# Patient Record
Sex: Male | Born: 1950 | Race: White | Hispanic: Refuse to answer | Marital: Single | Smoking: Current every day smoker
Health system: Southern US, Community
[De-identification: ages and names within clinical notes are randomized; demographics above are authoritative.]

## PROBLEM LIST (undated history)

## (undated) DIAGNOSIS — I4891 Unspecified atrial fibrillation: Secondary | ICD-10-CM

## (undated) DIAGNOSIS — K649 Unspecified hemorrhoids: Secondary | ICD-10-CM

## (undated) DIAGNOSIS — T783XXA Angioneurotic edema, initial encounter: Secondary | ICD-10-CM

## (undated) DIAGNOSIS — H8110 Benign paroxysmal vertigo, unspecified ear: Secondary | ICD-10-CM

## (undated) DIAGNOSIS — E785 Hyperlipidemia, unspecified: Secondary | ICD-10-CM

## (undated) DIAGNOSIS — M5430 Sciatica, unspecified side: Secondary | ICD-10-CM

## (undated) DIAGNOSIS — I2510 Atherosclerotic heart disease of native coronary artery without angina pectoris: Secondary | ICD-10-CM

## (undated) DIAGNOSIS — I10 Essential (primary) hypertension: Secondary | ICD-10-CM

## (undated) DIAGNOSIS — I219 Acute myocardial infarction, unspecified: Secondary | ICD-10-CM

## (undated) DIAGNOSIS — R0600 Dyspnea, unspecified: Secondary | ICD-10-CM

## (undated) HISTORY — DX: Unspecified hemorrhoids: K64.9

## (undated) HISTORY — PX: BACK SURGERY: SHX140

## (undated) HISTORY — DX: Sciatica, unspecified side: M54.30

## (undated) HISTORY — DX: Hyperlipidemia, unspecified: E78.5

## (undated) HISTORY — PX: SHOULDER SURGERY: SHX246

## (undated) HISTORY — DX: Benign paroxysmal vertigo, unspecified ear: H81.10

---

## 2003-06-08 ENCOUNTER — Emergency Department (HOSPITAL_COMMUNITY): Admission: EM | Admit: 2003-06-08 | Discharge: 2003-06-08 | Payer: Self-pay | Admitting: Emergency Medicine

## 2003-06-29 ENCOUNTER — Observation Stay (HOSPITAL_COMMUNITY): Admission: RE | Admit: 2003-06-29 | Discharge: 2003-06-30 | Payer: Self-pay | Admitting: Orthopedic Surgery

## 2004-11-10 ENCOUNTER — Encounter: Admission: RE | Admit: 2004-11-10 | Discharge: 2004-11-10 | Payer: Self-pay | Admitting: Orthopaedic Surgery

## 2004-12-09 ENCOUNTER — Encounter: Admission: RE | Admit: 2004-12-09 | Discharge: 2004-12-09 | Payer: Self-pay | Admitting: Family Medicine

## 2004-12-25 ENCOUNTER — Ambulatory Visit: Payer: Self-pay

## 2005-01-02 ENCOUNTER — Ambulatory Visit: Payer: Self-pay | Admitting: Internal Medicine

## 2005-01-14 ENCOUNTER — Ambulatory Visit (HOSPITAL_COMMUNITY): Admission: RE | Admit: 2005-01-14 | Discharge: 2005-01-14 | Payer: Self-pay | Admitting: Neurology

## 2005-02-27 ENCOUNTER — Encounter: Admission: RE | Admit: 2005-02-27 | Discharge: 2005-02-27 | Payer: Self-pay | Admitting: Internal Medicine

## 2005-04-02 ENCOUNTER — Inpatient Hospital Stay (HOSPITAL_COMMUNITY): Admission: RE | Admit: 2005-04-02 | Discharge: 2005-04-04 | Payer: Self-pay | Admitting: Orthopaedic Surgery

## 2009-06-03 ENCOUNTER — Ambulatory Visit: Payer: Self-pay | Admitting: Cardiology

## 2009-06-03 ENCOUNTER — Inpatient Hospital Stay (HOSPITAL_COMMUNITY): Admission: EM | Admit: 2009-06-03 | Discharge: 2009-06-08 | Payer: Self-pay | Admitting: Emergency Medicine

## 2009-06-06 ENCOUNTER — Encounter (INDEPENDENT_AMBULATORY_CARE_PROVIDER_SITE_OTHER): Payer: Self-pay | Admitting: Internal Medicine

## 2010-09-23 LAB — POCT I-STAT, CHEM 8
BUN: 11 mg/dL (ref 6–23)
Calcium, Ion: 1.14 mmol/L (ref 1.12–1.32)
Chloride: 103 mEq/L (ref 96–112)
Creatinine, Ser: 0.8 mg/dL (ref 0.4–1.5)
Glucose, Bld: 122 mg/dL — ABNORMAL HIGH (ref 70–99)
HCT: 47 % (ref 39.0–52.0)
Hemoglobin: 16 g/dL (ref 13.0–17.0)
Potassium: 3.1 mEq/L — ABNORMAL LOW (ref 3.5–5.1)
Sodium: 142 mEq/L (ref 135–145)
TCO2: 28 mmol/L (ref 0–100)

## 2010-09-23 LAB — BASIC METABOLIC PANEL
BUN: 9 mg/dL (ref 6–23)
CO2: 27 mEq/L (ref 19–32)
CO2: 28 mEq/L (ref 19–32)
Calcium: 8.5 mg/dL (ref 8.4–10.5)
Chloride: 107 mEq/L (ref 96–112)
Chloride: 109 mEq/L (ref 96–112)
Creatinine, Ser: 0.6 mg/dL (ref 0.4–1.5)
GFR calc Af Amer: >60 mL/min (ref >60)
GFR calc non Af Amer: >60 mL/min (ref >60)
Glucose, Bld: 102 mg/dL — ABNORMAL HIGH (ref 70–99)
Glucose, Bld: 104 mg/dL — ABNORMAL HIGH (ref 70–99)
Potassium: 3.3 mEq/L — ABNORMAL LOW (ref 3.5–5.1)
Potassium: 3.8 mEq/L (ref 3.5–5.1)
Sodium: 140 mEq/L (ref 135–145)
Sodium: 142 mEq/L (ref 135–145)

## 2010-09-23 LAB — CBC
HCT: 40.7 % (ref 39.0–52.0)
HCT: 42.6 % (ref 39.0–52.0)
HCT: 43.8 % (ref 39.0–52.0)
Hemoglobin: 13.8 g/dL (ref 13.0–17.0)
Hemoglobin: 14.5 g/dL (ref 13.0–17.0)
Hemoglobin: 14.8 g/dL (ref 13.0–17.0)
MCHC: 33.8 g/dL (ref 30.0–36.0)
MCHC: 33.8 g/dL (ref 30.0–36.0)
MCHC: 34 g/dL (ref 30.0–36.0)
MCV: 88.4 fL (ref 78.0–100.0)
MCV: 88.7 fL (ref 78.0–100.0)
MCV: 88.8 fL (ref 78.0–100.0)
Platelets: 218 10*3/uL (ref 150–400)
Platelets: 245 10*3/uL (ref 150–400)
RBC: 4.8 MIL/uL (ref 4.22–5.81)
RBC: 4.93 MIL/uL (ref 4.22–5.81)
RDW: 12.9 % (ref 11.5–15.5)
RDW: 13.1 % (ref 11.5–15.5)
RDW: 13.2 % (ref 11.5–15.5)
WBC: 7.4 10*3/uL (ref 4.0–10.5)
WBC: 8.4 10*3/uL (ref 4.0–10.5)

## 2010-09-23 LAB — CARDIAC PANEL(CRET KIN+CKTOT+MB+TROPI)
CK, MB: 1.3 ng/mL (ref 0.3–4.0)
CK, MB: 1.6 ng/mL (ref 0.3–4.0)
CK, MB: 1.7 ng/mL (ref 0.3–4.0)
Relative Index: INVALID (ref 0.0–2.5)
Relative Index: INVALID (ref 0.0–2.5)
Relative Index: INVALID (ref 0.0–2.5)
Total CK: 66 U/L (ref 7–232)
Total CK: 70 U/L (ref 7–232)
Total CK: 78 U/L (ref 7–232)
Troponin I: 0.01 ng/mL (ref 0.00–0.06)
Troponin I: 0.02 ng/mL (ref 0.00–0.06)
Troponin I: 0.02 ng/mL (ref 0.00–0.06)

## 2010-09-23 LAB — POCT CARDIAC MARKERS
CKMB, poc: 1.5 ng/mL (ref 1.0–8.0)
CKMB, poc: <1 ng/mL — ABNORMAL LOW (ref 1.0–8.0)
Myoglobin, poc: 63.6 ng/mL (ref 12–200)
Myoglobin, poc: 79 ng/mL (ref 12–200)
Troponin i, poc: <0.05 ng/mL (ref 0.00–0.09)
Troponin i, poc: <0.05 ng/mL (ref 0.00–0.09)

## 2010-09-23 LAB — LIPID PANEL
Cholesterol: 161 mg/dL (ref 0–200)
HDL: 44 mg/dL (ref >39)
LDL Cholesterol: 102 mg/dL — ABNORMAL HIGH (ref 0–99)
Total CHOL/HDL Ratio: 3.7 RATIO
Triglycerides: 74 mg/dL (ref <150)
VLDL: 15 mg/dL (ref 0–40)

## 2010-09-23 LAB — DIFFERENTIAL
Basophils Absolute: 0 10*3/uL (ref 0.0–0.1)
Basophils Relative: 0 % (ref 0–1)
Eosinophils Absolute: 0.2 10*3/uL (ref 0.0–0.7)
Eosinophils Relative: 2 % (ref 0–5)
Lymphocytes Relative: 7 % — ABNORMAL LOW (ref 12–46)
Lymphs Abs: 0.6 10*3/uL — ABNORMAL LOW (ref 0.7–4.0)
Monocytes Absolute: 0.4 10*3/uL (ref 0.1–1.0)
Monocytes Relative: 5 % (ref 3–12)
Neutro Abs: 7.2 10*3/uL (ref 1.7–7.7)
Neutrophils Relative %: 86 % — ABNORMAL HIGH (ref 43–77)

## 2010-09-23 LAB — ETHANOL: Alcohol, Ethyl (B): <5 mg/dL (ref 0–10)

## 2010-11-07 NOTE — H&P (Signed)
NAME:  Connor Martinez, Connor Martinez NO.:  [id number]   MEDICAL RECORD NO.:  [id number]          PATIENT TYPE:  AMB   LOCATION:  SDS                          FACILITY:  [facility]   PHYSICIAN:  Sharolyn Douglas, M.D.        DATE OF BIRTH:  1951-01-31   DATE OF ADMISSION:  04/02/2005  DATE OF DISCHARGE:                                HISTORY & PHYSICAL   This history and physical was performed in our office on April 01, 2005,  at which time the patient was fitted with a soft cervical collar, which will  be used postoperatively.   CHIEF COMPLAINT:  Pain and weakness in my left arm.Marland Kitchen   PRESENT ILLNESS:  This 60 year old white male was seen by [place] for continued  progressive problems concerning pain into his cervical spine, but most  significant to the patient weakness in his left upper extremity.  He has a  drop in the ring finger on the left hand, and interosseous muscles are  atrophied, as well as his triceps on the left.  He cannot extend the ring  finger of his left hand.  With high suspicious of a pancoast tumor, which  might be impinging upon the brachial plexus, he was sent to see Dr. Melbourne Abts  for full evaluation.  The EMGs Dr. Sandria Manly performed show cervical  radiculopathy without evidence of peripheral neuropathy.  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  After being cleared by Dr.  Sandria Manly, and all the risks and benefits of surgery described to the patient, it  was felt he would benefit from surgical intervention and being admitted for  anterior cervical diskectomy and fusion from C3 to C7.   PAST MEDICAL HISTORY:  The patient has been in relatively good health under  the care of Dr. Duaine Dredge.  He had a rotator cuff repair to the right  shoulder in January of 2005 by Dr. Simonne Come with excellent results.   FAMILY HISTORY:  Positive for heart disease and a stroke in his mother.   SOCIAL HISTORY:  The patient is single.  He is an Lexicographer.  He  smokes about a pack of cigarettes per day.  He has 1 daughter, and brothers  and daughter will be caregivers after surgery.   REVIEW OF SYSTEMS:  CNS:  No seizure, shoulder paralysis, double vision, but  the patient does have numbness, tingling, and weakness as mentioned above,  in his left upper extremity.  CARDIOVASCULAR:  No chest pain, no angina or  orthopnea.  RESPIRATORY:  No productive cough.  No hemoptysis.  No shortness  of breath.  GASTROINTESTINAL:  No nausea, vomiting, melena, or bloody  stools.  GENITOURINARY:  No discharge or hematuria.  MUSCULOSKELETAL:  Primarily in the present illness.   PHYSICAL EXAMINATION:  GENERAL:  Alert, cooperative, friendly 60 year old  white male.  VITAL SIGNS:  Blood pressure 120/80, pulse 74, respirations 20.  HEENT:  Normocephalic.  Pupils equal, round and reactive to light  and  accommodation.  Extraocular movements intact.  Oropharynx is clear.  CHEST:  Clear to auscultation.  No rhonchi, no rales, no wheezes.  HEART:  Regular rate and rhythm.  There was a grade 3/6 holosystolic murmur  heard across the chest.  ABDOMEN:  Soft, nontender.  Liver and spleen not felt.  GENITALIA/RECTAL:  Not done.  Not pertinent to present illness.  EXTREMITIES:  The patient does, indeed, have weakness in the left upper  extremity, as well as the ring finger.  He has hypoactive reflexes, as well.   ADMITTING DIAGNOSIS:  Cervical spondylotic radiculopathy.   PLAN:  The patient will undergo anterior cervical diskectomy and fusion at 4  levels, C3 through C7, with allograft and plate.  The patient has been  cleared preoperative by Dr. Duaine Dredge for this surgical procedure.  I  discussed with him at length about the benefits of cessation of smoking  during the healing phase, as there is a possibility of non-union of his  grafts should he continue the pack a day that he does at this  present time.      Dooley L. Cherlynn June.      Sharolyn Douglas, M.D.  Electronically Signed    DLU/MEDQ  D:  04/01/2005  T:  04/01/2005  Job:  [id number]   cc:   Mosetta Putt, M.D.  Fax: [phone]

## 2010-11-07 NOTE — Op Note (Signed)
NAME:  Connor Martinez, Connor Martinez NO.:  [id number]   MEDICAL RECORD NO.:  [id number]          PATIENT TYPE:  OIB   LOCATION:  5029                         FACILITY:  [facility]   PHYSICIAN:  Sharolyn Douglas, M.D.        DATE OF BIRTH:  1951/02/19   DATE OF PROCEDURE:  04/02/2005  DATE OF DISCHARGE:                                 OPERATIVE REPORT   DIAGNOSIS:  Cervical spondylotic radiculopathy.   PROCEDURE:  1.  Anterior cervical diskectomy, C3-4, C4-5, C5-6 and C6-7, with      decompression of the spinal cord and nerve roots bilaterally.  2.  Anterior cervical arthrodesis, 4 levels, C3 through C7, with placement      of 4 allograft prosthesis spacers packed with local autogenous bone      graft.  3.  Anterior cervical plating, C3 through C7, using the Abbott Spine System.   SURGEON:  Sharolyn Douglas, MD   ASSISTANT:  Verlin Fester, PA   ANESTHESIA:  General endotracheal.   ESTIMATED BLOOD LOSS:  Minimal.   COMPLICATIONS:  None.   INDICATIONS:  The patient is a pleasant 60 year old male with severe left  upper extremity radiculopathy and weakness.  His imaging studies show  multilevel disk disease with spinal stenosis and foraminal narrowing most  advanced, C3 through C7.  He has had an extensive workup, ruling out  extraspinal causes of his profound left upper extremity weakness and this  has been negative.  At this time, he presents for decompression and fusion  of his cervical spine in hopes of preventing further neurologic deficit and  improving his symptoms.  Risk and benefits were reviewed; he elected to  proceed.   PROCEDURE:  The patient was taken to the operating room, underwent general  endotracheal anesthesia without difficulty, given prophylactic IV  antibiotics.  Carefully positioned on the operating room table with his neck  in slight extension.  Five pounds of halter traction applied.  The neck was  prepped and draped in the usual sterile fashion.  An extensile  longitudinal  oblique incision was made along the medial border of the sternocleidomastoid  muscle.  Dissection was carried sharply through the platysma.  The interval  between the SCM and strap muscles was developed down to the prevertebral  space.  The anterior cervical spine was exposed.  A spinal needle was placed  to confirm location with intraoperative x-ray.  The longus coli muscle was  elevated out over the disk spaces of C3 through C7.  There were large  osteophytes deforming the anterior surface of the cervical spine.  The  esophagus, trachea and carotid sheath were identified and protected at all  times.  Deep Shadowline retractor placed.  The microscope was draped and  brought into the field.  Caspar distraction pins were placed into the C3, 4,  5, 6 and 7 vertebral bodies.  Starting at C6-7, gentle distraction was  applied on the Caspar distraction pins and radical diskectomy carried back  to the posterior longitudinal ligament.  The disk space was very  degenerative and the uncovertebral  joints were hypertrophied.  A high-speed  bur was used to take down the posterior vertebral margins and uncovertebral  joints.  The posterior longitudinal ligament was removed using the micro  Kerrisons.  A 6-mm allograft prosthesis spacer was then packed with local  bone graft obtained from the drill shavings and carefully countersunk into  the interspace 1 mm.  We then performed a similar procedure at C5-6, C4-5  and C3-4.  At the C3-4 level, there was a spondylolisthesis and at this  level, a 7-mm allograft prosthesis spacer was packed with bone graft and  inserted.  Hemostasis was achieved.  We then placed a 4-level anterior  cervical plate from C3 to C7 using 10 locking screws.  The 12-mm screws were  used in the 4 proximal holes. The 13-mm screws were used distally.  We had  excellent screw purchase.  We ensured that the locking mechanism engaged.  The wound was irrigated; the esophagus,  trachea and carotid sheath were  evaluated; there were no apparent injuries.  Deep Penrose drain was left in  place.  The platysma was closed with a running #2-0 Vicryl suture, the  subcutaneous layer closed with 3-0 Vicryl suture followed by a running 4-0  subcuticular Vicryl suture on the skin edges, benzoin and Steri-Strips  applied.  Sterile dressing placed.  The patient was placed into a cervical  collar, extubated without difficulty and transferred to Recovery in stable  condition, able to move his upper and lower extremities.      Sharolyn Douglas, M.D.  Electronically Signed     MC/MEDQ  D:  04/02/2005  T:  04/03/2005  Job:  [id number]

## 2010-11-07 NOTE — Op Note (Signed)
NAME:  Connor Martinez, Connor Martinez                           ACCOUNT NO.:  [id number]   MEDICAL RECORD NO.:  [id number]                   PATIENT TYPE:  AMB   LOCATION:  DAY                                  FACILITY:  [facility]   PHYSICIAN:  Marlowe Kays, M.D.               DATE OF BIRTH:  Feb 03, 1951   DATE OF PROCEDURE:  06/29/2003  DATE OF DISCHARGE:                                 OPERATIVE REPORT   PREOPERATIVE DIAGNOSIS:  Torn rotator cuff, right shoulder.   POSTOPERATIVE DIAGNOSIS:  Torn rotator cuff, right shoulder.   OPERATION:  Anterior acromionectomy, repair of torn rotator cuff, right  shoulder.   SURGEON:  Illene Labrador. [place], M.D.   ASSISTANT:  Mr. Idolina Primer, P.A.-C.   ANESTHESIA:  General.   PATHOLOGY AND JUSTIFICATION FOR PROCEDURE:  He had an injury to his right  arm on June 06, 2003, because of severe pain, and he was sent for an MRI  which demonstrated a 2.5 cm retracted full-thickness rotator cuff tear and  accordingly, he is here today for the above-mentioned surgery.  Operative  findings confirmed the MRI.   DESCRIPTION OF PROCEDURE:  Prophylactic antibiotics, satisfactory general  anesthesia, beach chair position on the Schlein frame.  The right shoulder  girdle was prepped with DuraPrep, draped in a sterile field, Ioban employed.  Vertical incision from roughly the [employer] joint down the greater tuberosity.  Fascia was opened in line with the skin incision.  Cut and cautery off the  anterior acromion to expose the [facility] joint which was identified with a Mellody Dance  needle.  With some difficulty, I was able to place a small Cobb elevator  beneath the anterior acromion.  He had a very severe impingement problem.  Followed this with a larger Cobb elevator and then protecting the underlying  structures, made my initial anterior acromionectomy with a micro saw.  I  then performed several other passes with the saw and small rongeur to  perform a wide decompression.  He also had a  very thick fascial layer  beneath this which was creating impingement problem as well.  This was  opened and partially excised.  The triangular-shaped tear was then  identified.  I roughened up the lateral humeral head area near the greater  tuberosity and placed two double-threaded Mitek anchors so that I was able  to get four good sutures through the full-thickness rotator cuff and with  the arm abducted, reattached it laterally.  I then supplemented this with  multiple interrupted #1 Ethibond sutures on the ends of the rotator cuff  laterally, so there was a nice smooth repair, doubly reinforced.  He had had  a partial result of an interscalene block preoperatively, and I supplemented  this with 0.5% Marcaine with adrenalin.  The deltoid muscle was  reapproximated with interrupted #1 Vicryl in two layers.  The fascia over  the anterior acromion with a good,  stable repair in one layer.  The  subcutaneous tissue was closed with 2-0 Vicryl, Steri-Strips on the skin,  dry sterile dressings applied followed by shoulder immobilizer.  He  tolerated the procedure well and was taken to the recovery room in  satisfactory condition with no known complications.                                              Marlowe Kays, M.D.   JA/MEDQ  D:  06/29/2003  T:  06/29/2003  Job:  [id number]

## 2011-04-13 ENCOUNTER — Other Ambulatory Visit: Payer: Self-pay | Admitting: Family Medicine

## 2011-04-13 DIAGNOSIS — R200 Anesthesia of skin: Secondary | ICD-10-CM

## 2011-04-13 DIAGNOSIS — M545 Low back pain: Secondary | ICD-10-CM

## 2011-04-17 ENCOUNTER — Ambulatory Visit
Admission: RE | Admit: 2011-04-17 | Discharge: 2011-04-17 | Disposition: A | Discharge status: Home / Self Care | Payer: 59 | Source: Ambulatory Visit | Attending: Family Medicine | Admitting: Family Medicine

## 2011-04-17 DIAGNOSIS — M545 Low back pain, unspecified: Secondary | ICD-10-CM

## 2011-04-17 DIAGNOSIS — R200 Anesthesia of skin: Secondary | ICD-10-CM

## 2011-04-17 MED ORDER — GADOBENATE DIMEGLUMINE 529 MG/ML IV SOLN
17.0000 mL | Freq: Once | INTRAVENOUS | Status: AC | PRN
  Administered 2011-04-17: 17 mL via INTRAVENOUS

## 2011-05-26 LAB — HM COLONOSCOPY

## 2015-11-26 ENCOUNTER — Encounter: Payer: Self-pay | Admitting: Internal Medicine

## 2015-11-26 ENCOUNTER — Ambulatory Visit (INDEPENDENT_AMBULATORY_CARE_PROVIDER_SITE_OTHER): Payer: 59 | Admitting: Internal Medicine

## 2015-11-26 VITALS — BP 220/124 | HR 95 | Temp 99.3°F | Resp 16 | Ht 68.0 in | Wt 222.0 lb

## 2015-11-26 DIAGNOSIS — Z0000 Encounter for general adult medical examination without abnormal findings: Secondary | ICD-10-CM

## 2015-11-26 DIAGNOSIS — Z1159 Encounter for screening for other viral diseases: Secondary | ICD-10-CM

## 2015-11-26 DIAGNOSIS — Z114 Encounter for screening for human immunodeficiency virus [HIV]: Secondary | ICD-10-CM

## 2015-11-26 DIAGNOSIS — F17200 Nicotine dependence, unspecified, uncomplicated: Secondary | ICD-10-CM | POA: Insufficient documentation

## 2015-11-26 DIAGNOSIS — I10 Essential (primary) hypertension: Secondary | ICD-10-CM | POA: Diagnosis not present

## 2015-11-26 DIAGNOSIS — E669 Obesity, unspecified: Secondary | ICD-10-CM

## 2015-11-26 MED ORDER — ASPIRIN EC 325 MG PO TBEC: 325.0000 mg | DELAYED_RELEASE_TABLET | Freq: Two times a day (BID) | ORAL | Status: DC

## 2015-11-26 MED ORDER — LISINOPRIL 10 MG PO TABS: 10.0000 mg | ORAL_TABLET | Freq: Every day | ORAL | Status: DC

## 2015-11-26 NOTE — Assessment & Plan Note (Signed)
Increase exercise, decrease portion Stressed importance of weight loss

## 2015-11-26 NOTE — Patient Instructions (Signed)
  Test(s) ordered today. Your results will be released to [place] (or called to you) after review, usually within 72hours after test completion. If any changes need to be made, you will be notified at that same time.   Medications reviewed and updated.  Changes include starting a blood pressure medication called lisinopril.     Your prescription(s) have been submitted to your pharmacy. Please take as directed and contact our office if you believe you are having problem(s) with the medication(s).   Please followup in 1-2 months

## 2015-11-26 NOTE — Assessment & Plan Note (Signed)
Not controlled Discussed that he needs medication - he is unsure what he has taken in the past Start lisinopril 10 mg daily, discussed possible side effect of cough Stressed regular exercise and weight loss Start monitoring his BP at home Check all basic labs Follow up in 1-2 months, sooner if needed

## 2015-11-26 NOTE — Assessment & Plan Note (Signed)
Stressed smoking cessation - he is working on it and knows how important it is to stop

## 2015-11-26 NOTE — Progress Notes (Signed)
Pre visit review using our clinic review tool, if applicable. No additional management support is needed unless otherwise documented below in the visit note. 

## 2015-11-26 NOTE — Progress Notes (Signed)
Subjective:    Patient ID: Connor Martinez, male    DOB: July 30, 1950, 65 y.o.   MRN: [id number]  HPI He is here to establish with a new pcp.     He takes two,sometimes more, aspirin a day for pain or "just to relax".  He is smoking and wants to quit.    Hypertension: He is not taking his medication daily. He does not think he needed it, which is why he stopped it.  He feels his BP is ok outside of a doctors office, but admits it has been this high when he has checked it on his own.  He is fairly compliant with a low sodium diet.  He denies chest pain, palpitations, shortness of breath and regular headaches. He is not exercising regularly.  He does not monitor his blood pressure at home.    Lump in right upper arm:  He denies pain.  He noticed it about 8 months.  He is unsure if it has gotten bigger.   Skin abnormality on left lower leg: he has had a skin abnormality for years.  It has not changed.  He does pick at it at times.    Medications and allergies reviewed with patient and updated if appropriate.  Patient Active Problem List   Diagnosis Date Noted  . Tobacco use disorder 11/26/2015  . Essential hypertension, benign 11/26/2015    No current outpatient prescriptions on file prior to visit.   No current facility-administered medications on file prior to visit.    Past Medical History  Diagnosis Date  . Hyperlipidemia     Past Surgical History  Procedure Laterality Date  . Back surgery    . Shoulder surgery      Right    Social History   Social History  . Marital Status: Divorced    Spouse Name: N/A  . Number of Children: N/A  . Years of Education: N/A   Social History Main Topics  . Smoking status: Current Every Day Smoker  . Smokeless tobacco: Never Used  . Alcohol Use: Yes  . Drug Use: No  . Sexual Activity: Not Asked   Other Topics Concern  . None   Social History Narrative  . None    Family History  Problem Relation Age of Onset  . Stroke  Mother   . Hypertension Mother   . Heart disease Mother   . Hepatitis C Brother     Review of Systems  Constitutional: Negative for fever and chills.  Eyes: Negative for visual disturbance.  Respiratory: Negative for cough, shortness of breath and wheezing.   Cardiovascular: Positive for leg swelling. Negative for chest pain and palpitations.  Gastrointestinal: Negative for nausea, abdominal pain, diarrhea, constipation and blood in stool.       No gerd, occasional hemorrhoidal bleeding  Genitourinary: Positive for frequency. Negative for dysuria, hematuria and difficulty urinating.  Skin: Positive for color change.  Neurological: Negative for dizziness, light-headedness and headaches.  Psychiatric/Behavioral: Negative for dysphoric mood. The patient is not nervous/anxious.        Objective:   Filed Vitals:   11/26/15 1424  BP: 220/124  Pulse: 95  Temp: 99.3 F (37.4 C)  Resp: 16   Filed Weights   11/26/15 1424  Weight: 222 lb (100.699 kg)   Body mass index is 33.76 kg/(m^2).   Physical Exam Constitutional: He appears well-developed and well-nourished. No distress.  HENT:  Head: Normocephalic and atraumatic.  Right Ear: External ear normal.  Left Ear: External ear normal.  Mouth/Throat: Oropharynx is clear and moist.  Normal ear canals and TM b/l  Eyes: Conjunctivae normal.  Neck: Neck supple. No tracheal deviation present. No thyromegaly present.  No carotid bruit  Cardiovascular: Normal rate, regular rhythm, normal heart sounds and intact distal pulses.   No murmur heard. Pulmonary/Chest: Effort normal and breath sounds normal. No respiratory distress. He has no wheezes. He has no rales.  Abdominal: Soft. Ventral hernia. obese. He exhibits no distension. There is no tenderness.  Musculoskeletal: He exhibits trace edema.  Lymphadenopathy:    He has no cervical adenopathy.  Skin: Skin is warm and dry. He is not diaphoretic. mobile, nontender lump in right upper  arm consistent with a lipoma, red, scarred area on left lower anterior leg Psychiatric: He has a normal mood and affect. His behavior is normal.        Assessment & Plan:   See Problem List for Assessment and Plan of chronic medical problems.   F/u in 1-2 months

## 2016-01-17 ENCOUNTER — Ambulatory Visit: Payer: 59 | Admitting: Internal Medicine

## 2016-02-07 ENCOUNTER — Ambulatory Visit: Payer: 59 | Admitting: Internal Medicine

## 2016-02-10 ENCOUNTER — Encounter: Payer: Self-pay | Admitting: Internal Medicine

## 2016-02-10 DIAGNOSIS — L409 Psoriasis, unspecified: Secondary | ICD-10-CM | POA: Insufficient documentation

## 2016-03-18 ENCOUNTER — Other Ambulatory Visit (INDEPENDENT_AMBULATORY_CARE_PROVIDER_SITE_OTHER): Payer: 59

## 2016-03-18 DIAGNOSIS — Z0000 Encounter for general adult medical examination without abnormal findings: Secondary | ICD-10-CM | POA: Diagnosis not present

## 2016-03-18 DIAGNOSIS — Z114 Encounter for screening for human immunodeficiency virus [HIV]: Secondary | ICD-10-CM | POA: Diagnosis not present

## 2016-03-18 DIAGNOSIS — Z1159 Encounter for screening for other viral diseases: Secondary | ICD-10-CM | POA: Diagnosis not present

## 2016-03-18 DIAGNOSIS — I10 Essential (primary) hypertension: Secondary | ICD-10-CM | POA: Diagnosis not present

## 2016-03-18 LAB — COMPREHENSIVE METABOLIC PANEL
ALBUMIN: 3.7 g/dL (ref 3.5–5.2)
ALK PHOS: 78 U/L (ref 39–117)
ALT: 7 U/L (ref 0–53)
AST: 10 U/L (ref 0–37)
BILIRUBIN TOTAL: 0.8 mg/dL (ref 0.2–1.2)
BUN: 12 mg/dL (ref 6–23)
CALCIUM: 9 mg/dL (ref 8.4–10.5)
CO2: 25 mEq/L (ref 19–32)
Chloride: 102 mEq/L (ref 96–112)
Creatinine, Ser: 0.77 mg/dL (ref 0.40–1.50)
GFR: 108.1 mL/min (ref >60.00)
Glucose, Bld: 82 mg/dL (ref 70–99)
POTASSIUM: 3.1 meq/L — AB (ref 3.5–5.1)
Sodium: 139 mEq/L (ref 135–145)
TOTAL PROTEIN: 7.4 g/dL (ref 6.0–8.3)

## 2016-03-18 LAB — LIPID PANEL
CHOLESTEROL: 234 mg/dL — AB (ref 0–200)
HDL: 38.7 mg/dL — AB (ref >39.00)
LDL Cholesterol: 177 mg/dL — ABNORMAL HIGH (ref 0–99)
NonHDL: 195.53
TRIGLYCERIDES: 92 mg/dL (ref 0.0–149.0)
Total CHOL/HDL Ratio: 6
VLDL: 18.4 mg/dL (ref 0.0–40.0)

## 2016-03-18 LAB — CBC WITH DIFFERENTIAL/PLATELET
Basophils Absolute: 0 10*3/uL (ref 0.0–0.1)
Basophils Relative: 0.2 % (ref 0.0–3.0)
EOS PCT: 3.6 % (ref 0.0–5.0)
Eosinophils Absolute: 0.3 10*3/uL (ref 0.0–0.7)
HEMATOCRIT: 43 % (ref 39.0–52.0)
HEMOGLOBIN: 14.7 g/dL (ref 13.0–17.0)
LYMPHS ABS: 1.6 10*3/uL (ref 0.7–4.0)
LYMPHS PCT: 20.1 % (ref 12.0–46.0)
MCHC: 34.2 g/dL (ref 30.0–36.0)
MCV: 82.3 fl (ref 78.0–100.0)
MONOS PCT: 8.6 % (ref 3.0–12.0)
Monocytes Absolute: 0.7 10*3/uL (ref 0.1–1.0)
NEUTROS PCT: 67.5 % (ref 43.0–77.0)
Neutro Abs: 5.3 10*3/uL (ref 1.4–7.7)
Platelets: 324 10*3/uL (ref 150.0–400.0)
RBC: 5.23 Mil/uL (ref 4.22–5.81)
RDW: 14.6 % (ref 11.5–15.5)
WBC: 7.8 10*3/uL (ref 4.0–10.5)

## 2016-03-18 LAB — TSH: TSH: 0.77 u[IU]/mL (ref 0.35–4.50)

## 2016-03-18 LAB — HEMOGLOBIN A1C: HEMOGLOBIN A1C: 5.4 % (ref 4.6–6.5)

## 2016-03-20 LAB — HEPATITIS C ANTIBODY: HCV Ab: NEGATIVE

## 2016-03-20 LAB — PSA, TOTAL AND FREE
PSA, % FREE: 15 % — AB (ref >25)
PSA, FREE: 0.5 ng/mL
PSA, Total: 3.4 ng/mL (ref <=4.0)

## 2016-03-20 LAB — HIV ANTIBODY (ROUTINE TESTING W REFLEX): HIV: NONREACTIVE

## 2016-03-22 DIAGNOSIS — E785 Hyperlipidemia, unspecified: Secondary | ICD-10-CM | POA: Insufficient documentation

## 2016-03-22 NOTE — Progress Notes (Signed)
Subjective:    Patient ID: Connor Martinez, male    DOB: 03/19/1951, 65 y.o.   MRN: [id number]  HPI The patient is here for follow up.  Hypertension: He is taking his medication, but missed it a couple of days. He is not compliant with a low sodium diet.  He denies chest pain, palpitations, edema, shortness of breath and regular headaches. He is not exercising regularly.  He does not monitor his blood pressure at home, but does have a machine at home.   Hyperlipidemia: He is not on any medication, but he thinks he was on one in the past. He is not compliant with a low fat/cholesterol diet. He is not exercising regularly.   Smoking:  He is smoking and at this time does not want to quit.  Eventually he wants to quit, but wants to make other changes in his life first.      Medications and allergies reviewed with patient and updated if appropriate.  Patient Active Problem List   Diagnosis Date Noted  . Hyperlipidemia 03/22/2016  . Psoriasis 02/10/2016  . Tobacco use disorder 11/26/2015  . Essential hypertension, benign 11/26/2015  . Obese 11/26/2015    Current Outpatient Prescriptions on File Prior to Visit  Medication Sig Dispense Refill  . aspirin EC 325 MG tablet Take 1 tablet (325 mg total) by mouth 2 (two) times daily. 30 tablet 0  . lisinopril (PRINIVIL,ZESTRIL) 10 MG tablet Take 1 tablet (10 mg total) by mouth daily. 90 tablet 3   No current facility-administered medications on file prior to visit.     Past Medical History:  Diagnosis Date  . BPV (benign positional vertigo)   . Hemorrhoids   . Hyperlipidemia   . 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 BACK SURGERY    . SHOULDER SURGERY     Right    Social History   Social History  . Marital status: Divorced    Spouse name: N/A  . Number of children: N/A  . Years of education: N/A   Social History Main  Topics  . Smoking status: Current Every Day Smoker  . Smokeless tobacco: Never Used  . Alcohol use Yes  . Drug use: No  . Sexual activity: Not on file   Other Topics Concern  . Not on file   Social History Narrative  . No narrative on file    Family History  Problem Relation Age of Onset  . Stroke Mother   . Hypertension Mother   . Heart disease Mother   . Hepatitis C Brother   . Hypertension Brother     Review of Systems  Respiratory: Positive for cough (smoking related) and wheezing (smoking related). Negative for shortness of breath.   Cardiovascular: Negative for chest pain, palpitations and leg swelling.  Gastrointestinal: Negative for nausea.       No gerd  Neurological: Negative for light-headedness.       Objective:   Vitals:   03/23/16 0914  BP: (!) 220/120  Pulse: 81  Resp: 16  Temp: 98.8 F (37.1 C)   Filed Weights   03/23/16 0914  Weight: 216 lb (98 kg)   Body mass index is 32.84 kg/m.   Physical Exam    Constitutional: Appears well-developed and well-nourished. No distress.  HENT:  Head: Normocephalic and atraumatic.  Neck: Neck supple. No tracheal deviation  present. No thyromegaly present.  No cervical lymphadenopathy Cardiovascular: Normal rate, regular irregular rhythm and normal heart sounds.   1/6 systolic murmur heard. No carotid bruit .  No edema Pulmonary/Chest: Effort normal and breath sounds normal. No respiratory distress. No has no wheezes. No rales.  Skin: Skin is warm and dry. Not diaphoretic. hypertropic lesion on left lower leg - concerning for skin cancer Psychiatric: Normal mood and affect. Behavior is normal.     Assessment & Plan:    See Problem List for Assessment and Plan of chronic medical problems.   F/u in one month

## 2016-03-22 NOTE — Patient Instructions (Addendum)
Take only aspirin 81 mg a day for prevention of a heart attack or stroke.    Start exercising and work on weight loss.  Improve your diet.  Work on quitting smoking.  All other Health Maintenance issues reviewed.   All recommended immunizations and age-appropriate screenings are up-to-date or discussed.  No immunizations administered today.   Medications reviewed and updated.  Changes include stop lisinopril 10 mg daily and start amlodipine 5 mg daily and lisinopril hctz 20-12.5 mg daily.  Monitor your BP at home - goal is less than 150/90.   Your prescription(s) have been submitted to your pharmacy. Please take as directed and contact our office if you believe you are having problem(s) with the medication(s).   Please followup in 1 month    Hypertension Hypertension, commonly called high blood pressure, is when the force of blood pumping through your arteries is too strong. Your arteries are the blood vessels that carry blood from your heart throughout your body. A blood pressure reading consists of a higher number over a lower number, such as 110/72. The higher number (systolic) is the pressure inside your arteries when your heart pumps. The lower number (diastolic) is the pressure inside your arteries when your heart relaxes. Ideally you want your blood pressure below 120/80. Hypertension forces your heart to work harder to pump blood. Your arteries may become narrow or stiff. Having untreated or uncontrolled hypertension can cause heart attack, stroke, kidney disease, and other problems. RISK FACTORS Some risk factors for high blood pressure are controllable. Others are not.  Risk factors you cannot control include:   Race. You may be at higher risk if you are African American.  Age. Risk increases with age.  Gender. Men are at higher risk than women before age 75 years. After age 80, women are at higher risk than men. Risk factors you can control include:  Not getting enough  exercise or physical activity.  Being overweight.  Getting too much fat, sugar, calories, or salt in your diet.  Drinking too much alcohol. SIGNS AND SYMPTOMS Hypertension does not usually cause signs or symptoms. Extremely high blood pressure (hypertensive crisis) may cause headache, anxiety, shortness of breath, and nosebleed. DIAGNOSIS To check if you have hypertension, your health care provider will measure your blood pressure while you are seated, with your arm held at the level of your heart. It should be measured at least twice using the same arm. Certain conditions can cause a difference in blood pressure between your right and left arms. A blood pressure reading that is higher than normal on one occasion does not mean that you need treatment. If it is not clear whether you have high blood pressure, you may be asked to return on a different day to have your blood pressure checked again. Or, you may be asked to monitor your blood pressure at home for 1 or more weeks. TREATMENT Treating high blood pressure includes making lifestyle changes and possibly taking medicine. Living a healthy lifestyle can help lower high blood pressure. You may need to change some of your habits. Lifestyle changes may include:  Following the DASH diet. This diet is high in fruits, vegetables, and whole grains. It is low in salt, red meat, and added sugars.  Keep your sodium intake below 2,300 mg per day.  Getting at least 30-45 minutes of aerobic exercise at least 4 times per week.  Losing weight if necessary.  Not smoking.  Limiting alcoholic beverages.  Learning ways to  reduce stress. Your health care provider may prescribe medicine if lifestyle changes are not enough to get your blood pressure under control, and if one of the following is true:  You are 66-39 years of age and your systolic blood pressure is above 140.  You are 23 years of age or older, and your systolic blood pressure is above  150.  Your diastolic blood pressure is above 90.  You have diabetes, and your systolic blood pressure is over [id number] or your diastolic blood pressure is over 90.  You have kidney disease and your blood pressure is above 140/90.  You have heart disease and your blood pressure is above 140/90. Your personal target blood pressure may vary depending on your medical conditions, your age, and other factors. HOME CARE INSTRUCTIONS  Have your blood pressure rechecked as directed by your health care provider.   Take medicines only as directed by your health care provider. Follow the directions carefully. Blood pressure medicines must be taken as prescribed. The medicine does not work as well when you skip doses. Skipping doses also puts you at risk for problems.  Do not smoke.   Monitor your blood pressure at home as directed by your health care provider. SEEK MEDICAL CARE IF:   You think you are having a reaction to medicines taken.  You have recurrent headaches or feel dizzy.  You have swelling in your ankles.  You have trouble with your vision. SEEK IMMEDIATE MEDICAL CARE IF:  You develop a severe headache or confusion.  You have unusual weakness, numbness, or feel faint.  You have severe chest or abdominal pain.  You vomit repeatedly.  You have trouble breathing. MAKE SURE YOU:   Understand these instructions.  Will watch your condition.  Will get help right away if you are not doing well or get worse.   This information is not intended to replace advice given to you by your health care provider. Make sure you discuss any questions you have with your health care provider.   Document Released: 06/08/2005 Document Revised: 10/23/2014 Document Reviewed: 03/31/2013 Elsevier Interactive Patient Education [employer].

## 2016-03-23 ENCOUNTER — Ambulatory Visit (INDEPENDENT_AMBULATORY_CARE_PROVIDER_SITE_OTHER): Payer: 59 | Admitting: Internal Medicine

## 2016-03-23 ENCOUNTER — Encounter: Payer: Self-pay | Admitting: Internal Medicine

## 2016-03-23 VITALS — BP 220/120 | HR 81 | Temp 98.8°F | Resp 16 | Wt 216.0 lb

## 2016-03-23 DIAGNOSIS — E6609 Other obesity due to excess calories: Secondary | ICD-10-CM | POA: Diagnosis not present

## 2016-03-23 DIAGNOSIS — L989 Disorder of the skin and subcutaneous tissue, unspecified: Secondary | ICD-10-CM | POA: Diagnosis not present

## 2016-03-23 DIAGNOSIS — I10 Essential (primary) hypertension: Secondary | ICD-10-CM | POA: Diagnosis not present

## 2016-03-23 DIAGNOSIS — E7800 Pure hypercholesterolemia, unspecified: Secondary | ICD-10-CM

## 2016-03-23 DIAGNOSIS — Z6830 Body mass index (BMI) 30.0-30.9, adult: Secondary | ICD-10-CM

## 2016-03-23 MED ORDER — ASPIRIN EC 81 MG PO TBEC: 81.0000 mg | DELAYED_RELEASE_TABLET | Freq: Every day | ORAL | Status: DC

## 2016-03-23 MED ORDER — LISINOPRIL-HYDROCHLOROTHIAZIDE 20-12.5 MG PO TABS: 1.0000 | ORAL_TABLET | Freq: Every day | ORAL | 3 refills | Status: DC

## 2016-03-23 MED ORDER — AMLODIPINE BESYLATE 5 MG PO TABS: 5.0000 mg | ORAL_TABLET | Freq: Every day | ORAL | 3 refills | Status: DC

## 2016-03-23 NOTE — Assessment & Plan Note (Signed)
Uncontrolled Missing some doses of his medication, poor lifestyle D/c lisinopril 10 mg daily Start amlodipine 5 mg daily and lisinopril - hctz 20-12.5 mg daily Stressed low salt diet, exercise, weight loss and smoking cessation Monitor BP at home - goal BP < 150/90 F/u in one month

## 2016-03-23 NOTE — Assessment & Plan Note (Signed)
Left leg Concern for skin cancer Referred to derm

## 2016-03-23 NOTE — Progress Notes (Signed)
Pre visit review using our clinic review tool, if applicable. No additional management support is needed unless otherwise documented below in the visit note. 

## 2016-03-23 NOTE — Assessment & Plan Note (Signed)
With htn and hyperlipidemia Stressed weight loss - exercise - walk daily and revise diet/decrease portions

## 2016-03-23 NOTE — Assessment & Plan Note (Signed)
Deferred statin -  Wants to work on lifestyle first Exercise, weight loss, improve diet Recheck lipid in a few months

## 2016-04-28 ENCOUNTER — Encounter: Payer: 59 | Admitting: Internal Medicine

## 2016-04-28 NOTE — Progress Notes (Signed)
    Subjective:    Patient ID: Connor Martinez, male    DOB: [birth year], 65 y.o.   MRN: [id number]  HPI  No show  Medications and allergies reviewed with patient and updated if appropriate.  Patient Active Problem List   Diagnosis Date Noted  . Skin abnormality 03/23/2016  . Hyperlipidemia 03/22/2016  . Psoriasis 02/10/2016  . Tobacco use disorder 11/26/2015  . Essential hypertension, benign 11/26/2015  . Obese 11/26/2015    Current Outpatient Prescriptions on File Prior to Visit  Medication Sig Dispense Refill  . amLODipine (NORVASC) 5 MG tablet Take 1 tablet (5 mg total) by mouth daily. 90 tablet 3  . aspirin EC 81 MG tablet Take 1 tablet (81 mg total) by mouth daily.    Marland Kitchen lisinopril-hydrochlorothiazide (ZESTORETIC) 20-12.5 MG tablet Take 1 tablet by mouth daily. 90 tablet 3  . [DISCONTINUED] lisinopril (PRINIVIL,ZESTRIL) 10 MG tablet Take 1 tablet (10 mg total) by mouth daily. 90 tablet 3   No current facility-administered medications on file prior to visit.     Past Medical History:  Diagnosis Date  . BPV (benign positional vertigo)   . Hemorrhoids   . Hyperlipidemia   . 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 BACK SURGERY    . SHOULDER SURGERY     Right    Social History   Social History  . Marital status: Divorced    Spouse name: N/A  . Number of children: N/A  . Years of education: N/A   Social History Main Topics  . Smoking status: Current Every Day Smoker  . Smokeless tobacco: Never Used  . Alcohol use Yes     Comment: infrequent  . Drug use:     Types: Marijuana     Comment: occasional  . Sexual activity: Not on file   Other Topics Concern  . Not on file   Social History Narrative  . No narrative on file    Family History  Problem Relation Age of Onset  . Stroke Mother   . Hypertension Mother   . Heart disease Mother   . Hepatitis C  Brother   . Hypertension Brother     Review of Systems     Objective:  There were no vitals filed for this visit. There were no vitals filed for this visit. There is no height or weight on file to calculate BMI.   Physical Exam       Assessment & Plan:    See Problem List for Assessment and Plan of chronic medical problems.    This encounter was created in error - please disregard.

## 2016-04-29 ENCOUNTER — Encounter: Payer: Self-pay | Admitting: Internal Medicine

## 2016-04-29 ENCOUNTER — Ambulatory Visit (INDEPENDENT_AMBULATORY_CARE_PROVIDER_SITE_OTHER): Payer: 59 | Admitting: Internal Medicine

## 2016-04-29 VITALS — BP 156/86 | HR 73 | Temp 98.4°F | Resp 18 | Wt 213.0 lb

## 2016-04-29 DIAGNOSIS — I10 Essential (primary) hypertension: Secondary | ICD-10-CM

## 2016-04-29 DIAGNOSIS — E7800 Pure hypercholesterolemia, unspecified: Secondary | ICD-10-CM | POA: Diagnosis not present

## 2016-04-29 MED ORDER — AMLODIPINE BESYLATE 10 MG PO TABS: 10.0000 mg | ORAL_TABLET | Freq: Every day | ORAL | 3 refills | Status: DC

## 2016-04-29 MED ORDER — ATORVASTATIN CALCIUM 20 MG PO TABS: 20.0000 mg | ORAL_TABLET | Freq: Every day | ORAL | 3 refills | Status: DC

## 2016-04-29 NOTE — Assessment & Plan Note (Addendum)
BP slightly elevated today Taking medication, not doing anything with his lifestyle - stressed low sodium diet, not exercising and still smoking Increase amlodipine to 10 mg daily Continue lisin-hctz at current dose

## 2016-04-29 NOTE — Progress Notes (Signed)
Subjective:    Patient ID: Connor Martinez, male    DOB: Sep 20, 1950, 65 y.o.   MRN: [id number]  HPI The patient is here for follow up.  Hypertension: He medication was changed at his last visit about one month ago.  He is taking his medication daily and he denies side effects. He is not compliant with a low sodium diet.  He denies chest pain, palpitations, edema, and regular headaches. He is not exercising regularly.  He does not monitor his blood pressure at home.    Hyperlipidemia: He has never been on medication. He is not compliant with a low fat/cholesterol diet. He is not exercising regularly.  He plans on changing his lifestyle after the new year.   Medications and allergies reviewed with patient and updated if appropriate.  Patient Active Problem List   Diagnosis Date Noted  . Skin abnormality 03/23/2016  . Hyperlipidemia 03/22/2016  . Psoriasis 02/10/2016  . Tobacco use disorder 11/26/2015  . Essential hypertension, benign 11/26/2015  . Obese 11/26/2015    Current Outpatient Prescriptions on File Prior to Visit  Medication Sig Dispense Refill  . amLODipine (NORVASC) 5 MG tablet Take 1 tablet (5 mg total) by mouth daily. 90 tablet 3  . aspirin EC 81 MG tablet Take 1 tablet (81 mg total) by mouth daily.    Marland Kitchen lisinopril-hydrochlorothiazide (ZESTORETIC) 20-12.5 MG tablet Take 1 tablet by mouth daily. 90 tablet 3  . [DISCONTINUED] lisinopril (PRINIVIL,ZESTRIL) 10 MG tablet Take 1 tablet (10 mg total) by mouth daily. 90 tablet 3   No current facility-administered medications on file prior to visit.     Past Medical History:  Diagnosis Date  . BPV (benign positional vertigo)   . Hemorrhoids   . Hyperlipidemia   . 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 BACK SURGERY    . SHOULDER SURGERY     Right    Social History   Social History  . Marital status: Divorced      Spouse name: N/A  . Number of children: N/A  . Years of education: N/A   Social History Main Topics  . Smoking status: Current Every Day Smoker  . Smokeless tobacco: Never Used  . Alcohol use Yes     Comment: infrequent  . Drug use:     Types: Marijuana     Comment: occasional  . Sexual activity: Not Asked   Other Topics Concern  . None   Social History Narrative  . None    Family History  Problem Relation Age of Onset  . Stroke Mother   . Hypertension Mother   . Heart disease Mother   . Hepatitis C Brother   . Hypertension Brother     Review of Systems  Constitutional: Negative for fever.  Respiratory: Positive for cough (from smoking), shortness of breath (from smoking) and wheezing (from smoking).   Cardiovascular: Negative for chest pain, palpitations and leg swelling.  Neurological: Negative for light-headedness and headaches.       Objective:   Vitals:   04/29/16 1326  BP: (!) 156/86  Pulse: 73  Resp: 18  Temp: 98.4 F (36.9 C)   Filed Weights   04/29/16 1326  Weight: 213 lb (96.6 kg)   Body mass index is 32.39 kg/m.   Physical Exam    Constitutional: Appears well-developed and well-nourished. No distress.  HENT:  Head: Normocephalic and atraumatic.  Neck: Neck supple. No tracheal deviation present. No thyromegaly present.  No cervical lymphadenopathy Cardiovascular: Normal rate, regular rhythm and normal heart sounds.   No murmur heard. No carotid bruit .  No edema Pulmonary/Chest: Effort normal and breath sounds normal. No respiratory distress. No has no wheezes. No rales.  Skin: Skin is warm and dry. Not diaphoretic.  Psychiatric: Normal mood and affect. Behavior is normal.      Assessment & Plan:    See Problem List for Assessment and Plan of chronic medical problems.

## 2016-04-29 NOTE — Progress Notes (Signed)
Pre visit review using our clinic review tool, if applicable. No additional management support is needed unless otherwise documented below in the visit note. 

## 2016-04-29 NOTE — Assessment & Plan Note (Addendum)
Start lipitor 20 mg daily Follow up in 3 months

## 2016-04-29 NOTE — Patient Instructions (Addendum)
   No immunizations administered today.   Medications reviewed and updated.  Changes include increasing amlodipine to 10 mg daily and starting generic lipitor 20 mg daily.  Your prescription(s) have been submitted to your pharmacy. Please take as directed and contact our office if you believe you are having problem(s) with the medication(s).   Please followup in 3 months

## 2016-11-21 ENCOUNTER — Emergency Department (HOSPITAL_COMMUNITY)
Admission: EM | Admit: 2016-11-21 | Discharge: 2016-11-21 | Disposition: A | Discharge status: Home / Self Care | Payer: 59 | Attending: Emergency Medicine | Admitting: Emergency Medicine

## 2016-11-21 ENCOUNTER — Other Ambulatory Visit: Payer: Self-pay | Admitting: Internal Medicine

## 2016-11-21 ENCOUNTER — Emergency Department (HOSPITAL_COMMUNITY): Payer: 59

## 2016-11-21 ENCOUNTER — Encounter (HOSPITAL_COMMUNITY): Payer: Self-pay

## 2016-11-21 DIAGNOSIS — F17210 Nicotine dependence, cigarettes, uncomplicated: Secondary | ICD-10-CM | POA: Diagnosis not present

## 2016-11-21 DIAGNOSIS — T783XXA Angioneurotic edema, initial encounter: Secondary | ICD-10-CM | POA: Insufficient documentation

## 2016-11-21 DIAGNOSIS — K1120 Sialoadenitis, unspecified: Secondary | ICD-10-CM

## 2016-11-21 DIAGNOSIS — Z7982 Long term (current) use of aspirin: Secondary | ICD-10-CM | POA: Insufficient documentation

## 2016-11-21 DIAGNOSIS — E785 Hyperlipidemia, unspecified: Secondary | ICD-10-CM | POA: Insufficient documentation

## 2016-11-21 DIAGNOSIS — I10 Essential (primary) hypertension: Secondary | ICD-10-CM | POA: Diagnosis not present

## 2016-11-21 DIAGNOSIS — Z79899 Other long term (current) drug therapy: Secondary | ICD-10-CM | POA: Diagnosis not present

## 2016-11-21 LAB — BASIC METABOLIC PANEL
Anion gap: 14 (ref 5–15)
BUN: 18 mg/dL (ref 6–20)
CALCIUM: 9 mg/dL (ref 8.9–10.3)
CO2: 23 mmol/L (ref 22–32)
Chloride: 101 mmol/L (ref 101–111)
Creatinine, Ser: 1.12 mg/dL (ref 0.61–1.24)
GLUCOSE: 146 mg/dL — AB (ref 65–99)
POTASSIUM: 3.1 mmol/L — AB (ref 3.5–5.1)
SODIUM: 138 mmol/L (ref 135–145)

## 2016-11-21 LAB — CBC WITH DIFFERENTIAL/PLATELET
BASOS PCT: 0 %
Basophils Absolute: 0 10*3/uL (ref 0.0–0.1)
EOS ABS: 0.3 10*3/uL (ref 0.0–0.7)
EOS PCT: 2 %
HCT: 42.9 % (ref 39.0–52.0)
Hemoglobin: 14.7 g/dL (ref 13.0–17.0)
Lymphocytes Relative: 18 %
Lymphs Abs: 2.4 10*3/uL (ref 0.7–4.0)
MCH: 28.4 pg (ref 26.0–34.0)
MCHC: 34.3 g/dL (ref 30.0–36.0)
MCV: 83 fL (ref 78.0–100.0)
MONO ABS: 0.9 10*3/uL (ref 0.1–1.0)
Monocytes Relative: 7 %
Neutro Abs: 9.5 10*3/uL — ABNORMAL HIGH (ref 1.7–7.7)
Neutrophils Relative %: 73 %
PLATELETS: 320 10*3/uL (ref 150–400)
RBC: 5.17 MIL/uL (ref 4.22–5.81)
RDW: 14.3 % (ref 11.5–15.5)
WBC: 13.2 10*3/uL — AB (ref 4.0–10.5)

## 2016-11-21 MED ORDER — DEXAMETHASONE SODIUM PHOSPHATE 10 MG/ML IJ SOLN
10.0000 mg | Freq: Once | INTRAMUSCULAR | Status: AC
  Administered 2016-11-21: 10 mg via INTRAVENOUS
  Filled 2016-11-21: qty 1

## 2016-11-21 MED ORDER — IOPAMIDOL (ISOVUE-300) INJECTION 61%
INTRAVENOUS | Status: AC
  Administered 2016-11-21: 75 mL
  Filled 2016-11-21: qty 75

## 2016-11-21 MED ORDER — CLINDAMYCIN HCL 300 MG PO CAPS: 300.0000 mg | ORAL_CAPSULE | Freq: Four times a day (QID) | ORAL | 0 refills | Status: DC

## 2016-11-21 MED ORDER — CLINDAMYCIN PHOSPHATE 600 MG/50ML IV SOLN
600.0000 mg | Freq: Once | INTRAVENOUS | Status: AC
  Administered 2016-11-21: 600 mg via INTRAVENOUS
  Filled 2016-11-21: qty 50

## 2016-11-21 MED ORDER — EPINEPHRINE PF 1 MG/10ML IJ SOSY
0.3000 mg | PREFILLED_SYRINGE | Freq: Once | INTRAMUSCULAR | Status: DC
  Filled 2016-11-21: qty 10

## 2016-11-21 MED ORDER — EPINEPHRINE 0.3 MG/0.3ML IJ SOAJ
INTRAMUSCULAR | Status: AC
  Filled 2016-11-21: qty 0.3

## 2016-11-21 MED ORDER — PREDNISONE 20 MG PO TABS: ORAL_TABLET | ORAL | 0 refills | Status: DC

## 2016-11-21 MED ORDER — EPINEPHRINE 0.3 MG/0.3ML IJ SOAJ: 0.3000 mg | Freq: Once | INTRAMUSCULAR | 0 refills | Status: AC

## 2016-11-21 NOTE — ED Provider Notes (Signed)
[facility] DEPT Provider Note   CSN: [id number] Arrival date & time: 11/21/16  0202   By signing my name below, I, Soijett Blue, attest that this documentation has been prepared under the direction and in the presence of Drenda Freeze, MD. Electronically Signed: Soijett Blue, ED Scribe. 11/21/16. 2:21 AM.  History   Chief Complaint Chief Complaint  Patient presents with  . Angioedema    HPI QUINTEN ALLERTON is a 66 y.o. male with a PMHx of HTN, who presents to the Emergency Department BIB [facility] complaining of tongue swelling onset 2 hours ago PTA. Pt reports associated neck fullness. Pt was given 0.3 mg epinephrine, 50 mg benadryl, and 125 mg solumedrol by EMS while en route to the ED. He notes that he has a hx of similar symptoms on 10/27/2016 and he wasn't evaluated int he ED for hsi symptoms. Pt reports that he takes lisinopril for his hx of HTN. He denies fever, chills, color change, wound, and any other symptoms.    The history is provided by the patient. No language interpreter was used.    Past Medical History:  Diagnosis Date  . BPV (benign positional vertigo)   . Hemorrhoids   . Hyperlipidemia   . Sciatica    MRI with disc herniations L3-4, L4-5 s/p epidural injections by Dr [name]    Patient Active Problem List   Diagnosis Date Noted  . Skin abnormality 03/23/2016  . Hyperlipidemia 03/22/2016  . Psoriasis 02/10/2016  . Tobacco use disorder 11/26/2015  . Essential hypertension, benign 11/26/2015  . Obese 11/26/2015    Past Surgical History:  Procedure Laterality Date  . BACK SURGERY    . SHOULDER SURGERY     Right       Home Medications    Prior to Admission medications   Medication Sig Start Date End Date Taking? Authorizing Provider  amLODipine (NORVASC) 10 MG tablet Take 1 tablet (10 mg total) by mouth daily. 04/29/16   [name], MD  aspirin EC 81 MG tablet Take 1 tablet (81 mg total) by mouth daily. 03/23/16   [name], MD  atorvastatin  (LIPITOR) 20 MG tablet Take 1 tablet (20 mg total) by mouth daily. 04/29/16   [name], MD  lisinopril-hydrochlorothiazide (ZESTORETIC) 20-12.5 MG tablet Take 1 tablet by mouth daily. 03/23/16   [name], MD    Family History Family History  Problem Relation Age of Onset  . Stroke Mother   . Hypertension Mother   . Heart disease Mother   . Hepatitis C Brother   . Hypertension Brother     Social History Social History  Substance Use Topics  . Smoking status: Current Every Day Smoker  . Smokeless tobacco: Never Used  . Alcohol use Yes     Comment: infrequent     Allergies   Ace inhibitors and Penicillins   Review of Systems Review of Systems  Constitutional: Negative for chills and fever.  HENT:       +tongue swelling  Skin: Negative for color change and wound.       +neck fullness  All other systems reviewed and are negative.    Physical Exam Updated Vital Signs BP 117/81   Pulse 98   Temp 98.7 F (37.1 C) (Oral)   Resp 18   Ht 5\' 8"  (1.727 m)   Wt 215 lb (97.5 kg)   SpO2 96%   BMI 32.69 kg/m   Physical Exam  Constitutional: He is  oriented to person, place, and time. He appears well-developed and well-nourished. No distress.  HENT:  Head: Normocephalic and atraumatic.  Angioedema to tongue. Difficulty visualizing posterior pharynx.  Eyes: EOM are normal.  Neck: Neck supple.  Soft tissue fullness to neck area.   Cardiovascular: Normal rate, regular rhythm and normal heart sounds.  Exam reveals no gallop and no friction rub.   No murmur heard. Pulmonary/Chest: Effort normal and breath sounds normal. No respiratory distress. He has no wheezes. He has no rales.  Abdominal: He exhibits no distension.  Musculoskeletal: Normal range of motion.  Neurological: He is alert and oriented to person, place, and time.  Skin: Skin is warm and dry.  Psychiatric: He has a normal mood and affect. His behavior is normal.  Nursing note and vitals  reviewed.    ED Treatments / Results  DIAGNOSTIC STUDIES: Oxygen Saturation is 96% on RA, nl by my interpretation.    COORDINATION OF CARE: 2:19 AM Discussed treatment plan with pt at bedside and pt agreed to plan.   Labs (all labs ordered are listed, but only abnormal results are displayed) Labs Reviewed  CBC WITH DIFFERENTIAL/PLATELET  BASIC METABOLIC PANEL    EKG  EKG Interpretation None       Radiology No results found.  Procedures Procedures (including critical care time)  Medications Ordered in ED Medications - No data to display   Initial Impression / Assessment and Plan / ED Course  I have reviewed the triage vital signs and the nursing notes.  Pertinent labs & imaging results that were available during my care of the patient were reviewed by me and considered in my medical decision making (see chart for details).    BLAYKE CORDREY is a 66 y.o. male here with tongue swelling, neck swelling. He is on lisinopril and had lip swelling 2 weeks ago but still taking it. Acute onset of swelling today. Has more swelling R neck so I consider ludwig in addition to angioedema. Given epi by EMS. Will give decadron, clinda. Will get CT neck.   5:38 AM CT showed R sialadenitis and also angioedema. Given clinda and decadron. I can visualize his posterior pharynx now. Breathing comfortably. Neck swelling improved. Will dc lisinopril. Will have him take prednisone, clinda. Will have him follow up with PCP, ENT. Recommend lemon drops.   Final Clinical Impressions(s) / ED Diagnoses   Final diagnoses:  None    New Prescriptions New Prescriptions   No medications on file   I personally performed the services described in this documentation, which was scribed in my presence. The recorded information has been reviewed and is accurate.     Drenda Freeze, MD 11/21/16 [phone]

## 2016-11-21 NOTE — Discharge Instructions (Signed)
Stop taking lisinopril.   Take prednisone as prescribed.   Take clindamycin as prescribed.   You have some inflammation around your salivary duct and there may be a stone there.   See ENT doctor and primary care doctor.   Carry EPI pen with you and give yourself a shot if you have trouble breathing, worse neck swelling.   Return to ER if you have worse neck swelling, trouble breathing, worse tongue swelling

## 2016-11-21 NOTE — ED Triage Notes (Signed)
Pt comes via [facility] for SOB and angioedema. Pt has severe swelling to tongue and lips, pt has had this reaction before. On ACE inhibitor. PTA received 0.3 EPI IM, 50 benadryl IV, 125 solumedrol

## 2016-12-03 ENCOUNTER — Ambulatory Visit (INDEPENDENT_AMBULATORY_CARE_PROVIDER_SITE_OTHER): Payer: 59 | Admitting: Internal Medicine

## 2016-12-03 ENCOUNTER — Encounter: Payer: Self-pay | Admitting: Internal Medicine

## 2016-12-03 VITALS — BP 170/84 | HR 85 | Temp 98.8°F | Resp 16 | Wt 214.0 lb

## 2016-12-03 DIAGNOSIS — T783XXD Angioneurotic edema, subsequent encounter: Secondary | ICD-10-CM

## 2016-12-03 DIAGNOSIS — I10 Essential (primary) hypertension: Secondary | ICD-10-CM | POA: Diagnosis not present

## 2016-12-03 DIAGNOSIS — M542 Cervicalgia: Secondary | ICD-10-CM | POA: Diagnosis not present

## 2016-12-03 DIAGNOSIS — E6609 Other obesity due to excess calories: Secondary | ICD-10-CM | POA: Diagnosis not present

## 2016-12-03 DIAGNOSIS — K1120 Sialoadenitis, unspecified: Secondary | ICD-10-CM | POA: Insufficient documentation

## 2016-12-03 DIAGNOSIS — F17200 Nicotine dependence, unspecified, uncomplicated: Secondary | ICD-10-CM | POA: Diagnosis not present

## 2016-12-03 DIAGNOSIS — T783XXA Angioneurotic edema, initial encounter: Secondary | ICD-10-CM | POA: Insufficient documentation

## 2016-12-03 DIAGNOSIS — Z6830 Body mass index (BMI) 30.0-30.9, adult: Secondary | ICD-10-CM

## 2016-12-03 MED ORDER — HYDROCHLOROTHIAZIDE 25 MG PO TABS: 25.0000 mg | ORAL_TABLET | Freq: Every day | ORAL | 1 refills | Status: DC

## 2016-12-03 NOTE — Assessment & Plan Note (Signed)
Asymptomatic Can eat sour candy and drinking plenty of fluids We'll refer to ENT if he becomes symptomatic

## 2016-12-03 NOTE — Assessment & Plan Note (Signed)
Stressed the importance of regular exercise, decreasing portions and healthy diet to help lose weight Reviewed some of the consequences of obesity

## 2016-12-03 NOTE — Progress Notes (Signed)
Subjective:    Patient ID: Connor Martinez, male    DOB: [birth year], 66 y.o.   MRN: [id number]  HPI The patient is here for follow up from the hospital.  He went to the ED 11/21/16 for tongue swelling for two hours.  He had fullness in his neck.  He was given epinephrine 0.3 mg, benadryl 50 mg and solumedrol 125 mg by EMS white en route to the ED.  He had similar symptoms on 10/27/16 and he did not go anywhere for evaluation.  He states he actually had several episodes of tongue and lip swelling over the past year or so.  He denied fever, chills.  He was on lisinopril for hypertension. In the ED his vitals were stable.  He had angioedema to the tongue, there was difficulty visualizing posterior pharynx and soft tissue fullness to the neck area.  He had a bmp and cbc done - he had mild hypokalemia and mild elevated WBC.   His lisinopril was stopped. He received decadron and clindamycin in the ED.  He had a CT of his neck, which showed R sialadenitis and angioedema.  His posterior pharynx was able to be visualized.  He was breathing comfortably and his neck swelling improved.  It was recommended he eat lemon drops and follow up with me and ENT.  CT neck with contrast  11/21/16: IMPRESSION: 1. Inflammatory change surrounding the right submandibular gland is suggestive of sialadenitis. No obstructing submandibular duct stone. 2. Mild-to-moderate edema of the tongue and mild edema of the adenoid and palatine tonsils without fluid collection or abscess may indicate angioedema.  He never got the prescriptions filled.  He did not know he was given the prescriptions.  He has not had any swelling in the lips, tongue or neck.    She denies any discomfort in his jaw area from the salivary gland stone.  Hypertension: He is taking his medication daily. He is compliant with a low sodium diet.  He denies chest pain, palpitations, edema, shortness of breath and lightheadedness. He is not exercising regularly.  He  does monitor his blood pressure at home - 170's/80's  He has right posterior neck pain.   This has gotten better today. It started a few days ago and is focused at the base of the skull and radiates up his head and down to his upper back. He describes it as a burning/numbness/tingling sensation. The  neck does feel stiff.  He has taken some aspirin and it has helped.   Medications and allergies reviewed with patient and updated if appropriate.  Patient Active Problem List   Diagnosis Date Noted  . Angioedema 12/03/2016  . Sialadenitis 12/03/2016  . Skin abnormality 03/23/2016  . Hyperlipidemia 03/22/2016  . Psoriasis 02/10/2016  . Tobacco use disorder 11/26/2015  . Essential hypertension, benign 11/26/2015  . Obese 11/26/2015    Current Outpatient Prescriptions on File Prior to Visit  Medication Sig Dispense Refill  . amLODipine (NORVASC) 10 MG tablet Take 1 tablet (10 mg total) by mouth daily. 90 tablet 3  . aspirin EC 81 MG tablet Take 1 tablet (81 mg total) by mouth daily.    Marland Kitchen atorvastatin (LIPITOR) 20 MG tablet Take 1 tablet (20 mg total) by mouth daily. 90 tablet 3  . [DISCONTINUED] lisinopril (PRINIVIL,ZESTRIL) 10 MG tablet Take 1 tablet (10 mg total) by mouth daily. 90 tablet 3   No current facility-administered medications on file prior to visit.     Past Medical  History:  Diagnosis Date  . BPV (benign positional vertigo)   . Hemorrhoids   . Hyperlipidemia   . 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 BACK SURGERY    . SHOULDER SURGERY     Right    Social History   Social History  . Marital status: Divorced    Spouse name: N/A  . Number of children: N/A  . Years of education: N/A   Social History Main Topics  . Smoking status: Current Every Day Smoker  . Smokeless tobacco: Never Used  . Alcohol use Yes     Comment: infrequent  . Drug use: Yes    Types:  Marijuana     Comment: occasional  . Sexual activity: Not on file   Other Topics Concern  . Not on file   Social History Narrative  . No narrative on file    Family History  Problem Relation Age of Onset  . Stroke Mother   . Hypertension Mother   . Heart disease Mother   . Hepatitis C Brother   . Hypertension Brother     Review of Systems  Constitutional: Negative for chills and fever.  HENT: Negative for sore throat and trouble swallowing.   Respiratory: Negative for cough, shortness of breath, wheezing and stridor.   Cardiovascular: Negative for chest pain, palpitations and leg swelling.  Musculoskeletal: Positive for neck pain (burning/numbness/tingling).  Neurological: Positive for headaches (right posterior head). Negative for light-headedness.       Objective:   Vitals:   12/03/16 1010  BP: (!) 170/84  Pulse: 85  Resp: 16  Temp: 98.8 F (37.1 C)   Wt Readings from Last 3 Encounters:  12/03/16 214 lb (97.1 kg)  11/21/16 215 lb (97.5 kg)  04/29/16 213 lb (96.6 kg)   Body mass index is 32.54 kg/m.   Physical Exam    Constitutional: Appears well-developed and well-nourished. No distress.  HENT:  Head: Normocephalic and atraumatic.  Neck: Neck supple. No tracheal deviation present. No thyromegaly present.  No cervical lymphadenopathy. No evidence of tongue, mouth, throat or neck swelling Cardiovascular: Normal rate, regular rhythm and normal heart sounds.   No murmur heard. No carotid bruit .  No edema Pulmonary/Chest: Effort normal and breath sounds normal. No respiratory distress. No has no wheezes. No rales.  Muscular: Mild tenderness right base of skull with palpation-reproduces symptoms-pain/burning/tingling of head and neck  Skin: Skin is warm and dry. Not diaphoretic.  Psychiatric: Normal mood and affect. Behavior is normal.      Assessment & Plan:    See Problem List for Assessment and Plan of chronic medical problems.

## 2016-12-03 NOTE — Patient Instructions (Addendum)
    Medications reviewed and updated.  Changes include starting hydrochlorothiazide 25 mg daily for your blood pressure.    Your prescription(s) have been submitted to your pharmacy. Please take as directed and contact our office if you believe you are having problem(s) with the medication(s).   Please followup in 3 weeks

## 2016-12-03 NOTE — Assessment & Plan Note (Signed)
Right-sided posterior neck pain with superficial pinched nerve Symptoms have improved today Can continue aspirin-short-term Massage, heat, stretch

## 2016-12-03 NOTE — Assessment & Plan Note (Signed)
Stressed smoking cessation He plans on quitting cold [place]

## 2016-12-03 NOTE — Assessment & Plan Note (Signed)
Resolved after treatment in the emergency room No recurrent symptoms No lisinopril, ARB

## 2016-12-03 NOTE — Assessment & Plan Note (Signed)
Blood pressure not controlled Continue amlodipine 10 mg daily Start hydrochlorothiazide 25 mg daily Most likely will need a third medication Monitor blood pressure at home Stressed exercise, weight loss and healthy diet Stressed smoking cessation Will check blood work at his next visit-may need potassium supplementation

## 2016-12-30 ENCOUNTER — Encounter: Payer: Self-pay | Admitting: Internal Medicine

## 2016-12-30 ENCOUNTER — Ambulatory Visit (INDEPENDENT_AMBULATORY_CARE_PROVIDER_SITE_OTHER): Payer: 59 | Admitting: Internal Medicine

## 2016-12-30 VITALS — BP 168/78 | HR 67 | Temp 98.1°F | Resp 16 | Wt 219.0 lb

## 2016-12-30 DIAGNOSIS — M542 Cervicalgia: Secondary | ICD-10-CM | POA: Diagnosis not present

## 2016-12-30 DIAGNOSIS — T783XXD Angioneurotic edema, subsequent encounter: Secondary | ICD-10-CM

## 2016-12-30 DIAGNOSIS — I10 Essential (primary) hypertension: Secondary | ICD-10-CM

## 2016-12-30 MED ORDER — SPIRONOLACTONE 25 MG PO TABS: 25.0000 mg | ORAL_TABLET | Freq: Every day | ORAL | 5 refills | Status: DC

## 2016-12-30 NOTE — Progress Notes (Signed)
Subjective:    Patient ID: Connor Martinez, male    DOB: 07/14/1950, 66 y.o.   MRN: [id number]  HPI The patient is here for follow up.  Needs cmp  Hypertension: He is taking his medication daily. He is not compliant with a low sodium diet.  He denies chest pain, palpitations, edema, shortness of breath and regular headaches. He is not exercising regularly.  He does monitor his blood pressure at home.    Smoking:  He is still smoking.  He does not desire to quit and has not tried to quit.    He is not eating healthy.  He eats too many salt and sugars.  He does not have the motivation to make changes.   His tongue swelled up the other day -  Only 1/2 of the tongue swelled up for 5-6 hours.  He took 2 benadryl.  He did not have any difficulty breathing.     Right sided neck pain:  He sleeps in a lounge chair.  His neck is more painful when he wakes up.  He has pain and stiffness on the right side of his neck. He takes Asprin as needed.    Medications and allergies reviewed with patient and updated if appropriate.  Patient Active Problem List   Diagnosis Date Noted  . Angioedema 12/03/2016  . Sialadenitis 12/03/2016  . Neck pain 12/03/2016  . Skin abnormality 03/23/2016  . Hyperlipidemia 03/22/2016  . Psoriasis 02/10/2016  . Tobacco use disorder 11/26/2015  . Essential hypertension, benign 11/26/2015  . Obese 11/26/2015    Current Outpatient Prescriptions on File Prior to Visit  Medication Sig Dispense Refill  . amLODipine (NORVASC) 10 MG tablet Take 1 tablet (10 mg total) by mouth daily. 90 tablet 3  . aspirin EC 81 MG tablet Take 1 tablet (81 mg total) by mouth daily.    Marland Kitchen atorvastatin (LIPITOR) 20 MG tablet Take 1 tablet (20 mg total) by mouth daily. 90 tablet 3  . hydrochlorothiazide (HYDRODIURIL) 25 MG tablet Take 1 tablet (25 mg total) by mouth daily. (Patient not taking: Reported on 12/30/2016) 90 tablet 1  . [DISCONTINUED] lisinopril (PRINIVIL,ZESTRIL) 10 MG tablet Take 1  tablet (10 mg total) by mouth daily. 90 tablet 3   No current facility-administered medications on file prior to visit.     Past Medical History:  Diagnosis Date  . BPV (benign positional vertigo)   . Hemorrhoids   . Hyperlipidemia   . 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 BACK SURGERY    . SHOULDER SURGERY     Right    Social History   Social History  . Marital status: Divorced    Spouse name: N/A  . Number of children: N/A  . Years of education: N/A   Social History Main Topics  . Smoking status: Current Every Day Smoker  . Smokeless tobacco: Never Used  . Alcohol use Yes     Comment: infrequent  . Drug use: Yes    Types: Marijuana     Comment: occasional  . Sexual activity: Not Asked   Other Topics Concern  . None   Social History Narrative  . None    Family History  Problem Relation Age of Onset  . Stroke Mother   . Hypertension Mother   . Heart disease Mother   . Hepatitis C Brother   .  Hypertension Brother     Review of Systems  Constitutional: Negative for chills and fever.  Respiratory: Negative for cough, shortness of breath and wheezing.   Cardiovascular: Negative for chest pain, palpitations and leg swelling.  Musculoskeletal: Positive for neck pain (right side) and neck stiffness.  Neurological: Negative for light-headedness and headaches.       Objective:   Vitals:   12/30/16 1119  BP: (!) 168/78  Pulse: 67  Resp: 16  Temp: 98.1 F (36.7 C)   Wt Readings from Last 3 Encounters:  12/30/16 219 lb (99.3 kg)  12/03/16 214 lb (97.1 kg)  11/21/16 215 lb (97.5 kg)   Body mass index is 33.3 kg/m.   Physical Exam    Constitutional: Appears well-developed and well-nourished. No distress.  HENT:  Head: Normocephalic and atraumatic.  Neck: Neck supple. No tracheal deviation present. No thyromegaly present.  No cervical  lymphadenopathy Cardiovascular: Normal rate, regular rhythm and normal heart sounds.   No murmur heard. No carotid bruit .  1 + b/l pitting edema Pulmonary/Chest: Effort normal and breath sounds normal. No respiratory distress. No has no wheezes. No rales.  Skin: Skin is warm and dry. Not diaphoretic.  Psychiatric: Normal mood and affect. Behavior is normal.      Assessment & Plan:    See Problem List for Assessment and Plan of chronic medical problems.

## 2016-12-30 NOTE — Progress Notes (Signed)
Subjective:    Patient ID: Connor Martinez, male    DOB: 08/04/1950, 66 y.o.   MRN: [id number]  HPI The patient is here for follow up.     Hypertension: He is taking his medication daily. He is not compliant with a low sodium diet.  He has edema in his ankles intermittently depending on what he eats.  He denies chest pain, palpitations, shortness of breath and regular headaches. He is not exercising regularly.  He does monitor his blood pressure at home.    Smoking:  He is still smoking.  He does not desire to quit and has not tried to quit.    He is not eating healthy.  He eats too much salt and sugars.  He does not have the motivation to make changes.   Angioedema:  His tongue swelled up the other day -  Only 1/2 of the tongue swelled up for 5-6 hours.  He took 2 benadryl.  He did not have any difficulty breathing.  This was the first occurrence since stopping the lisinopril.  He denies eating anything differently.    Right sided neck pain:  He sleeps in a lounge chair - he has for years.  He continues to have intermittent right sided neck pain.  His neck is more painful when he wakes up.  He has pain and stiffness on the right side of his neck.  He takes Asprin as needed and it does go away.     Medications and allergies reviewed with patient and updated if appropriate.  Patient Active Problem List   Diagnosis Date Noted  . Angioedema 12/03/2016  . Sialadenitis 12/03/2016  . Neck pain 12/03/2016  . Skin abnormality 03/23/2016  . Hyperlipidemia 03/22/2016  . Psoriasis 02/10/2016  . Tobacco use disorder 11/26/2015  . Essential hypertension, benign 11/26/2015  . Obese 11/26/2015    Current Outpatient Prescriptions on File Prior to Visit  Medication Sig Dispense Refill  . amLODipine (NORVASC) 10 MG tablet Take 1 tablet (10 mg total) by mouth daily. 90 tablet 3  . aspirin EC 81 MG tablet Take 1 tablet (81 mg total) by mouth daily.    Marland Kitchen atorvastatin (LIPITOR) 20 MG tablet Take 1  tablet (20 mg total) by mouth daily. 90 tablet 3  . hydrochlorothiazide (HYDRODIURIL) 25 MG tablet Take 1 tablet (25 mg total) by mouth daily. (Patient not taking: Reported on 12/30/2016) 90 tablet 1  . [DISCONTINUED] lisinopril (PRINIVIL,ZESTRIL) 10 MG tablet Take 1 tablet (10 mg total) by mouth daily. 90 tablet 3   No current facility-administered medications on file prior to visit.     Past Medical History:  Diagnosis Date  . BPV (benign positional vertigo)   . Hemorrhoids   . Hyperlipidemia   . 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 BACK SURGERY    . SHOULDER SURGERY     Right    Social History   Social History  . Marital status: Divorced    Spouse name: N/A  . Number of children: N/A  . Years of education: N/A   Social History Main Topics  . Smoking status: Current Every Day Smoker  . Smokeless tobacco: Never Used  . Alcohol use Yes     Comment: infrequent  . Drug use: Yes    Types: Marijuana     Comment: occasional  . Sexual activity:  Not Asked   Other Topics Concern  . None   Social History Narrative  . None    Family History  Problem Relation Age of Onset  . Stroke Mother   . Hypertension Mother   . Heart disease Mother   . Hepatitis C Brother   . Hypertension Brother     Review of Systems  Constitutional: Negative for chills and fever.  Respiratory: Negative for cough, shortness of breath and wheezing.   Cardiovascular: Positive for leg swelling (sometimes depending on what he eats). Negative for chest pain and palpitations.  Musculoskeletal: Positive for neck pain (right side) and neck stiffness.  Neurological: Negative for light-headedness and headaches.       Objective:   Vitals:   12/30/16 1119  BP: (!) 168/78  Pulse: 67  Resp: 16  Temp: 98.1 F (36.7 C)   Wt Readings from Last 3 Encounters:  12/30/16 219 lb (99.3 kg)  12/03/16  214 lb (97.1 kg)  11/21/16 215 lb (97.5 kg)   Body mass index is 33.3 kg/m.   Physical Exam    Constitutional: Appears well-developed and well-nourished. No distress.  HENT:  Head: Normocephalic and atraumatic.  Neck: Neck supple. No tracheal deviation present. No thyromegaly present.  No cervical lymphadenopathy Cardiovascular: Normal rate, regular rhythm and normal heart sounds.   No murmur heard. No carotid bruit .  No edema Pulmonary/Chest: Effort normal and breath sounds normal. No respiratory distress. No has no wheezes. No rales.  Skin: Skin is warm and dry. Not diaphoretic.  Psychiatric: Normal mood and affect. Behavior is normal.      Assessment & Plan:    See Problem List for Assessment and Plan of chronic medical problems.

## 2016-12-30 NOTE — Assessment & Plan Note (Signed)
Muscular in nature He did not want further evaluation Advised him to try to sleep in his bed Aspirin as needed Try heating pad, stretching

## 2016-12-30 NOTE — Patient Instructions (Addendum)
   Medications reviewed and updated.  Changes include starting spironolactone for your blood pressure.  Continue your other medications.   Your prescription(s) have been submitted to your pharmacy. Please take as directed and contact our office if you believe you are having problem(s) with the medication(s).  A referral for an allergist was ordered.    Please followup in 4 weeks

## 2016-12-30 NOTE — Assessment & Plan Note (Signed)
I prescribed hctz last visit and he did not get the prescription - was not at the pharmacy - he is only taking the norvasc  BP elevated, not controlled Start spironolactone, given low potassium over the years cmp at next visit Stressed lifestyle changes - low sodium diet, exercise and weight loss

## 2016-12-30 NOTE — Assessment & Plan Note (Signed)
He had another episode of tongue swelling No longer on ACE-I/ ARB ? Food related Will refer to allergy Benadryl as needed

## 2017-01-27 ENCOUNTER — Ambulatory Visit: Payer: 59 | Admitting: Internal Medicine

## 2017-03-09 ENCOUNTER — Ambulatory Visit (INDEPENDENT_AMBULATORY_CARE_PROVIDER_SITE_OTHER): Payer: Medicare Other | Admitting: Allergy and Immunology

## 2017-03-09 ENCOUNTER — Encounter: Payer: Self-pay | Admitting: Allergy and Immunology

## 2017-03-09 VITALS — BP 138/88 | HR 87 | Temp 98.0°F | Resp 19 | Ht 65.5 in | Wt 222.0 lb

## 2017-03-09 DIAGNOSIS — T783XXD Angioneurotic edema, subsequent encounter: Secondary | ICD-10-CM

## 2017-03-09 DIAGNOSIS — R05 Cough: Secondary | ICD-10-CM | POA: Diagnosis not present

## 2017-03-09 DIAGNOSIS — T783XXA Angioneurotic edema, initial encounter: Secondary | ICD-10-CM

## 2017-03-09 DIAGNOSIS — R059 Cough, unspecified: Secondary | ICD-10-CM

## 2017-03-09 DIAGNOSIS — F17210 Nicotine dependence, cigarettes, uncomplicated: Secondary | ICD-10-CM

## 2017-03-09 MED ORDER — EPINEPHRINE 0.3 MG/0.3ML IJ SOAJ: 0.3000 mg | Freq: Once | INTRAMUSCULAR | 2 refills | Status: AC

## 2017-03-09 NOTE — Progress Notes (Signed)
Dear Dr. [name],  Thank you for referring Connor Martinez to the [facility] on 03/09/2017.   Below is a summation of this patient's evaluation and recommendations.  Thank you for your referral. I will keep you informed about this patient's response to treatment.   If you have any questions please do not hesitate to contact me.   Sincerely,  Jiles Prows, MD Allergy / Immunology [place]   ______________________________________________________________________    NEW PATIENT NOTE  Referring Provider: [name], MD Primary Provider: [name], MD Date of office visit: 03/09/2017    Subjective:   Chief Complaint:  Connor Martinez (DOB: 24-Sep-1950) is a 66 y.o. male who presents to the clinic on 03/09/2017 with a chief complaint of Allergic Reaction .     HPI: Orby presents to this clinic in evaluation of angioedema involving his lips and tongue. Apparently his history started back in 2017 at which point in time he required hospitalization for a very significant episode of tongue and lip swelling while utilizing a ACE inhibitor. This ACE inhibitor was discontinued but even after discontinuation of this agent and without the use of a angiotensin receptor blocker he has had at least 2 episodes of lip and tongue swelling without any other associated systemic or constitutional symptoms that lasts approximately 6 hours without any obvious trigger.  He really does not have a atopic history. He did have a large local reaction to a insect sting about 10 years ago without associated systemic or constitutional symptoms. He does not really appear to have any significant chronic problems other than the fact that he has systemic arterial hypertension and has a chronic cough from smoking tobacco.  Past Medical History:  Diagnosis Date  . BPV (benign positional vertigo)   . Hemorrhoids   .  Hyperlipidemia   . 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 BACK SURGERY    . SHOULDER SURGERY     Right    Allergies as of 03/09/2017      Reactions   Ace Inhibitors Swelling   angioedema   Penicillins Rash      Medication List      amLODipine 10 MG tablet Commonly known as:  NORVASC Take 1 tablet (10 mg total) by mouth daily.   aspirin EC 81 MG tablet Take 1 tablet (81 mg total) by mouth daily.   atorvastatin 20 MG tablet Commonly known as:  LIPITOR Take 1 tablet (20 mg total) by mouth daily.   spironolactone 25 MG tablet Commonly known as:  ALDACTONE Take 1 tablet (25 mg total) by mouth daily.       Review of systems negative except as noted in HPI / PMHx or noted below:  Review of Systems  Constitutional: Negative.   HENT: Negative.   Eyes: Negative.   Respiratory: Negative.   Cardiovascular: Negative.   Gastrointestinal: Negative.   Genitourinary: Negative.   Musculoskeletal: Negative.   Skin: Negative.   Neurological: Negative.   Endo/Heme/Allergies: Negative.   Psychiatric/Behavioral: Negative.     Family History  Problem Relation Age of Onset  . Stroke Mother   . Hypertension Mother   . Heart disease Mother   . Hepatitis C Brother   . Hypertension Brother     Social History  Social History  . Marital status: Divorced    Spouse name: N/A  . Number of children: N/A  . Years of education: N/A   Occupational History  . Not on file.   Social History Main Topics  . Smoking status: Current Every Day Smoker  . Smokeless tobacco: Never Used  . Alcohol use Yes     Comment: infrequent  . Drug use: Yes    Types: Marijuana     Comment: occasional  . Sexual activity: Not on file   Other Topics Concern  . Not on file   Social History Narrative  . No narrative on file    Environmental and Social history  Lives in a house with a dry  environment, a cat located inside the household, hardwood in the bedroom, no plastic on the bed, no plastic on the pillow, actively smoking tobacco products,  Objective:   Vitals:   03/09/17 0805  BP: 138/88  Pulse: 87  Resp: 19  Temp: 98 F (36.7 C)  SpO2: 96%   Height: 5' 5.5" (166.4 cm) Weight: 222 lb (100.7 kg)  Physical Exam  Constitutional: He is well-developed, well-nourished, and in no distress.  HENT:  Head: Normocephalic. Head is without right periorbital erythema and without left periorbital erythema.  Right Ear: Tympanic membrane, external ear and ear canal normal.  Left Ear: Tympanic membrane, external ear and ear canal normal.  Nose: Nose normal. No mucosal edema or rhinorrhea.  Mouth/Throat: Oropharynx is clear and moist and mucous membranes are normal. No oropharyngeal exudate.  Eyes: Pupils are equal, round, and reactive to light. Conjunctivae and lids are normal.  Neck: Trachea normal. No tracheal deviation present. No thyromegaly present.  Cardiovascular: Normal rate, regular rhythm, S1 normal, S2 normal and normal heart sounds.   No murmur heard. Pulmonary/Chest: Effort normal. No stridor. No tachypnea. No respiratory distress. He has no wheezes. He has no rales. He exhibits no tenderness.  Abdominal: Soft. He exhibits no distension and no mass. There is no hepatosplenomegaly. There is no tenderness. There is no rebound and no guarding.  Musculoskeletal: He exhibits no edema or tenderness.  Lymphadenopathy:       Head (right side): No tonsillar adenopathy present.       Head (left side): No tonsillar adenopathy present.    He has no cervical adenopathy.    He has no axillary adenopathy.  Neurological: He is alert. Gait normal.  Skin: No rash noted. He is not diaphoretic. No erythema. No pallor. Nails show no clubbing.  Psychiatric: Mood and affect normal.    Diagnostics: Allergy skin tests were performed. He did not demonstrate any hypersensitivity against  a screening panel of aeroallergens or foods.  The patient had an Asthma Control Test with the following results:  .    Results of blood tests obtained 11/21/2016 identified normal renal function with a creatinine of 1.12, white blood cell count 13.2 with a absolute eosinophil count of 300, absolute lymphocyte count 2400, hemoglobin 14.7, platelet 320.  Results of blood tests obtained 03/18/2016 identified normal hepatic function with an AST of 10 and an ALT of 7 u/L respectively.  Results of a CT scan soft tissue of neck obtained 11/21/2016 identified the following:  Pharynx and larynx: The adenoid and palatine tonsils are mildly enlarged. There is no peritonsillar abscess or fluid collection. There is mild edema of the tongue. No focal oral cavity lesion is seen. The nasopharynx, oropharynx and hypopharynx are normal. The larynx is normal. No retropharyngeal abscess,  effusion or adenopathy. Salivary glands: There is fluid and edema surrounding the right submandibular gland. No sialolithiasis or evidence of ductal obstruction. The parotid glands and the sublingual glands are normal. Thyroid: Normal Lymph nodes: No enlarged or abnormal density cervical lymph nodes. Vascular: Major cervical vessels are patent. Limited intracranial: Normal Visualized orbits: Not imaged Mastoids and visualized paranasal sinuses: Clear Skeleton: Anterior cervical fusion hardware extends from C3-C7. Upper chest: Clear Other: None  Assessment and Plan:    1. Angioedema, initial encounter   2. Cough   3. Heavy tobacco smoker >10 cigarettes per day     1. Allergen avoidance measures?  2. Preventative plan for swelling disorder:   A. OTC loratadine 10 mg 2 tablets once a day  B. OTC ranitidine 150 mg 2 tablets once a day  3. AUVI-Q 0.3 / Benadryl, M.D./ER evaluation for allergic reaction  4. In investigation for swelling disorder:   A. Blood - tryptase, C1 esterase inhibitor level and  function, C1q, CH 50, CBC with differential, comprehensive metabolic panel, sed, CRP, alpha gal panel  B. chest x-ray for cough  5. Utilize nicotine substitutes to replace tobacco smoke exposure  6. Further evaluation and treatment? Yes if recurrent swelling  7. Return to clinic in 12 weeks or earlier if problem  8. 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 I have asked him to consistently use an H1 and H2 receptor blocker as preventative therapy and we have given him a epinephrine autoinjector device to use should he ever develops significant swelling. As well, I did have a talk with him today about the need to find a nicotine substitutes to replace his tobacco smoke exposure. I will regroup with him in 12 weeks or earlier if there is a problem.  Jiles Prows, MD Allergy / Immunology [facility]

## 2017-03-09 NOTE — Patient Instructions (Addendum)
  1. Allergen avoidance measures?  2. Preventative plan for swelling disorder:   A. OTC loratadine 10 mg 2 tablets once a day  B. OTC ranitidine 150 mg 2 tablets once a day  3. AUVI-Q 0.3 / Benadryl, M.D./ER evaluation for allergic reaction  4. In investigation for swelling disorder:   A. Blood - tryptase, C1 esterase inhibitor level and function, C1q, CH 50, CBC with differential, comprehensive metabolic panel, sed, CRP, alpha gal panel  B. chest x-ray for cough  5. Utilize nicotine substitutes to replace tobacco smoke exposure  6. Further evaluation and treatment? Yes if recurrent swelling  7. Return to clinic in 12 weeks or earlier if problem  8. Obtain fall flu vaccine

## 2017-03-16 LAB — COMPREHENSIVE METABOLIC PANEL
ALT: 12 IU/L (ref 0–44)
AST: 14 IU/L (ref 0–40)
Albumin/Globulin Ratio: 1.3 (ref 1.2–2.2)
Albumin: 4.1 g/dL (ref 3.6–4.8)
Alkaline Phosphatase: 101 IU/L (ref 39–117)
BUN/Creatinine Ratio: 16 (ref 10–24)
BUN: 11 mg/dL (ref 8–27)
Bilirubin Total: 0.3 mg/dL (ref 0.0–1.2)
CO2: 23 mmol/L (ref 20–29)
Calcium: 9.3 mg/dL (ref 8.6–10.2)
Chloride: 104 mmol/L (ref 96–106)
Creatinine, Ser: 0.69 mg/dL — ABNORMAL LOW (ref 0.76–1.27)
GFR calc Af Amer: 116 mL/min/{1.73_m2} (ref >59)
GFR calc non Af Amer: 100 mL/min/{1.73_m2} (ref >59)
Globulin, Total: 3.2 g/dL (ref 1.5–4.5)
Glucose: 112 mg/dL — ABNORMAL HIGH (ref 65–99)
Potassium: 3.4 mmol/L — ABNORMAL LOW (ref 3.5–5.2)
Sodium: 141 mmol/L (ref 134–144)
Total Protein: 7.3 g/dL (ref 6.0–8.5)

## 2017-03-16 LAB — ALPHA-GAL PANEL
Alpha Gal IgE*: <0.1 kU/L (ref <0.35)
Beef (Bos spp) IgE: <0.1 kU/L (ref <0.35)
Class Interpretation: 0
Class Interpretation: 0
Class Interpretation: 0
Lamb/Mutton (Ovis spp) IgE: <0.1 kU/L (ref <0.35)

## 2017-03-16 LAB — CBC WITH DIFFERENTIAL/PLATELET
Basophils Absolute: 0 10*3/uL (ref 0.0–0.2)
Basos: 0 %
EOS (ABSOLUTE): 0.2 10*3/uL (ref 0.0–0.4)
Eos: 2 %
Hematocrit: 43.4 % (ref 37.5–51.0)
Hemoglobin: 14.5 g/dL (ref 13.0–17.7)
Immature Grans (Abs): 0 10*3/uL (ref 0.0–0.1)
Immature Granulocytes: 0 %
Lymphocytes Absolute: 1.3 10*3/uL (ref 0.7–3.1)
Lymphs: 15 %
MCH: 28.1 pg (ref 26.6–33.0)
MCHC: 33.4 g/dL (ref 31.5–35.7)
MCV: 84 fL (ref 79–97)
Monocytes Absolute: 0.5 10*3/uL (ref 0.1–0.9)
Monocytes: 6 %
Neutrophils Absolute: 6.8 10*3/uL (ref 1.4–7.0)
Neutrophils: 77 %
Platelets: 358 10*3/uL (ref 150–379)
RBC: 5.16 x10E6/uL (ref 4.14–5.80)
RDW: 15.2 % (ref 12.3–15.4)
WBC: 8.9 10*3/uL (ref 3.4–10.8)

## 2017-03-16 LAB — TRYPTASE: Tryptase: 5 ug/L (ref 2.2–13.2)

## 2017-03-16 LAB — C-REACTIVE PROTEIN: CRP: 18.9 mg/L — ABNORMAL HIGH (ref 0.0–4.9)

## 2017-03-16 LAB — C1 ESTERASE INHIBITOR, FUNCTIONAL: C1INH Functional/C1INH Total MFr SerPl: 106 %{normal}

## 2017-03-16 LAB — COMPLEMENT COMPONENT C1Q: Complement C1Q: 15.9 mg/dL (ref 11.8–23.8)

## 2017-03-16 LAB — COMPLEMENT, TOTAL: Compl, Total (CH50): >60 U/mL (ref >41)

## 2017-03-16 LAB — C1 ESTERASE INHIBITOR: C1INH SerPl-mCnc: 33 mg/dL (ref 21–39)

## 2017-03-16 LAB — SEDIMENTATION RATE: Sed Rate: 36 mm/h — ABNORMAL HIGH (ref 0–30)

## 2017-03-19 ENCOUNTER — Telehealth: Payer: Self-pay | Admitting: Allergy and Immunology

## 2017-03-19 NOTE — Telephone Encounter (Signed)
Received fax that [facility] has been unable to reach them. I gave the patient the number for him to call them to set up delivery. He stated he will handle this today.

## 2017-03-23 ENCOUNTER — Ambulatory Visit: Payer: Self-pay | Admitting: Allergy and Immunology

## 2017-04-05 ENCOUNTER — Encounter (HOSPITAL_COMMUNITY): Payer: Self-pay | Admitting: *Deleted

## 2017-04-05 ENCOUNTER — Emergency Department (HOSPITAL_COMMUNITY)
Admission: EM | Admit: 2017-04-05 | Discharge: 2017-04-05 | Disposition: A | Discharge status: Home / Self Care | Payer: Medicare Other | Attending: Emergency Medicine | Admitting: Emergency Medicine

## 2017-04-05 DIAGNOSIS — T783XXA Angioneurotic edema, initial encounter: Secondary | ICD-10-CM | POA: Diagnosis present

## 2017-04-05 DIAGNOSIS — I10 Essential (primary) hypertension: Secondary | ICD-10-CM | POA: Insufficient documentation

## 2017-04-05 DIAGNOSIS — Z79899 Other long term (current) drug therapy: Secondary | ICD-10-CM | POA: Insufficient documentation

## 2017-04-05 DIAGNOSIS — Z7982 Long term (current) use of aspirin: Secondary | ICD-10-CM | POA: Diagnosis not present

## 2017-04-05 DIAGNOSIS — F17200 Nicotine dependence, unspecified, uncomplicated: Secondary | ICD-10-CM | POA: Diagnosis not present

## 2017-04-05 DIAGNOSIS — Z880 Allergy status to penicillin: Secondary | ICD-10-CM | POA: Insufficient documentation

## 2017-04-05 HISTORY — DX: Angioneurotic edema, initial encounter: T78.3XXA

## 2017-04-05 MED ORDER — DIPHENHYDRAMINE HCL 50 MG/ML IJ SOLN
25.0000 mg | Freq: Once | INTRAMUSCULAR | Status: AC
  Administered 2017-04-05: 25 mg via INTRAVENOUS
  Filled 2017-04-05: qty 1

## 2017-04-05 MED ORDER — EPINEPHRINE 0.3 MG/0.3ML IJ SOAJ: 0.3000 mg | Freq: Once | INTRAMUSCULAR | 1 refills | Status: DC | PRN

## 2017-04-05 MED ORDER — FAMOTIDINE IN NACL 20-0.9 MG/50ML-% IV SOLN
20.0000 mg | INTRAVENOUS | Status: AC
  Administered 2017-04-05: 20 mg via INTRAVENOUS
  Filled 2017-04-05: qty 50

## 2017-04-05 MED ORDER — PREDNISONE 20 MG PO TABS: ORAL_TABLET | ORAL | 0 refills | Status: DC

## 2017-04-05 NOTE — ED Notes (Signed)
Pt eating breakfast tray 

## 2017-04-05 NOTE — ED Provider Notes (Signed)
[facility] DEPT Provider Note   CSN: [id number] Arrival date & time: 04/05/17  0236     History   Chief Complaint Chief Complaint  Patient presents with  . Angioedema    HPI Connor Martinez is a 66 y.o. male.  The history is provided by the patient and medical records.    66 y.o. 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  Patient states first incident of this was in June of this year.  States initially they felt like it was due to lisinopril so took him off of this medication. Reports every month since then he has had an episode of this, usually within the first 2 weeks of the month.  States it varies what swells-- sometimes tongue, sometimes lips.  States he has been to an allergist for further testing of this without identifiable cause. States he thought it might of been honey, however did not eat any honey today. He denies any new medications, foods, or oral hygiene products. No fever or chills. No new rash (does have chronic rash of LLE for the past 7 years or so).  Patient was given Solu-Medrol, epinephrine, Atrovent, and albuterol in route to the ED with improvement of his symptoms. States now he is actually able to swallow.  Past Medical History:  Diagnosis Date  . Angioedema   . BPV (benign positional vertigo)   . Hemorrhoids   . Hyperlipidemia   . Sciatica    MRI with disc herniations L3-4, L4-5 s/p epidural injections by Dr [name]    Patient Active Problem List   Diagnosis Date Noted  . Angioedema 12/03/2016  . Sialadenitis 12/03/2016  . Neck pain 12/03/2016  . Skin abnormality 03/23/2016  . Hyperlipidemia 03/22/2016  . Psoriasis 02/10/2016  . Tobacco use disorder 11/26/2015  . Essential hypertension, benign 11/26/2015  . Obese 11/26/2015    Past Surgical History:  Procedure Laterality Date  . BACK SURGERY    . SHOULDER SURGERY     Right        Home Medications    Prior to Admission medications   Medication Sig Start Date End Date Taking? Authorizing Provider  amLODipine (NORVASC) 10 MG tablet Take 1 tablet (10 mg total) by mouth daily. 04/29/16   [name], MD  aspirin EC 81 MG tablet Take 1 tablet (81 mg total) by mouth daily. 03/23/16   [name], MD  atorvastatin (LIPITOR) 20 MG tablet Take 1 tablet (20 mg total) by mouth daily. 04/29/16   [name], MD  spironolactone (ALDACTONE) 25 MG tablet Take 1 tablet (25 mg total) by mouth daily. 12/30/16   [name], MD    Family History Family History  Problem Relation Age of Onset  . Stroke Mother   . Hypertension Mother   . Heart disease Mother   . Hepatitis C Brother   . Hypertension Brother     Social History Social History  Substance Use Topics  . Smoking status: Current Every Day Smoker  . Smokeless tobacco: Never Used  . Alcohol use Yes     Comment: infrequent     Allergies   Ace inhibitors and Penicillins   Review of Systems Review of Systems  HENT:       Angioedema  All other systems reviewed and are negative.    Physical Exam Updated Vital Signs BP (!) 157/68   Pulse  92   Temp 98.6 F (37 C)   Resp 16   SpO2 98%   Physical Exam  Constitutional: He is oriented to person, place, and time. He appears well-developed and well-nourished.  HENT:  Head: Normocephalic and atraumatic.  Mouth/Throat: Oropharynx is clear and moist.  Tongue is diffusely swollen, handling secretions well, able to talk but speech is somewhat difficulty to understand, no stridor, no lip or facial swelling  Eyes: Pupils are equal, round, and reactive to light. Conjunctivae and EOM are normal.  Neck: Normal range of motion.  Neck is supple without visible swelling  Cardiovascular: Normal rate, regular rhythm and normal heart sounds.   Pulmonary/Chest: Effort normal and breath sounds normal. No respiratory distress. He has no wheezes.  Abdominal:  Soft. Bowel sounds are normal. There is no tenderness. There is no rebound.  Musculoskeletal: Normal range of motion.  Neurological: He is alert and oriented to person, place, and time.  Skin: Skin is warm and dry.  Rash noted to LLE which appears chronic, skin is dry/scaly in this area, no signs of superimposed infection  Psychiatric: He has a normal mood and affect.  Nursing note and vitals reviewed.    ED Treatments / Results  Labs (all labs ordered are listed, but only abnormal results are displayed) Labs Reviewed - No data to display  EKG  EKG Interpretation None       Radiology No results found.  Procedures Procedures (including critical care time)  Medications Ordered in ED Medications  diphenhydrAMINE (BENADRYL) injection 25 mg (25 mg Intravenous Given 04/05/17 0252)  famotidine (PEPCID) IVPB 20 mg premix (0 mg Intravenous Stopped 04/05/17 0323)     Initial Impression / Assessment and Plan / ED Course  I have reviewed the triage vital signs and the nursing notes.  Pertinent labs & imaging results that were available during my care of the patient were reviewed by me and considered in my medical decision making (see chart for details).  66 year old male here with recurrent angioedema of unknown etiology. Not currently on an ACEI.  On exam he does have diffuse edema of the tongue.  He is handling his secretions well. His speech is somewhat hard to understand given the swelling of the tongue, but no stridor.  Airway is patent at this time.  Patient has already received epinephrine and solu-medrol.  Will give benadryl, pepcid.  Will need 4 hour obs.  Patient understands plan of care.  5:30 AM Swelling continues improving.  Patient has been tolerating fluids well.  Will continue to monitor.  Patient requested breakfast tray since he will require further observation-- this has been ordered.  VSS.  5:58 AM Patient still feeling well at this time.  Will need to be  monitored for another hour.  Care signed out to oncoming team at this time-- If continues improving and no worsening of symptoms, feel he can be discharged home.  Have written for steroid taper and epi pen.  He understands to follow-up closely with PCP and to return here if any new/worsening/recurrent symptoms.  Patient seen and evaluated with attending physician, Dr. Dina Rich, who agrees with assessment and plan of care.  Final Clinical Impressions(s) / ED Diagnoses   Final diagnoses:  Angioedema, initial encounter    New Prescriptions New Prescriptions   EPINEPHRINE (EPIPEN 2-PAK) 0.3 MG/0.3 ML IJ SOAJ INJECTION    Inject 0.3 mLs (0.3 mg total) into the muscle once as needed (for severe allergic reaction). CAll 911 immediately if you have  to use this medicine   PREDNISONE (DELTASONE) 20 MG TABLET    Take 40 mg by mouth daily for 3 days, then 20mg  by mouth daily for 3 days, then 10mg  daily for 3 days     Larene Pickett, PA-C 04/05/17 8588    Merryl Hacker, MD 04/09/17 [phone]

## 2017-04-05 NOTE — Discharge Instructions (Signed)
Keep an eye on your tongue. Take the prescribed medication as directed. If your swelling returns and you have trouble breathing/swallowing or have sensation of throat closing, use the epi pen.  If you use this, you have to come to the ED to be evaluated. Follow-up with your primary care doctor. Return to the ED for new or worsening symptoms.

## 2017-04-05 NOTE — ED Provider Notes (Signed)
Received signout the beginning of shift. Patient here with angioedema of the tongue from an unknown allergens. He has been monitored in the ER for the past 5 hours and is improving. Tongue is edematous on exam however patient states it has decreased in size. He is able to talk and swallow without difficulty. He is currently eating his breakfast. He is stable for discharge home with an EpiPen. Problem return precaution discussed.  BP 135/64   Pulse 68   Temp 98.6 F (37 C)   Resp (!) 25   SpO2 97%   Results for orders placed or performed in visit on 03/09/17  Tryptase  Result Value Ref Range   Tryptase 5.0 2.2 - 13.2 ug/L  C1 Esterase Inhibitor  Result Value Ref Range   C1INH SerPl-mCnc 33 21 - 39 mg/dL  C1 esterase inhibitor, functional  Result Value Ref Range   C1INH Functional/C1INH Total MFr SerPl 106 %mean normal  CBC with Differential/Platelet  Result Value Ref Range   WBC 8.9 3.4 - 10.8 x10E3/uL   RBC 5.16 4.14 - 5.80 x10E6/uL   Hemoglobin 14.5 13.0 - 17.7 g/dL   Hematocrit 43.4 37.5 - 51.0 %   MCV 84 79 - 97 fL   MCH 28.1 26.6 - 33.0 pg   MCHC 33.4 31.5 - 35.7 g/dL   RDW 15.2 12.3 - 15.4 %   Platelets 358 150 - 379 x10E3/uL   Neutrophils 77 Not Estab. %   Lymphs 15 Not Estab. %   Monocytes 6 Not Estab. %   Eos 2 Not Estab. %   Basos 0 Not Estab. %   Neutrophils Absolute 6.8 1.4 - 7.0 x10E3/uL   Lymphocytes Absolute 1.3 0.7 - 3.1 x10E3/uL   Monocytes Absolute 0.5 0.1 - 0.9 x10E3/uL   EOS (ABSOLUTE) 0.2 0.0 - 0.4 x10E3/uL   Basophils Absolute 0.0 0.0 - 0.2 x10E3/uL   Immature Granulocytes 0 Not Estab. %   Immature Grans (Abs) 0.0 0.0 - 0.1 x10E3/uL  Comprehensive metabolic panel  Result Value Ref Range   Glucose 112 (H) 65 - 99 mg/dL   BUN 11 8 - 27 mg/dL   Creatinine, Ser 0.69 (L) 0.76 - 1.27 mg/dL   GFR calc non Af Amer 100 >59 mL/min/1.73   GFR calc Af Amer 116 >59 mL/min/1.73   BUN/Creatinine Ratio 16 10 - 24   Sodium 141 134 - 144 mmol/L   Potassium 3.4  (L) 3.5 - 5.2 mmol/L   Chloride 104 96 - 106 mmol/L   CO2 23 20 - 29 mmol/L   Calcium 9.3 8.6 - 10.2 mg/dL   Total Protein 7.3 6.0 - 8.5 g/dL   Albumin 4.1 3.6 - 4.8 g/dL   Globulin, Total 3.2 1.5 - 4.5 g/dL   Albumin/Globulin Ratio 1.3 1.2 - 2.2   Bilirubin Total 0.3 0.0 - 1.2 mg/dL   Alkaline Phosphatase 101 39 - 117 IU/L   AST 14 0 - 40 IU/L   ALT 12 0 - 44 IU/L  Sedimentation rate  Result Value Ref Range   Sed Rate 36 (H) 0 - 30 mm/hr  C-reactive protein  Result Value Ref Range   CRP 18.9 (H) 0.0 - 4.9 mg/L  Complement component c1q  Result Value Ref Range   Complement C1Q 15.9 11.8 - 23.8 mg/dL  Complement, total  Result Value Ref Range   Compl, Total (CH50) >60 >41 U/mL  Alpha-Gal Panel  Result Value Ref Range   Beef (Bos spp) IgE <0.10 <0.35 kU/L  Class Interpretation 0    Lamb/Mutton (Ovis spp) IgE <0.10 <0.35 kU/L   Class Interpretation 0    Pork (Sus spp) IgE <0.10 <0.35 kU/L   Class Interpretation 0    Alpha Gal IgE* <0.10 <0.35 kU/L   No results found.    Domenic Moras, PA-C 04/05/17 5217    Merryl Hacker, MD 04/06/17 [phone]

## 2017-04-05 NOTE — ED Triage Notes (Signed)
Pt woke up this morning around midnight with difficulty swallowing. Pt noticed R side of his tongue was significantly larger than the L side. Hx of angioedema several times since June 2018. Was taking lisinopril but is no longer on medication. EMS reported wheezing throughout with worsening swelling to the tongue. Pt was given 0.3mg  epi, 5mg  albuterol, 0.5mg  atrovent, and 125mg  albuterol. Lung sounds clear on arrival, feels that swelling has decresed, is having difficulty swallowing and is spitting into an emesis bag

## 2017-04-09 ENCOUNTER — Other Ambulatory Visit: Payer: Self-pay | Admitting: Internal Medicine

## 2017-05-21 ENCOUNTER — Other Ambulatory Visit: Payer: Self-pay | Admitting: Internal Medicine

## 2017-05-21 ENCOUNTER — Telehealth: Payer: Self-pay | Admitting: Internal Medicine

## 2017-05-21 NOTE — Telephone Encounter (Signed)
Copied from [facility] [id number]. Topic: Quick Communication - See Telephone Encounter >> May 21, 2017  2:45 PM Synthia Innocent wrote: CRM for notification. See Telephone encounter for:  Requesting refills on atorvastatin (LIPITOR) 20 MG tablet  and amLODipine (NORVASC) 10 MG tablet . Please call into [facility] 05/21/17.

## 2017-05-24 NOTE — Telephone Encounter (Signed)
Called pt to schedule OV and to inform him he has refills to pick up (Atorvastatin and amlodipine). States he will call back to schedule

## 2017-06-01 NOTE — Progress Notes (Signed)
Subjective:    Patient ID: Connor Martinez, male    DOB: 1950-10-15, 66 y.o.   MRN: [id number]  HPI    Medications and allergies reviewed with patient and updated if appropriate.  Patient Active Problem List   Diagnosis Date Noted  . Angioedema 12/03/2016  . Sialadenitis 12/03/2016  . Neck pain 12/03/2016  . Skin abnormality 03/23/2016  . Hyperlipidemia 03/22/2016  . Psoriasis 02/10/2016  . Tobacco use disorder 11/26/2015  . Essential hypertension, benign 11/26/2015  . Obese 11/26/2015    Current Outpatient Medications on File Prior to Visit  Medication Sig Dispense Refill  . amLODipine (NORVASC) 10 MG tablet Take 1 tablet (10 mg total) by mouth daily. -- Office visit needed for further refills 30 tablet 0  . atorvastatin (LIPITOR) 20 MG tablet Take 1 tablet (20 mg total) by mouth daily. -- Office visit needed for further refills 30 tablet 0  . EPINEPHrine (EPIPEN 2-PAK) 0.3 mg/0.3 mL IJ SOAJ injection Inject 0.3 mLs (0.3 mg total) into the muscle once as needed (for severe allergic reaction). CAll 911 immediately if you have to use this medicine 1 Device 1  . predniSONE (DELTASONE) 20 MG tablet Take 40 mg by mouth daily for 3 days, then 20mg  by mouth daily for 3 days, then 10mg  daily for 3 days 12 tablet 0  . [DISCONTINUED] lisinopril (PRINIVIL,ZESTRIL) 10 MG tablet Take 1 tablet (10 mg total) by mouth daily. 90 tablet 3   No current facility-administered medications on file prior to visit.     Past Medical History:  Diagnosis Date  . Angioedema   . BPV (benign positional vertigo)   . Hemorrhoids   . Hyperlipidemia   . 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 BACK SURGERY    . SHOULDER SURGERY     Right    Social History   Socioeconomic History  . Marital status: Divorced    Spouse name: Not on file  . Number of children: Not on file  . Years of education: Not  on file  . Highest education level: Not on file  Social Needs  . Financial resource strain: Not on file  . Food insecurity - worry: Not on file  . Food insecurity - inability: Not on file  . Transportation needs - medical: Not on file  . Transportation needs - non-medical: Not on file  Occupational History  . Not on file  Tobacco Use  . Smoking status: Current Every Day Smoker  . Smokeless tobacco: Never Used  Substance and Sexual Activity  . Alcohol use: Yes    Comment: infrequent  . Drug use: Yes    Types: Marijuana    Comment: occasional  . Sexual activity: Not on file  Other Topics Concern  . Not on file  Social History Narrative  . Not on file    Family History  Problem Relation Age of Onset  . Stroke Mother   . Hypertension Mother   . Heart disease Mother   . Hepatitis C Brother   . Hypertension Brother     Review of Systems     Objective:  There were no vitals filed for this visit. Wt Readings from Last 3 Encounters:  03/09/17 222 lb (100.7 kg)  12/30/16 219 lb (99.3 kg)  12/03/16 214 lb (97.1 kg)   There is no height or weight on file  to calculate BMI.   Physical Exam          Assessment & Plan:    See Problem List for Assessment and Plan of chronic medical problems.   This encounter was created in error - please disregard.

## 2017-06-04 ENCOUNTER — Encounter: Payer: Medicare Other | Admitting: Internal Medicine

## 2017-06-24 NOTE — Progress Notes (Signed)
Subjective:    Patient ID: Connor Martinez, male    DOB: 1950/12/30, 67 y.o.   MRN: [id number]  HPI The patient is here for follow up.  Lip swelling:  He has lip swelling - he has numerous episodes.  They started in 2017.  His last episode was the end of December.  He was taking amlodipine and atorvastatin, but stopped both after his last episode.  He did see allergy.  He denies any good that may have caused it.   Hyperlipidemia: He is taking his medication daily. He is compliant with a low fat/cholesterol diet. He is not exercising regularly. He denies myalgias.   Hypertension: He is taking his medication daily. He is compliant with a low sodium diet.  He denies chest pain, palpitations, edema, shortness of breath and regular headaches. He is not exercising regularly.  He does not monitor his blood pressure at home.    Rash:  Left lower leg:  It is getting larger and it itches.  He has been applying benadryl cream and neosporin - they have not helped.  He saw Dr Allyson Sabal and he had allergy testing.  He had a skin biopsy and it was negative for cancer.  The rash has increased in size significantly.  He has fullness sensation/ hurricane sensation under right ear - this occurs just before his lip swelling.  He denies symptoms now.    Medications and allergies reviewed with patient and updated if appropriate.  Patient Active Problem List   Diagnosis Date Noted  . Angioedema 12/03/2016  . Sialadenitis 12/03/2016  . Neck pain 12/03/2016  . Skin abnormality 03/23/2016  . Hyperlipidemia 03/22/2016  . Psoriasis 02/10/2016  . Tobacco use disorder 11/26/2015  . Essential hypertension, benign 11/26/2015  . Obese 11/26/2015    Current Outpatient Medications on File Prior to Visit  Medication Sig Dispense Refill  . EPINEPHrine (EPIPEN 2-PAK) 0.3 mg/0.3 mL IJ SOAJ injection Inject 0.3 mLs (0.3 mg total) into the muscle once as needed (for severe allergic reaction). CAll 911 immediately if you  have to use this medicine 1 Device 1  . amLODipine (NORVASC) 10 MG tablet Take 1 tablet (10 mg total) by mouth daily. -- Office visit needed for further refills (Patient not taking: Reported on 06/25/2017) 30 tablet 0  . atorvastatin (LIPITOR) 20 MG tablet Take 1 tablet (20 mg total) by mouth daily. -- Office visit needed for further refills (Patient not taking: Reported on 06/25/2017) 30 tablet 0  . [DISCONTINUED] lisinopril (PRINIVIL,ZESTRIL) 10 MG tablet Take 1 tablet (10 mg total) by mouth daily. 90 tablet 3   No current facility-administered medications on file prior to visit.     Past Medical History:  Diagnosis Date  . Angioedema   . BPV (benign positional vertigo)   . Hemorrhoids   . Hyperlipidemia   . 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 BACK SURGERY    . SHOULDER SURGERY     Right    Social History   Socioeconomic History  . Marital status: Divorced    Spouse name: None  . Number of children: None  . Years of education: None  . Highest education level: None  Social Needs  . Financial resource strain: None  . Food insecurity - worry: None  . Food insecurity - inability: None  . Transportation needs - medical: None  .  Transportation needs - non-medical: None  Occupational History  . None  Tobacco Use  . Smoking status: Current Every Day Smoker  . Smokeless tobacco: Never Used  Substance and Sexual Activity  . Alcohol use: Yes    Comment: infrequent  . Drug use: Yes    Types: Marijuana    Comment: occasional  . Sexual activity: None  Other Topics Concern  . None  Social History Narrative  . None    Family History  Problem Relation Age of Onset  . Stroke Mother   . Hypertension Mother   . Heart disease Mother   . Hepatitis C Brother   . Hypertension Brother     Review of Systems  Constitutional: Negative for chills and fever.  Respiratory:  Positive for cough (cigarette related), shortness of breath (cigarette related) and wheezing (cigarette related).   Cardiovascular: Positive for leg swelling (salt intake). Negative for chest pain and palpitations.  Neurological: Positive for headaches (with stress only). Negative for light-headedness.       Objective:   Vitals:   06/25/17 1409  BP: (!) 194/102  Pulse: 84  Resp: 16  Temp: 99.4 F (37.4 C)  SpO2: 98%   Wt Readings from Last 3 Encounters:  06/25/17 216 lb (98 kg)  03/09/17 222 lb (100.7 kg)  12/30/16 219 lb (99.3 kg)   Body mass index is 35.4 kg/m.   Physical Exam    Constitutional: Appears well-developed and well-nourished. No distress.  HENT:  Head: Normocephalic and atraumatic.  Neck: Neck supple. No tracheal deviation present. No thyromegaly present.  No cervical lymphadenopathy Cardiovascular: Normal rate, regular rhythm and normal heart sounds.   No murmur heard. No carotid bruit .  No edema Pulmonary/Chest: Effort normal and breath sounds normal. No respiratory distress. No has no wheezes. No rales.  Skin: Skin is warm and dry. Not diaphoretic. erythematous patchy rash on RLE that appears like folliculitis Psychiatric: Normal mood and affect. Behavior is normal.      Assessment & Plan:    See Problem List for Assessment and Plan of chronic medical problems.

## 2017-06-25 ENCOUNTER — Encounter: Payer: Self-pay | Admitting: Internal Medicine

## 2017-06-25 ENCOUNTER — Other Ambulatory Visit: Payer: Self-pay | Admitting: Internal Medicine

## 2017-06-25 ENCOUNTER — Ambulatory Visit (INDEPENDENT_AMBULATORY_CARE_PROVIDER_SITE_OTHER): Payer: Medicare HMO | Admitting: Internal Medicine

## 2017-06-25 VITALS — BP 194/102 | HR 84 | Temp 99.4°F | Resp 16 | Wt 216.0 lb

## 2017-06-25 DIAGNOSIS — I10 Essential (primary) hypertension: Secondary | ICD-10-CM | POA: Diagnosis not present

## 2017-06-25 DIAGNOSIS — E7849 Other hyperlipidemia: Secondary | ICD-10-CM | POA: Diagnosis not present

## 2017-06-25 DIAGNOSIS — L739 Follicular disorder, unspecified: Secondary | ICD-10-CM | POA: Diagnosis not present

## 2017-06-25 DIAGNOSIS — T783XXD Angioneurotic edema, subsequent encounter: Secondary | ICD-10-CM | POA: Diagnosis not present

## 2017-06-25 MED ORDER — MUPIROCIN 2 % EX OINT: 1.0000 "application " | TOPICAL_OINTMENT | Freq: Two times a day (BID) | CUTANEOUS | 0 refills | Status: DC

## 2017-06-25 MED ORDER — NEBIVOLOL HCL 10 MG PO TABS
10.0000 mg | ORAL_TABLET | Freq: Every day | ORAL | 5 refills | Status: DC
Start: 2017-06-25 — End: 2017-08-05

## 2017-06-25 MED ORDER — CLINDAMYCIN PHOSPHATE 1 % EX GEL: Freq: Two times a day (BID) | CUTANEOUS | 0 refills | Status: DC

## 2017-06-25 NOTE — Patient Instructions (Addendum)
  Medications reviewed and updated.  Changes include stopping amlodipine and starting bystolic 10 mg daily - call if it is too expensive.  Apply two different creams on your leg rash.     Your prescription(s) have been submitted to your pharmacy. Please take as directed and contact our office if you believe you are having problem(s) with the medication(s).   Please followup in 3 weeks

## 2017-06-26 NOTE — Assessment & Plan Note (Signed)
Rash consistent with folliculitis - has seen derm in the past - biopsy neg - it has grown significantly since then  Will treat with topical bactroban and clindamycin Can try a topical steroid if no improvement

## 2017-06-26 NOTE — Assessment & Plan Note (Signed)
Recurrent - started 2017 Saw allergist in 2018 - autoimmune causes ruled out, no evidence tick related allergy to meat No obvious food causes Not on ACE/ARB Will stop amlodipine Monitor - if recurs can refer back to allergy -- he is not taking daily anti-histamines as recommended by allergy

## 2017-06-26 NOTE — Assessment & Plan Note (Signed)
Restart lipitor Will recheck blood work after being on medication He will start exercising and will work on weight loss

## 2017-06-26 NOTE — Assessment & Plan Note (Signed)
Not controlled, but not currently on medication - stopping amlodipine and lipitor due to concern over tongue swelling Amlodipine could be a possible cause - will stop Start bystolic if not too expensive - if it is will try a different BB

## 2017-06-30 DIAGNOSIS — H2513 Age-related nuclear cataract, bilateral: Secondary | ICD-10-CM | POA: Diagnosis not present

## 2017-06-30 DIAGNOSIS — H5213 Myopia, bilateral: Secondary | ICD-10-CM | POA: Diagnosis not present

## 2017-07-15 NOTE — Progress Notes (Signed)
Subjective:    Patient ID: Connor Martinez, male    DOB: August 03, 1950, 67 y.o.   MRN: [id number]  HPI The patient is here for follow up.  Hypertension: He is taking his medication daily. He is not compliant with a low sodium diet.  He denies chest pain, palpitations, edema, shortness of breath and regular headaches. He is active, but not exercising regularly.  He does not monitor his blood pressure at home.    Rash, left lower leg: he has been applying bactroban and clindamycin ointment on the leg. It is helping.  He did have to go several days without using because he is in the process of moving.  He does not feel that he needs anything else at this time for the rash.  Lip swelling:  He has had numerous episodes.  We officially stopped his amlodipine.  He denies any lip swelling since he was here last.     Medications and allergies reviewed with patient and updated if appropriate.  Patient Active Problem List   Diagnosis Date Noted  . Folliculitis 29/79/8921  . Angioedema 12/03/2016  . Sialadenitis 12/03/2016  . Neck pain 12/03/2016  . Hyperlipidemia 03/22/2016  . Psoriasis 02/10/2016  . Tobacco use disorder 11/26/2015  . Essential hypertension, benign 11/26/2015  . Obese 11/26/2015    Current Outpatient Medications on File Prior to Visit  Medication Sig Dispense Refill  . atorvastatin (LIPITOR) 20 MG tablet Take 1 tablet (20 mg total) by mouth daily. 90 tablet 1  . clindamycin (CLINDAGEL) 1 % gel Apply topically 2 (two) times daily. 30 g 0  . EPINEPHrine (EPIPEN 2-PAK) 0.3 mg/0.3 mL IJ SOAJ injection Inject 0.3 mLs (0.3 mg total) into the muscle once as needed (for severe allergic reaction). CAll 911 immediately if you have to use this medicine 1 Device 1  . mupirocin ointment (BACTROBAN) 2 % Place 1 application into the nose 2 (two) times daily. 22 g 0  . nebivolol (BYSTOLIC) 10 MG tablet Take 1 tablet (10 mg total) by mouth daily. 30 tablet 5  . [DISCONTINUED] lisinopril  (PRINIVIL,ZESTRIL) 10 MG tablet Take 1 tablet (10 mg total) by mouth daily. 90 tablet 3   No current facility-administered medications on file prior to visit.     Past Medical History:  Diagnosis Date  . Angioedema   . BPV (benign positional vertigo)   . Hemorrhoids   . Hyperlipidemia   . 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 BACK SURGERY    . SHOULDER SURGERY     Right    Social History   Socioeconomic History  . Marital status: Divorced    Spouse name: None  . Number of children: None  . Years of education: None  . Highest education level: None  Social Needs  . Financial resource strain: None  . Food insecurity - worry: None  . Food insecurity - inability: None  . Transportation needs - medical: None  . Transportation needs - non-medical: None  Occupational History  . None  Tobacco Use  . Smoking status: Current Every Day Smoker  . Smokeless tobacco: Never Used  Substance and Sexual Activity  . Alcohol use: Yes    Comment: infrequent  . Drug use: Yes    Types: Marijuana    Comment: occasional  . Sexual activity: None  Other Topics Concern  . None  Social History Narrative  . None    Family History  Problem Relation Age of Onset  . Stroke Mother   . Hypertension Mother   . Heart disease Mother   . Hepatitis C Brother   . Hypertension Brother     Review of Systems  Constitutional: Negative for chills and fever.  Respiratory: Negative for cough, shortness of breath and wheezing.   Cardiovascular: Negative for chest pain, palpitations and leg swelling.  Neurological: Positive for headaches (occ, slight). Negative for dizziness, light-headedness and numbness (tingling around right ear).       Objective:   Vitals:   07/16/17 1441  BP: (!) 210/88  Pulse: 65  Resp: 16  Temp: 99.5 F (37.5 C)  SpO2: 96%   Wt Readings from Last 3 Encounters:    07/16/17 219 lb (99.3 kg)  06/25/17 216 lb (98 kg)  03/09/17 222 lb (100.7 kg)   Body mass index is 35.89 kg/m.   Physical Exam    Constitutional: Appears well-developed and well-nourished. No distress.  HENT:  Head: Normocephalic and atraumatic.  Neck: Neck supple. No tracheal deviation present. No thyromegaly present.  No cervical lymphadenopathy Cardiovascular: Normal rate, regular rhythm and normal heart sounds.   2/6 sys murmur heard. No carotid bruit .  No edema Pulmonary/Chest: Effort normal and breath sounds normal. No respiratory distress. No has no wheezes. No rales.  Skin: Skin is warm and dry. Not diaphoretic.  Psychiatric: Normal mood and affect. Behavior is normal.      Assessment & Plan:    See Problem List for Assessment and Plan of chronic medical problems.

## 2017-07-16 ENCOUNTER — Ambulatory Visit (INDEPENDENT_AMBULATORY_CARE_PROVIDER_SITE_OTHER): Payer: Medicare HMO | Admitting: Internal Medicine

## 2017-07-16 ENCOUNTER — Encounter: Payer: Self-pay | Admitting: Internal Medicine

## 2017-07-16 VITALS — BP 210/88 | HR 65 | Temp 99.5°F | Resp 16 | Wt 219.0 lb

## 2017-07-16 DIAGNOSIS — T783XXD Angioneurotic edema, subsequent encounter: Secondary | ICD-10-CM | POA: Diagnosis not present

## 2017-07-16 DIAGNOSIS — I10 Essential (primary) hypertension: Secondary | ICD-10-CM

## 2017-07-16 MED ORDER — HYDRALAZINE HCL 25 MG PO TABS: ORAL_TABLET | ORAL | 3 refills | Status: DC

## 2017-07-16 NOTE — Patient Instructions (Addendum)
Medications reviewed and updated.  Changes include starting hydralazine 25 mg three time a day for one week, then increase to 50 mg three times a week  Your prescription(s) have been submitted to your pharmacy. Please take as directed and contact our office if you believe you are having problem(s) with the medication(s).   Please followup in 1 month for a physical    DASH Eating Plan DASH stands for "Dietary Approaches to Stop Hypertension." The DASH eating plan is a healthy eating plan that has been shown to reduce high blood pressure (hypertension). It may also reduce your risk for type 2 diabetes, heart disease, and stroke. The DASH eating plan may also help with weight loss. What are tips for following this plan? General guidelines  Avoid eating more than 2,300 mg (milligrams) of salt (sodium) a day. If you have hypertension, you may need to reduce your sodium intake to 1,500 mg a day.  Limit alcohol intake to no more than 1 drink a day for nonpregnant women and 2 drinks a day for men. One drink equals 12 oz of beer, 5 oz of wine, or 1 oz of hard liquor.  Work with your health care provider to maintain a healthy body weight or to lose weight. Ask what an ideal weight is for you.  Get at least 30 minutes of exercise that causes your heart to beat faster (aerobic exercise) most days of the week. Activities may include walking, swimming, or biking.  Work with your health care provider or diet and nutrition specialist (dietitian) to adjust your eating plan to your individual calorie needs. Reading food labels  Check food labels for the amount of sodium per serving. Choose foods with less than 5 percent of the Daily Value of sodium. Generally, foods with less than 300 mg of sodium per serving fit into this eating plan.  To find whole grains, look for the word "whole" as the first word in the ingredient list. Shopping  Buy products labeled as "low-sodium" or "no salt added."  Buy  fresh foods. Avoid canned foods and premade or frozen meals. Cooking  Avoid adding salt when cooking. Use salt-free seasonings or herbs instead of table salt or sea salt. Check with your health care provider or pharmacist before using salt substitutes.  Do not fry foods. Cook foods using healthy methods such as baking, boiling, grilling, and broiling instead.  Cook with heart-healthy oils, such as olive, canola, soybean, or sunflower oil. Meal planning   Eat a balanced diet that includes: ? 5 or more servings of fruits and vegetables each day. At each meal, try to fill half of your plate with fruits and vegetables. ? Up to 6-8 servings of whole grains each day. ? Less than 6 oz of lean meat, poultry, or fish each day. A 3-oz serving of meat is about the same size as a deck of cards. One egg equals 1 oz. ? 2 servings of low-fat dairy each day. ? A serving of nuts, seeds, or beans 5 times each week. ? Heart-healthy fats. Healthy fats called Omega-3 fatty acids are found in foods such as flaxseeds and coldwater fish, like sardines, salmon, and mackerel.  Limit how much you eat of the following: ? Canned or prepackaged foods. ? Food that is high in trans fat, such as fried foods. ? Food that is high in saturated fat, such as fatty meat. ? Sweets, desserts, sugary drinks, and other foods with added sugar. ? Full-fat dairy products.  Do  not salt foods before eating.  Try to eat at least 2 vegetarian meals each week.  Eat more home-cooked food and less restaurant, buffet, and fast food.  When eating at a restaurant, ask that your food be prepared with less salt or no salt, if possible. What foods are recommended? The items listed may not be a complete list. Talk with your dietitian about what dietary choices are best for you. Grains Whole-grain or whole-wheat bread. Whole-grain or whole-wheat pasta. Brown rice. [place]. Bulgur. Whole-grain and low-sodium cereals. Pita bread.  Low-fat, low-sodium crackers. Whole-wheat flour tortillas. Vegetables Fresh or frozen vegetables (raw, steamed, roasted, or grilled). Low-sodium or reduced-sodium tomato and vegetable juice. Low-sodium or reduced-sodium tomato sauce and tomato paste. Low-sodium or reduced-sodium canned vegetables. Fruits All fresh, dried, or frozen fruit. Canned fruit in natural juice (without added sugar). Meat and other protein foods Skinless chicken or [place]. Ground chicken or [place]. Pork with fat trimmed off. Fish and seafood. Egg whites. Dried beans, peas, or lentils. Unsalted nuts, nut butters, and seeds. Unsalted canned beans. Lean cuts of beef with fat trimmed off. Low-sodium, lean deli meat. Dairy Low-fat (1%) or fat-free (skim) milk. Fat-free, low-fat, or reduced-fat cheeses. Nonfat, low-sodium ricotta or cottage cheese. Low-fat or nonfat yogurt. Low-fat, low-sodium cheese. Fats and oils Soft margarine without trans fats. Vegetable oil. Low-fat, reduced-fat, or light mayonnaise and salad dressings (reduced-sodium). Canola, safflower, olive, soybean, and sunflower oils. Avocado. Seasoning and other foods Herbs. Spices. Seasoning mixes without salt. Unsalted popcorn and pretzels. Fat-free sweets. What foods are not recommended? The items listed may not be a complete list. Talk with your dietitian about what dietary choices are best for you. Grains Baked goods made with fat, such as croissants, muffins, or some breads. Dry pasta or rice meal packs. Vegetables Creamed or fried vegetables. Vegetables in a cheese sauce. Regular canned vegetables (not low-sodium or reduced-sodium). Regular canned tomato sauce and paste (not low-sodium or reduced-sodium). Regular tomato and vegetable juice (not low-sodium or reduced-sodium). Angie Fava. Olives. Fruits Canned fruit in a light or heavy syrup. Fried fruit. Fruit in cream or butter sauce. Meat and other protein foods Fatty cuts of meat. Ribs. Fried meat. Berniece Salines.  Sausage. Bologna and other processed lunch meats. Salami. Fatback. Hotdogs. Bratwurst. Salted nuts and seeds. Canned beans with added salt. Canned or smoked fish. Whole eggs or egg yolks. Chicken or [place] with skin. Dairy Whole or 2% milk, cream, and half-and-half. Whole or full-fat cream cheese. Whole-fat or sweetened yogurt. Full-fat cheese. Nondairy creamers. Whipped toppings. Processed cheese and cheese spreads. Fats and oils Butter. Stick margarine. Lard. Shortening. Ghee. Bacon fat. Tropical oils, such as coconut, palm kernel, or palm oil. Seasoning and other foods Salted popcorn and pretzels. Onion salt, garlic salt, seasoned salt, table salt, and sea salt. Worcestershire sauce. Tartar sauce. Barbecue sauce. Teriyaki sauce. Soy sauce, including reduced-sodium. Steak sauce. Canned and packaged gravies. Fish sauce. Oyster sauce. Cocktail sauce. Horseradish that you find on the shelf. Ketchup. Mustard. Meat flavorings and tenderizers. Bouillon cubes. Hot sauce and Tabasco sauce. Premade or packaged marinades. Premade or packaged taco seasonings. Relishes. Regular salad dressings. Where to find more information:  National Heart, Lung, and [place]: https://wilson-eaton.com/  American Heart Association: www.heart.org Summary  The DASH eating plan is a healthy eating plan that has been shown to reduce high blood pressure (hypertension). It may also reduce your risk for type 2 diabetes, heart disease, and stroke.  With the DASH eating plan, you should limit salt (sodium) intake  to 2,300 mg a day. If you have hypertension, you may need to reduce your sodium intake to 1,500 mg a day.  When on the DASH eating plan, aim to eat more fresh fruits and vegetables, whole grains, lean proteins, low-fat dairy, and heart-healthy fats.  Work with your health care provider or diet and nutrition specialist (dietitian) to adjust your eating plan to your individual calorie needs. This information is not intended  to replace advice given to you by your health care provider. Make sure you discuss any questions you have with your health care provider. Document Released: 05/28/2011 Document Revised: 06/01/2016 Document Reviewed: 06/01/2016 Elsevier Interactive Patient Education  [employer].

## 2017-07-16 NOTE — Assessment & Plan Note (Addendum)
Not controlled Stressed the importance of low-sodium diet, exercise and weight loss Continue bystolic 10 mg daily Need to avoid ACE inhibitors, ARB and amlodipine due to lip swelling He would prefer not to take a diuretic Add hydralazine 25 mg TID, after one week increase to 50 mg TID F/u in 1 month

## 2017-07-17 NOTE — Assessment & Plan Note (Signed)
No recurrences since he was here last He has seen allergy-no obvious cause was found.  They did recommend daily antihistamines, which he is not taking Discontinued amlodipine as that may be a potential cause If recurs will refer back to allergy

## 2017-08-04 ENCOUNTER — Other Ambulatory Visit: Payer: Self-pay

## 2017-08-04 ENCOUNTER — Encounter (HOSPITAL_COMMUNITY): Payer: Self-pay

## 2017-08-04 ENCOUNTER — Emergency Department (HOSPITAL_COMMUNITY)
Admission: EM | Admit: 2017-08-04 | Discharge: 2017-08-05 | Disposition: A | Discharge status: Home / Self Care | Payer: Medicare HMO | Attending: Emergency Medicine | Admitting: Emergency Medicine

## 2017-08-04 DIAGNOSIS — F17200 Nicotine dependence, unspecified, uncomplicated: Secondary | ICD-10-CM | POA: Diagnosis not present

## 2017-08-04 DIAGNOSIS — T783XXA Angioneurotic edema, initial encounter: Secondary | ICD-10-CM | POA: Insufficient documentation

## 2017-08-04 DIAGNOSIS — Z79899 Other long term (current) drug therapy: Secondary | ICD-10-CM | POA: Insufficient documentation

## 2017-08-04 DIAGNOSIS — R220 Localized swelling, mass and lump, head: Secondary | ICD-10-CM | POA: Diagnosis present

## 2017-08-04 MED ORDER — DIPHENHYDRAMINE HCL 50 MG/ML IJ SOLN
50.0000 mg | Freq: Once | INTRAMUSCULAR | Status: AC
  Administered 2017-08-04: 50 mg via INTRAVENOUS
  Filled 2017-08-04: qty 1

## 2017-08-04 MED ORDER — FAMOTIDINE IN NACL 20-0.9 MG/50ML-% IV SOLN
20.0000 mg | Freq: Once | INTRAVENOUS | Status: AC
  Administered 2017-08-05: 20 mg via INTRAVENOUS
  Filled 2017-08-04: qty 50

## 2017-08-04 MED ORDER — METHYLPREDNISOLONE SODIUM SUCC 125 MG IJ SOLR
125.0000 mg | Freq: Once | INTRAMUSCULAR | Status: AC
  Administered 2017-08-04: 125 mg via INTRAVENOUS
  Filled 2017-08-04: qty 2

## 2017-08-04 MED ORDER — EPINEPHRINE 0.3 MG/0.3ML IJ SOAJ
0.3000 mg | Freq: Once | INTRAMUSCULAR | Status: AC
  Administered 2017-08-05: 0.3 mg via INTRAMUSCULAR
  Filled 2017-08-04: qty 0.3

## 2017-08-04 NOTE — ED Triage Notes (Addendum)
Pt. From home experiencing angioedema for 5  Hours. Pt. Reports taking starting 50 mg hydralazine last week. Tongue appears red and with increased swelling. Pt. Reports taking oral benadryl. NAD at this time.

## 2017-08-04 NOTE — ED Provider Notes (Signed)
TIME SEEN: 11:41 PM  CHIEF COMPLAINT: Tongue swelling  HPI: Patient is a 67 year old male with history of hyperlipidemia and angioedema who presents to the emergency department with tongue swelling that started 5 hours ago.  States it is hard to talk and swallow but no difficulty breathing yet.  No lip swelling.  No rash.  Took 4 Benadryl tablets at home today over the course of the last 5 hours without any relief.  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  Was previously on lisinopril but has been taken off this medication.  Is only on hydralazine now.  No other new medications, soaps, lotions, detergents, food exposures.  He is being followed by an outpatient allergist.  ROS: See HPI Constitutional: no fever  Eyes: no drainage  ENT: no runny nose   Cardiovascular:  no chest pain  Resp: no SOB  GI: no vomiting GU: no dysuria Integumentary: no rash  Allergy: no hives  Musculoskeletal: no leg swelling  Neurological: no slurred speech ROS otherwise negative  PAST MEDICAL HISTORY/PAST SURGICAL HISTORY:  Past Medical History:  Diagnosis Date  . Angioedema   . BPV (benign positional vertigo)   . Hemorrhoids   . Hyperlipidemia   . 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 Authorizing Provider  atorvastatin (LIPITOR) 20 MG tablet Take 1 tablet (20 mg total) by mouth daily. 06/25/17   [name], MD  clindamycin (CLINDAGEL) 1 % gel Apply topically 2 (two) times daily. 06/25/17   [name], MD  EPINEPHrine (EPIPEN 2-PAK) 0.3 mg/0.3 mL IJ SOAJ injection Inject 0.3 mLs (0.3 mg total) into the muscle once as needed (for severe allergic reaction). CAll 911 immediately if you have to use this medicine 04/05/17   Larene Pickett, PA-C  hydrALAZINE (APRESOLINE) 25 MG tablet 25 mg po TID x 1 week then increase to 50 mg po TID 07/16/17   [name], MD   mupirocin ointment (BACTROBAN) 2 % Place 1 application into the nose 2 (two) times daily. 06/25/17   [name], MD  nebivolol (BYSTOLIC) 10 MG tablet Take 1 tablet (10 mg total) by mouth daily. 06/25/17   [name], MD  lisinopril (PRINIVIL,ZESTRIL) 10 MG tablet Take 1 tablet (10 mg total) by mouth daily. 11/26/15 03/23/16  [name], MD    ALLERGIES:  Allergies  Allergen Reactions  . Ace Inhibitors Swelling    angioedema  . Amlodipine     ? Lip swelling  . Penicillins Rash    SOCIAL HISTORY:  Social History   Tobacco Use  . Smoking status: Current Every Day Smoker  . Smokeless tobacco: Never Used  Substance Use Topics  . Alcohol use: Yes    Comment: infrequent    FAMILY HISTORY: Family History  Problem Relation Age of Onset  . Stroke Mother   . Hypertension Mother   . Heart disease Mother   . Hepatitis C Brother   . Hypertension Brother     EXAM: BP (!) 199/95 (BP Location: Right Arm)   Pulse 78   Temp 98.5 F (36.9 C) (Oral)   Resp 19   SpO2 98%  CONSTITUTIONAL: Alert and oriented and responds appropriately to questions. Well-appearing; well-nourished HEAD: Normocephalic EYES: Conjunctivae clear, pupils appear equal, EOMI ENT: normal nose; moist mucous membranes; No pharyngeal erythema or petechiae,  no tonsillar hypertrophy or exudate, no uvular deviation, no unilateral swelling, no trismus or drooling, no stridor, no dental caries present, no drainable dental abscess noted, no Ludwig's angina, tongue sits flat in the bottom of the mouth, patient has significant tongue swelling with slightly muffled voice secondary to swollen tongue but posterior oropharynx appears patent and is not swollen, no facial erythema or warmth, no facial swelling; no pain with movement of the neck. NECK: Supple, no meningismus, no nuchal rigidity, no LAD  CARD: RRR; S1 and S2 appreciated; no murmurs, no clicks, no rubs, no gallops RESP: Normal chest excursion without splinting or  tachypnea; breath sounds clear and equal bilaterally; no wheezes, no rhonchi, no rales, no hypoxia or respiratory distress, speaking full sentences ABD/GI: Normal bowel sounds; non-distended; soft, non-tender, no rebound, no guarding, no peritoneal signs, no hepatosplenomegaly BACK:  The back appears normal and is non-tender to palpation, there is no CVA tenderness EXT: Normal ROM in all joints; non-tender to palpation; no edema; normal capillary refill; no cyanosis, no calf tenderness or swelling    SKIN: Normal color for age and race; warm; no rash NEURO: Moves all extremities equally PSYCH: The patient's mood and manner are appropriate. Grooming and personal hygiene are appropriate.  MEDICAL DECISION MAKING: Patient here with angioedema.  Has history of the same.  No longer on ACE inhibitors.  No other sign of allergic reaction but will treat with Benadryl, Solu-Medrol, Pepcid, epinephrine.  Will check basic labs.  We will continue to closely monitor.  He is very resistant to admission at this time.  Airway is patent.  ED PROGRESS: Patient's symptoms are slowly improving.  We will continue to closely monitor.  Labs unremarkable.   3:50 AM  Pt has been monitored for 4 hours after epinephrine.  Tongue swelling has significantly improved and now his phonation is normal.  I feel he is safe for discharge.  Will discharge with EpiPen prescription as well as prednisone burst.  Discussed return precautions.  He has a PCP for close follow-up.    CRITICAL CARE Performed by: Pryor Curia   Total critical care time: 45 minutes  Critical care time was exclusive of separately billable procedures and treating other patients.  Critical care was necessary to treat or prevent imminent or life-threatening deterioration.  Critical care was time spent personally by me on the following activities: development of treatment plan with patient and/or surrogate as well as nursing, discussions with consultants,  evaluation of patient's response to treatment, examination of patient, obtaining history from patient or surrogate, ordering and performing treatments and interventions, ordering and review of laboratory studies, ordering and review of radiographic studies, pulse oximetry and re-evaluation of patient's condition.    Jakhia Buxton, Delice Bison, DO 08/05/17 [id number]

## 2017-08-05 ENCOUNTER — Telehealth: Payer: Self-pay | Admitting: Internal Medicine

## 2017-08-05 LAB — BASIC METABOLIC PANEL
Anion gap: 11 (ref 5–15)
BUN: 14 mg/dL (ref 6–20)
CALCIUM: 8.9 mg/dL (ref 8.9–10.3)
CO2: 22 mmol/L (ref 22–32)
Chloride: 104 mmol/L (ref 101–111)
Creatinine, Ser: 0.79 mg/dL (ref 0.61–1.24)
Glucose, Bld: 103 mg/dL — ABNORMAL HIGH (ref 65–99)
Potassium: 4.3 mmol/L (ref 3.5–5.1)
SODIUM: 137 mmol/L (ref 135–145)

## 2017-08-05 LAB — CBC WITH DIFFERENTIAL/PLATELET
BASOS ABS: 0 10*3/uL (ref 0.0–0.1)
BASOS PCT: 0 %
EOS PCT: 4 %
Eosinophils Absolute: 0.4 10*3/uL (ref 0.0–0.7)
HCT: 43.2 % (ref 39.0–52.0)
Hemoglobin: 14.3 g/dL (ref 13.0–17.0)
LYMPHS PCT: 22 %
Lymphs Abs: 2.1 10*3/uL (ref 0.7–4.0)
MCH: 28.1 pg (ref 26.0–34.0)
MCHC: 33.1 g/dL (ref 30.0–36.0)
MCV: 85 fL (ref 78.0–100.0)
MONO ABS: 1 10*3/uL (ref 0.1–1.0)
Monocytes Relative: 10 %
NEUTROS ABS: 6.1 10*3/uL (ref 1.7–7.7)
NEUTROS PCT: 64 %
PLATELETS: 278 10*3/uL (ref 150–400)
RBC: 5.08 MIL/uL (ref 4.22–5.81)
RDW: 14.5 % (ref 11.5–15.5)
WBC: 9.6 10*3/uL (ref 4.0–10.5)

## 2017-08-05 MED ORDER — EPINEPHRINE 0.3 MG/0.3ML IJ SOAJ: 0.3000 mg | Freq: Once | INTRAMUSCULAR | 1 refills | Status: AC

## 2017-08-05 MED ORDER — PREDNISONE 20 MG PO TABS: 60.0000 mg | ORAL_TABLET | Freq: Every day | ORAL | 0 refills | Status: DC

## 2017-08-05 NOTE — Telephone Encounter (Signed)
Copied from [place] [id number]. >> Aug 05, 2017  5:14 PM Neva Seat wrote: Hydralazine 25 mg  2 tbs 2 times a day.  Pt went to hospital with swollen tongue.  Pt hasn't taken the Rx since last night.  Please call pt to discuss care.

## 2017-08-05 NOTE — Telephone Encounter (Signed)
Patient called in wanting to know if he needs to continue taking the Hydralazine for his BP. He says "I went to the emergency room yesterday for my tongue and lips swelling. I took benadryl, they gave me some medicine and now I'm fine. I didn't know if this BP medicine is causing it. I wanted to ask the doctor or you." I advised that I would send this note over to Dr. [name] and someone will contact him. He asked to be called on his cell [phone].

## 2017-08-05 NOTE — Discharge Instructions (Signed)
You may continue Benadryl 50 mg every 8 hours as needed.

## 2017-08-05 NOTE — Telephone Encounter (Signed)
Copied from [place] [phone]. Topic: General - Other >> Aug 05, 2017  5:14 PM Neva Seat wrote: Hydralazine 25 mg  2 tbs 2 times a day.  Pt went to hospital with swollen tongue.  Pt hasn't taken the Rx since last night.  Please call pt to discuss care.

## 2017-08-06 NOTE — Telephone Encounter (Signed)
LVM informing of MDs response.

## 2017-08-06 NOTE — Telephone Encounter (Signed)
It is not the blood pressure medication - he needs to take it. He needs to be taking zyrtec daily too to help prevent the lip swelling.

## 2017-08-15 DIAGNOSIS — R739 Hyperglycemia, unspecified: Secondary | ICD-10-CM | POA: Insufficient documentation

## 2017-08-15 NOTE — Patient Instructions (Addendum)
Connor Martinez , Thank you for taking time to come for your Medicare Wellness Visit. I appreciate your ongoing commitment to your health goals. Please review the following plan we discussed and let me know if I can assist you in the future.   These are the goals we discussed: Goals    Work on weight loss, improve your eating, increase your exercise      This is a list of the screening recommended for you and due dates:  Health Maintenance  Topic Date Due  . Colon Cancer Screening  03/14/2002  . Flu Shot  03/18/2018*  . Pneumonia vaccines (1 of 2 - PCV13) 08/16/2018*  . Tetanus Vaccine  04/09/2021  .  Hepatitis C: One time screening is recommended by Center for Disease Control  (CDC) for  adults born from [birth year] through 1964.   Completed  . HIV Screening  Completed  *Topic was postponed. The date shown is not the original due date.     Test(s) ordered today. Your results will be released to [facility] (or called to you) after review, usually within 72hours after test completion. If any changes need to be made, you will be notified at that same time.  All other Health Maintenance issues reviewed.   All recommended immunizations and age-appropriate screenings are up-to-date or discussed.  No immunizations administered today. An EKG was done today.   Medications reviewed and updated.  Changes include using a steroid cream in addition to the clindamycin cream.  Increase the hydralazine to 75 mg ( 3 pills ) three times a day.   Your prescription(s) have been submitted to your pharmacy. Please take as directed and contact our office if you believe you are having problem(s) with the medication(s).  A referral was ordered for ENT.   Please followup in 3 months     Health Maintenance, Male A healthy lifestyle and preventive care is important for your health and wellness. Ask your health care provider about what schedule of regular examinations is right for you. What should I know about weight  and diet? Eat a Healthy Diet  Eat plenty of vegetables, fruits, whole grains, low-fat dairy products, and lean protein.  Do not eat a lot of foods high in solid fats, added sugars, or salt.  Maintain a Healthy Weight Regular exercise can help you achieve or maintain a healthy weight. You should:  Do at least 150 minutes of exercise each week. The exercise should increase your heart rate and make you sweat (moderate-intensity exercise).  Do strength-training exercises at least twice a week.  Watch Your Levels of Cholesterol and Blood Lipids  Have your blood tested for lipids and cholesterol every 5 years starting at 66 years of age. If you are at high risk for heart disease, you should start having your blood tested when you are 66 years old. You may need to have your cholesterol levels checked more often if: ? Your lipid or cholesterol levels are high. ? You are older than 66 years of age. ? You are at high risk for heart disease.  What should I know about cancer screening? Many types of cancers can be detected early and may often be prevented. Lung Cancer  You should be screened every year for lung cancer if: ? You are a current smoker who has smoked for at least 30 years. ? You are a former smoker who has quit within the past 15 years.  Talk to your health care provider about your screening options,  when you should start screening, and how often you should be screened.  Colorectal Cancer  Routine colorectal cancer screening usually begins at 66 years of age and should be repeated every 5-10 years until you are 66 years old. You may need to be screened more often if early forms of precancerous polyps or small growths are found. Your health care provider may recommend screening at an earlier age if you have risk factors for colon cancer.  Your health care provider may recommend using home test kits to check for hidden blood in the stool.  A small camera at the end of a tube can  be used to examine your colon (sigmoidoscopy or colonoscopy). This checks for the earliest forms of colorectal cancer.  Prostate and Testicular Cancer  Depending on your age and overall health, your health care provider may do certain tests to screen for prostate and testicular cancer.  Talk to your health care provider about any symptoms or concerns you have about testicular or prostate cancer.  Skin Cancer  Check your skin from head to toe regularly.  Tell your health care provider about any new moles or changes in moles, especially if: ? There is a change in a mole's size, shape, or color. ? You have a mole that is larger than a pencil eraser.  Always use sunscreen. Apply sunscreen liberally and repeat throughout the day.  Protect yourself by wearing long sleeves, pants, a wide-brimmed hat, and sunglasses when outside.  What should I know about heart disease, diabetes, and high blood pressure?  If you are 36-18 years of age, have your blood pressure checked every 3-5 years. If you are 33 years of age or older, have your blood pressure checked every year. You should have your blood pressure measured twice-once when you are at a hospital or clinic, and once when you are not at a hospital or clinic. Record the average of the two measurements. To check your blood pressure when you are not at a hospital or clinic, you can use: ? An automated blood pressure machine at a pharmacy. ? A home blood pressure monitor.  Talk to your health care provider about your target blood pressure.  If you are between 61-61 years old, ask your health care provider if you should take aspirin to prevent heart disease.  Have regular diabetes screenings by checking your fasting blood sugar level. ? If you are at a normal weight and have a low risk for diabetes, have this test once every three years after the age of 70. ? If you are overweight and have a high risk for diabetes, consider being tested at a  younger age or more often.  A one-time screening for abdominal aortic aneurysm (AAA) by ultrasound is recommended for men aged 68-75 years who are current or former smokers. What should I know about preventing infection? Hepatitis B If you have a higher risk for hepatitis B, you should be screened for this virus. Talk with your health care provider to find out if you are at risk for hepatitis B infection. Hepatitis C Blood testing is recommended for:  Everyone born from [birth year] through 1964.  Anyone with known risk factors for hepatitis C.  Sexually Transmitted Diseases (STDs)  You should be screened each year for STDs including gonorrhea and chlamydia if: ? You are sexually active and are younger than 66 years of age. ? You are older than 66 years of age and your health care provider tells you that you  are at risk for this type of infection. ? Your sexual activity has changed since you were last screened and you are at an increased risk for chlamydia or gonorrhea. Ask your health care provider if you are at risk.  Talk with your health care provider about whether you are at high risk of being infected with HIV. Your health care provider may recommend a prescription medicine to help prevent HIV infection.  What else can I do?  Schedule regular health, dental, and eye exams.  Stay current with your vaccines (immunizations).  Do not use any tobacco products, such as cigarettes, chewing tobacco, and e-cigarettes. If you need help quitting, ask your health care provider.  Limit alcohol intake to no more than 2 drinks per day. One drink equals 12 ounces of beer, 5 ounces of wine, or 1 ounces of hard liquor.  Do not use street drugs.  Do not share needles.  Ask your health care provider for help if you need support or information about quitting drugs.  Tell your health care provider if you often feel depressed.  Tell your health care provider if you have ever been abused or do not  feel safe at home. This information is not intended to replace advice given to you by your health care provider. Make sure you discuss any questions you have with your health care provider. Document Released: 12/05/2007 Document Revised: 02/05/2016 Document Reviewed: 03/12/2015 Elsevier Interactive Patient Education  [employer].

## 2017-08-15 NOTE — Progress Notes (Signed)
Subjective:    Patient ID: Connor Martinez, male    DOB: February 25, 1951, 67 y.o.   MRN: [id number]  HPI Here for a welcome to medicare wellness exam and an annual physical exam  I have personally reviewed and have noted 1.The patient's medical and social history 2.Their use of alcohol, tobacco or illicit drugs 3.Their current medications and supplements 4.The patient's functional ability including ADL's, fall risks, home                 safety risk and hearing or visual impairment. 5.Diet and physical activities 6.Evidence for depression or mood disorders 7.Care team reviewed  -   visionworks for eye exams    Are there smokers in your home (other than you)? No  Risk Factors Exercise:  Doing some walking - will increase Dietary issues discussed:  Has decreased portions, trying to eat more healthy - still eating back foods.   Vitamin and supplement use:  none  Opiod use: none Side effects from medication:  n/a Does medications benefits outweigh risks/side effects:   n/a  Cardiac risk factors: advanced age, hypertension, hyperlipidemia, and obesity.  Depression Screen  Have you felt down, depressed or hopeless? No  Have you felt little interest or pleasure in doing things?  No  Activities of Daily Living In your present state of health, do you have any difficulty performing the following activities?:  Driving? No Managing money?  No Feeding yourself? No Getting from bed to chair? No Climbing a flight of stairs? No Preparing food and eating?: No Bathing or showering? No Getting dressed: No Getting to/using the toilet? No Moving around from place to place: No In the past year have you fallen or had a near fall?: No   Are you sexually active?  No  Do you have more than one partner?  N/A  Hearing Difficulties: No Do you often ask people to speak up or repeat themselves? No Do you experience ringing or noises  in your ears? Intermittent left ear Do you have difficulty understanding soft or whispered voices? No Vision:              Any change in vision:  Decreased vision - now wearing glasses             Up to date with eye exam:   Yes   Memory:  Do you feel that you have a problem with memory? No  Do you often misplace items? No  Do you feel safe at home?  Yes  Cognitive Testing  Alert, Orientated? Yes  Normal Appearance? Yes  Recall of three objects?  Yes  Can perform simple calculations? Yes  Displays appropriate judgment? Yes  Can read the correct time from a watch face? Yes   Advanced Directives have been discussed with the patient? Yes-does not have in place, but will consider     Medications and allergies reviewed with patient and updated if appropriate.  Patient Active Problem List   Diagnosis Date Noted  . Hyperglycemia 08/15/2017  . Folliculitis 36/62/9476  . Angioedema 12/03/2016  . Sialadenitis 12/03/2016  . Neck pain 12/03/2016  . Hyperlipidemia 03/22/2016  . Psoriasis 02/10/2016  . Tobacco use disorder 11/26/2015  . Essential hypertension, benign 11/26/2015  . Obese 11/26/2015    Current Outpatient Medications on File Prior to Visit  Medication Sig Dispense Refill  . atorvastatin (LIPITOR) 20 MG tablet Take 1 tablet (20 mg total) by mouth daily. 90 tablet 1  . clindamycin (CLINDAGEL)  1 % gel Apply topically 2 (two) times daily. 30 g 0  . EPINEPHrine (EPIPEN 2-PAK) 0.3 mg/0.3 mL IJ SOAJ injection Inject 0.3 mLs (0.3 mg total) into the muscle once as needed (for severe allergic reaction). CAll 911 immediately if you have to use this medicine 1 Device 1  . hydrALAZINE (APRESOLINE) 25 MG tablet 25 mg po TID x 1 week then increase to 50 mg po TID (Patient taking differently: Take 50 mg by mouth 3 (three) times daily. ) 147 tablet 3  . mupirocin ointment (BACTROBAN) 2 % Place 1 application into the nose 2 (two) times daily. 22 g 0  . [DISCONTINUED] lisinopril  (PRINIVIL,ZESTRIL) 10 MG tablet Take 1 tablet (10 mg total) by mouth daily. 90 tablet 3   No current facility-administered medications on file prior to visit.     Past Medical History:  Diagnosis Date  . Angioedema   . BPV (benign positional vertigo)   . Hemorrhoids   . Hyperlipidemia   . 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 BACK SURGERY    . SHOULDER SURGERY     Right    Social History   Socioeconomic History  . Marital status: Divorced    Spouse name: Not on file  . Number of children: Not on file  . Years of education: Not on file  . Highest education level: Not on file  Social Needs  . Financial resource strain: Not on file  . Food insecurity - worry: Not on file  . Food insecurity - inability: Not on file  . Transportation needs - medical: Not on file  . Transportation needs - non-medical: Not on file  Occupational History  . Not on file  Tobacco Use  . Smoking status: Current Every Day Smoker  . Smokeless tobacco: Never Used  Substance and Sexual Activity  . Alcohol use: Yes    Comment: infrequent  . Drug use: Yes    Types: Marijuana    Comment: occasional  . Sexual activity: Not on file  Other Topics Concern  . Not on file  Social History Narrative  . Not on file    Family History  Problem Relation Age of Onset  . Stroke Mother   . Hypertension Mother   . Heart disease Mother   . Hepatitis C Brother   . Hypertension Brother     Review of Systems  Constitutional: Negative for chills and fever.  HENT: Positive for tinnitus (left). Negative for hearing loss.   Eyes: Negative for visual disturbance.  Respiratory: Positive for wheezing. Negative for cough and shortness of breath.   Cardiovascular: Positive for leg swelling (mild). Negative for chest pain and palpitations.  Gastrointestinal: Negative for abdominal pain, blood in stool,  constipation, diarrhea and nausea.       No gerd  Genitourinary: Negative for difficulty urinating, dysuria and hematuria.  Musculoskeletal: Negative for arthralgias and back pain.  Skin: Positive for rash (rash RLE).       Dry skin  Neurological: Negative for light-headedness and headaches.  Psychiatric/Behavioral: Negative for dysphoric mood. The patient is not nervous/anxious.        Objective:   Vitals:   08/16/17 1318  BP: (!) 156/94  Pulse: 80  Resp: 16  Temp: 99 F (37.2 C)  SpO2: 97%   Filed Weights   08/16/17 1318  Weight: 216 lb (98 kg)  Body mass index is 35.4 kg/m.  Wt Readings from Last 3 Encounters:  08/16/17 216 lb (98 kg)  07/16/17 219 lb (99.3 kg)  06/25/17 216 lb (98 kg)     Visual Acuity Screening   Right eye Left eye Both eyes  Without correction:     With correction: 20/20 20/15 20/15       Physical Exam Constitutional: He appears well-developed and well-nourished. No distress.  HENT:  Head: Normocephalic and atraumatic.  Right Ear: External ear normal.  Left Ear: External ear normal.  Mouth/Throat: Oropharynx is clear and moist.  Normal ear canals and TM b/l  Eyes: Conjunctivae and EOM are normal.  Neck: Neck supple. No tracheal deviation present. No thyromegaly present.  No carotid bruit  Cardiovascular: Normal rate, regular rhythm, normal heart sounds and intact distal pulses.  No murmur heard. Pulmonary/Chest: Effort normal and breath sounds normal. No respiratory distress. He has no wheezes. He has no rales.  Abdominal: Soft.  Obese.  He exhibits no distension. There is no tenderness. Reducible, nontender umbilical hernia Genitourinary: deferred  Musculoskeletal: He exhibits no edema.  Lymphadenopathy:   He has no cervical adenopathy.  Skin: Skin is warm and dry. He is not diaphoretic. Maculopapular rash left lower extremity-improved compared to last visit. Psychiatric: He has a normal mood and affect. His behavior is normal.          Assessment & Plan:   Wellness Exam: Immunizations   prevnar deferred, discussed shingles, others up to date Colonoscopy   ?  Done last.  Will obtain old records from Dr. Paulita Fujita Eye exam  Up to date  Hearing loss  None -no evaluation needed Memory concerns/difficulties    none Independent of ADLs      -fully independent Stressed the importance of regular exercise   Patient received copy of preventative screening tests/immunizations recommended for the next 5-10 years.    Physical exam: Screening blood work ordered Immunizations   prevnar due, discussed shingles, others up to date Colonoscopy    ?  Done last.  Will obtain old records from Dr. Paulita Fujita Eye exams  Up to date  EKG  Done today - Sinus rhythm at 78, left axis fascicular block, LVH, possible anterior septal infarct-compared to prior from 2010 more prominent Q waves V2-?  New or not-we will order an echocardiogram.  His last echocardiogram was in 2010 and was normal Exercise  Started to exercise regularly - needs to increase - encouraged increased exercise Weight  - advised weight loss Skin  Rash on legs - improving with creams Substance abuse-smoking and working on quitting, no other substance abuse  See Problem List for Assessment and Plan of chronic medical problems.   Follow-up in 3 months

## 2017-08-16 ENCOUNTER — Ambulatory Visit (INDEPENDENT_AMBULATORY_CARE_PROVIDER_SITE_OTHER): Payer: Medicare HMO | Admitting: Internal Medicine

## 2017-08-16 VITALS — BP 156/94 | HR 80 | Temp 99.0°F | Resp 16 | Ht 65.5 in | Wt 216.0 lb

## 2017-08-16 DIAGNOSIS — Z0001 Encounter for general adult medical examination with abnormal findings: Secondary | ICD-10-CM

## 2017-08-16 DIAGNOSIS — R739 Hyperglycemia, unspecified: Secondary | ICD-10-CM | POA: Diagnosis not present

## 2017-08-16 DIAGNOSIS — T783XXD Angioneurotic edema, subsequent encounter: Secondary | ICD-10-CM

## 2017-08-16 DIAGNOSIS — E7849 Other hyperlipidemia: Secondary | ICD-10-CM

## 2017-08-16 DIAGNOSIS — I10 Essential (primary) hypertension: Secondary | ICD-10-CM

## 2017-08-16 DIAGNOSIS — Z125 Encounter for screening for malignant neoplasm of prostate: Secondary | ICD-10-CM | POA: Diagnosis not present

## 2017-08-16 DIAGNOSIS — H938X1 Other specified disorders of right ear: Secondary | ICD-10-CM | POA: Diagnosis not present

## 2017-08-16 DIAGNOSIS — R9431 Abnormal electrocardiogram [ECG] [EKG]: Secondary | ICD-10-CM

## 2017-08-16 DIAGNOSIS — Z0000 Encounter for general adult medical examination without abnormal findings: Secondary | ICD-10-CM

## 2017-08-16 DIAGNOSIS — L739 Follicular disorder, unspecified: Secondary | ICD-10-CM

## 2017-08-16 MED ORDER — TRIAMCINOLONE ACETONIDE 0.5 % EX OINT: 1.0000 "application " | TOPICAL_OINTMENT | Freq: Two times a day (BID) | CUTANEOUS | 2 refills | Status: DC

## 2017-08-16 MED ORDER — CLINDAMYCIN PHOSPHATE 1 % EX GEL: Freq: Two times a day (BID) | CUTANEOUS | 2 refills | Status: DC

## 2017-08-16 MED ORDER — HYDRALAZINE HCL 25 MG PO TABS: 75.0000 mg | ORAL_TABLET | Freq: Three times a day (TID) | ORAL | 5 refills | Status: DC

## 2017-08-17 ENCOUNTER — Encounter: Payer: Self-pay | Admitting: Internal Medicine

## 2017-08-17 DIAGNOSIS — H938X1 Other specified disorders of right ear: Secondary | ICD-10-CM | POA: Insufficient documentation

## 2017-08-17 DIAGNOSIS — R9431 Abnormal electrocardiogram [ECG] [EKG]: Secondary | ICD-10-CM | POA: Insufficient documentation

## 2017-08-17 NOTE — Assessment & Plan Note (Signed)
Has had multiple times-cause unknown Has seen allergy and they advised taking an antihistamine on a daily basis since the cause was unknown.  He does not want to do this-does not want to take unnecessary medication 2 blood pressure medications stopped because of they could be the possible cause, but he has had episodes off of these medications Will monitor and treat as needed

## 2017-08-17 NOTE — Assessment & Plan Note (Signed)
Check lipid panel  Continue daily statin Regular exercise and healthy diet encouraged  

## 2017-08-17 NOTE — Assessment & Plan Note (Signed)
Has been experiencing intermittent right ear pressure and the pressure sometimes feels like it encompasses them in the outside of the ear.  He will have a sensation of noise intermittently He is concerned about the possibility of a brain tumor and does want imaging Symptoms possibly related to tinnitus and eustachian tube dysfunction Will refer to ENT for further evaluation

## 2017-08-17 NOTE — Assessment & Plan Note (Signed)
Uncontrolled We will increase hydralazine to 75 mg 3 times a day-I think you would be more compliant with sticking to one medication and being on multiple medications.  He has also had questionable side effects to other medications Stressed lifestyle changes-he is making some headway with this-increase exercise, work on weight loss and improve diet Follow-up in 3 months

## 2017-08-17 NOTE — Assessment & Plan Note (Signed)
Check A1c. 

## 2017-08-17 NOTE — Assessment & Plan Note (Signed)
EKG shows possible anterior septal infarct-Q waves in V1, V2.  Prior EKG did show probable Q waves in V1, but not be to Last echocardiogram 2010 was normal Will recheck echo Stressed the importance of getting his blood pressure better controlled

## 2017-08-17 NOTE — Assessment & Plan Note (Signed)
Left lower extremity Improving Continue clindamycin gel Will add triamcinolone ointment, discontinue Bactroban

## 2017-08-19 ENCOUNTER — Telehealth: Payer: Self-pay | Admitting: *Deleted

## 2017-08-19 NOTE — Telephone Encounter (Signed)
Patient requested medical records from [facility], upon arrival records will be sent to [facility]

## 2017-08-26 ENCOUNTER — Telehealth: Payer: Self-pay | Admitting: Internal Medicine

## 2017-08-26 DIAGNOSIS — H903 Sensorineural hearing loss, bilateral: Secondary | ICD-10-CM | POA: Diagnosis not present

## 2017-08-26 NOTE — Telephone Encounter (Unsigned)
Copied from [place] [id number]. Topic: Quick Communication - See Telephone Encounter >> Aug 26, 2017  1:02 PM Hewitt Shorts wrote: CRM for notification. See Telephone encounter for: pt has an appt with chris newman and is needing a copy of his insurance information faxed to [phone] he has an appt with him at [address]   [address] [phone]  08/26/17.

## 2017-09-15 ENCOUNTER — Ambulatory Visit (HOSPITAL_COMMUNITY): Payer: Medicare HMO | Attending: Internal Medicine

## 2017-09-15 ENCOUNTER — Other Ambulatory Visit: Payer: Self-pay

## 2017-09-15 DIAGNOSIS — R9431 Abnormal electrocardiogram [ECG] [EKG]: Secondary | ICD-10-CM

## 2017-09-15 DIAGNOSIS — I10 Essential (primary) hypertension: Secondary | ICD-10-CM | POA: Diagnosis not present

## 2017-09-15 DIAGNOSIS — Z6835 Body mass index (BMI) 35.0-35.9, adult: Secondary | ICD-10-CM | POA: Insufficient documentation

## 2017-09-15 DIAGNOSIS — E785 Hyperlipidemia, unspecified: Secondary | ICD-10-CM | POA: Diagnosis not present

## 2017-09-15 DIAGNOSIS — E669 Obesity, unspecified: Secondary | ICD-10-CM | POA: Insufficient documentation

## 2017-09-15 DIAGNOSIS — Z720 Tobacco use: Secondary | ICD-10-CM | POA: Diagnosis not present

## 2017-09-19 ENCOUNTER — Encounter: Payer: Self-pay | Admitting: Internal Medicine

## 2017-09-19 DIAGNOSIS — I7121 Aneurysm of the ascending aorta, without rupture: Secondary | ICD-10-CM | POA: Insufficient documentation

## 2017-09-19 DIAGNOSIS — I712 Thoracic aortic aneurysm, without rupture: Secondary | ICD-10-CM | POA: Insufficient documentation

## 2017-09-21 ENCOUNTER — Other Ambulatory Visit: Payer: Self-pay | Admitting: Internal Medicine

## 2017-09-21 DIAGNOSIS — I7121 Aneurysm of the ascending aorta, without rupture: Secondary | ICD-10-CM

## 2017-09-21 DIAGNOSIS — I712 Thoracic aortic aneurysm, without rupture: Secondary | ICD-10-CM

## 2017-09-21 NOTE — Progress Notes (Signed)
[place]

## 2017-10-04 ENCOUNTER — Other Ambulatory Visit: Payer: Self-pay | Admitting: Internal Medicine

## 2017-11-10 ENCOUNTER — Encounter: Payer: Medicare HMO | Admitting: Surgery

## 2017-11-16 ENCOUNTER — Ambulatory Visit: Payer: Medicare HMO | Admitting: Internal Medicine

## 2017-11-24 ENCOUNTER — Encounter: Payer: Medicare HMO | Admitting: Surgery

## 2017-11-24 NOTE — Progress Notes (Signed)
Subjective:    Patient ID: Connor Martinez, male    DOB: [birth year], 67 y.o.   MRN: [id number]  HPI The patient is here for follow up.  Hypertension: He is taking his hydralazine 2 times, sometimes 3 times a day. He is somewhat compliant with a low sodium diet.  He denies chest pain, palpitations, shortness of breath and regular headaches. He is not exercising regularly.  He does monitor his blood pressure at home - same as here.    Hyperlipidemia: He is taking his medication daily. He is compliant with a low fat/cholesterol diet. He is not exercising regularly. He denies myalgias.   Hyperglycemia:  He is not always compliant with a low sugar/carbohydrate diet.  He is eating less.  He is not exercising regularly.  He has lost weight.  Angioedema: He swelled up again yesterday at his house and the week before last at his apartment.  He did take beandryl.  He takes Benadryl whenever needed and that typically helps.  He has not needed any other medication and has not needed to go to the emergency room recently.  When he is feeling good he does not take an antihistamine on a daily basis.  He still has some swelling today, but it is much better.  Yesterday half of his tongue was swollen and it is no longer swollen.  Currently he still has some facial swelling but it is improving.  Medications and allergies reviewed with patient and updated if appropriate.  Patient Active Problem List   Diagnosis Date Noted  . Ascending aortic aneurysm ([place]) 09/19/2017  . Ear pressure, right 08/17/2017  . Abnormal EKG 08/17/2017  . Hyperglycemia 08/15/2017  . Folliculitis 66/44/0347  . Angioedema 12/03/2016  . Sialadenitis 12/03/2016  . Neck pain 12/03/2016  . Hyperlipidemia 03/22/2016  . Psoriasis 02/10/2016  . Tobacco use disorder 11/26/2015  . Uncontrolled hypertension 11/26/2015  . Obese 11/26/2015    Current Outpatient Medications on File Prior to Visit  Medication Sig Dispense Refill  .  atorvastatin (LIPITOR) 20 MG tablet Take 1 tablet (20 mg total) by mouth daily. 90 tablet 1  . clindamycin (CLINDAGEL) 1 % gel Apply topically 2 (two) times daily. 60 g 2  . EPINEPHrine (EPIPEN 2-PAK) 0.3 mg/0.3 mL IJ SOAJ injection Inject 0.3 mLs (0.3 mg total) into the muscle once as needed (for severe allergic reaction). CAll 911 immediately if you have to use this medicine 1 Device 1  . hydrALAZINE (APRESOLINE) 25 MG tablet Take 3 tablets (75 mg total) by mouth 3 (three) times daily. 270 tablet 5  . triamcinolone ointment (KENALOG) 0.5 % Apply 1 application topically 2 (two) times daily. 30 g 2  . [DISCONTINUED] lisinopril (PRINIVIL,ZESTRIL) 10 MG tablet Take 1 tablet (10 mg total) by mouth daily. 90 tablet 3   No current facility-administered medications on file prior to visit.     Past Medical History:  Diagnosis Date  . Angioedema   . BPV (benign positional vertigo)   . Hemorrhoids   . Hyperlipidemia   . 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 BACK SURGERY    . SHOULDER SURGERY     Right    Social History   Socioeconomic History  . Marital status: Divorced    Spouse name: Not on file  . Number of children: Not on file  . Years of education:  Not on file  . Highest education level: Not on file  Occupational History  . Not on file  Social Needs  . Financial resource strain: Not on file  . Food insecurity:    Worry: Not on file    Inability: Not on file  . Transportation needs:    Medical: Not on file    Non-medical: Not on file  Tobacco Use  . Smoking status: Current Every Day Smoker  . Smokeless tobacco: Never Used  Substance and Sexual Activity  . Alcohol use: Yes    Comment: infrequent  . Drug use: Yes    Types: Marijuana    Comment: occasional  . Sexual activity: Not on file  Lifestyle  . Physical activity:    Days per week: Not on file    Minutes per  session: Not on file  . Stress: Not on file  Relationships  . Social connections:    Talks on phone: Not on file    Gets together: Not on file    Attends religious service: Not on file    Active member of club or organization: Not on file    Attends meetings of clubs or organizations: Not on file    Relationship status: Not on file  Other Topics Concern  . Not on file  Social History Narrative  . Not on file    Family History  Problem Relation Age of Onset  . Stroke Mother   . Hypertension Mother   . Heart disease Mother   . Hepatitis C Brother   . Hypertension Brother     Review of Systems  Constitutional: Negative for fever.  HENT: Positive for facial swelling.   Respiratory: Positive for cough (from smoking). Negative for shortness of breath and wheezing.   Cardiovascular: Positive for leg swelling (intermittent). Negative for chest pain and palpitations.  Neurological: Positive for light-headedness. Negative for headaches.       Objective:   Vitals:   11/25/17 1052  BP: (!) 174/76  Pulse: 73  Resp: 16  Temp: 98.6 F (37 C)  SpO2: 96%   BP Readings from Last 3 Encounters:  11/25/17 (!) 174/76  08/16/17 (!) 156/94  08/04/17 (!) 199/95   Wt Readings from Last 3 Encounters:  11/25/17 207 lb (93.9 kg)  08/16/17 216 lb (98 kg)  07/16/17 219 lb (99.3 kg)   Body mass index is 33.92 kg/m.   Physical Exam    Constitutional: Appears well-developed and well-nourished. No distress.  HENT:  Head: Normocephalic and atraumatic.  Neck: Mild lower facial swelling.  No tongue swelling.  Neck supple. No tracheal deviation present. No thyromegaly present.  No cervical lymphadenopathy Cardiovascular: Normal rate, regular rhythm and normal heart sounds.   2/6 systolic murmur heard. No carotid bruit .  No edema Pulmonary/Chest: Effort normal and breath sounds normal. No respiratory distress. No has no wheezes. No rales.  Skin: Skin is warm and dry. Not diaphoretic.    Psychiatric: Normal mood and affect. Behavior is normal.      Assessment & Plan:    See Problem List for Assessment and Plan of chronic medical problems.

## 2017-11-25 ENCOUNTER — Encounter: Payer: Self-pay | Admitting: Internal Medicine

## 2017-11-25 ENCOUNTER — Ambulatory Visit (INDEPENDENT_AMBULATORY_CARE_PROVIDER_SITE_OTHER): Payer: Medicare HMO | Admitting: Internal Medicine

## 2017-11-25 ENCOUNTER — Other Ambulatory Visit (INDEPENDENT_AMBULATORY_CARE_PROVIDER_SITE_OTHER): Payer: Medicare HMO

## 2017-11-25 VITALS — BP 174/76 | HR 73 | Temp 98.6°F | Resp 16 | Wt 207.0 lb

## 2017-11-25 DIAGNOSIS — R739 Hyperglycemia, unspecified: Secondary | ICD-10-CM

## 2017-11-25 DIAGNOSIS — I10 Essential (primary) hypertension: Secondary | ICD-10-CM | POA: Diagnosis not present

## 2017-11-25 DIAGNOSIS — E7849 Other hyperlipidemia: Secondary | ICD-10-CM

## 2017-11-25 DIAGNOSIS — T783XXD Angioneurotic edema, subsequent encounter: Secondary | ICD-10-CM

## 2017-11-25 LAB — COMPREHENSIVE METABOLIC PANEL
ALBUMIN: 3.9 g/dL (ref 3.5–5.2)
ALT: 10 U/L (ref 0–53)
AST: 11 U/L (ref 0–37)
Alkaline Phosphatase: 77 U/L (ref 39–117)
BUN: 8 mg/dL (ref 6–23)
CHLORIDE: 103 meq/L (ref 96–112)
CO2: 30 meq/L (ref 19–32)
Calcium: 9.3 mg/dL (ref 8.4–10.5)
Creatinine, Ser: 0.75 mg/dL (ref 0.40–1.50)
GFR: 110.85 mL/min (ref >60.00)
GLUCOSE: 100 mg/dL — AB (ref 70–99)
POTASSIUM: 3.6 meq/L (ref 3.5–5.1)
SODIUM: 140 meq/L (ref 135–145)
Total Bilirubin: 0.6 mg/dL (ref 0.2–1.2)
Total Protein: 7 g/dL (ref 6.0–8.3)

## 2017-11-25 LAB — HEMOGLOBIN A1C: HEMOGLOBIN A1C: 5.5 % (ref 4.6–6.5)

## 2017-11-25 MED ORDER — AMLODIPINE BESYLATE 5 MG PO TABS: 5.0000 mg | ORAL_TABLET | Freq: Every day | ORAL | 3 refills | Status: DC

## 2017-11-25 MED ORDER — HYDRALAZINE HCL 100 MG PO TABS: 100.0000 mg | ORAL_TABLET | Freq: Three times a day (TID) | ORAL | 5 refills | Status: DC

## 2017-11-25 NOTE — Patient Instructions (Signed)
  Test(s) ordered today. Your results will be released to [facility] (or called to you) after review, usually within 72hours after test completion. If any changes need to be made, you will be notified at that same time.  Medications reviewed and updated.  Changes include increasing hydralazine to 100 mg three times a day and adding amlodipine 5 mg daily   Your prescription(s) have been submitted to your pharmacy. Please take as directed and contact our office if you believe you are having problem(s) with the medication(s).    Please followup in 2 months

## 2017-11-25 NOTE — Assessment & Plan Note (Addendum)
Taking benadryl Had two recent episodes Most recent was last night and he still has some residual facial swelling that is mild and we will hold off on any steroids and he will just continue Benadryl Start benadryl nightly-advised that he should be taking this on a regular basis to hopefully help prevent future episodes Unknown cause Has seen allergy in the past

## 2017-11-25 NOTE — Assessment & Plan Note (Signed)
Uncontrolled Increase hydralazine to 100 mg TID - stressed taking three times a day Restart amlodipine 5 mg  - initially thought to cause angioedema, but has occurred off medication, so it is not the cause cmp Low sodium diet Continue weight loss efforts Start exercising

## 2017-11-25 NOTE — Assessment & Plan Note (Signed)
Continue statin Stressed the importance of starting regular exercise Continue weight loss efforts

## 2017-11-25 NOTE — Assessment & Plan Note (Signed)
Check A1c. 

## 2017-12-08 ENCOUNTER — Institutional Professional Consult (permissible substitution): Payer: Medicare HMO | Admitting: Surgery

## 2017-12-08 ENCOUNTER — Encounter: Payer: Self-pay | Admitting: Surgery

## 2017-12-08 VITALS — BP 204/90 | HR 68 | Resp 20 | Ht 65.5 in | Wt 207.0 lb

## 2017-12-08 DIAGNOSIS — I712 Thoracic aortic aneurysm, without rupture: Secondary | ICD-10-CM

## 2017-12-08 DIAGNOSIS — I7121 Aneurysm of the ascending aorta, without rupture: Secondary | ICD-10-CM

## 2017-12-08 NOTE — Progress Notes (Signed)
Cardiothoracic Surgery Consultation  PCP is Burns, [name], MD Referring Provider is [name], MD  Chief Complaint  Patient presents with  . Thoracic Aortic Aneurysm    Surgical eval, ECHO 09/15/2017    HPI:  The patient is a 67 year old gentleman with history of hypertension, hyperlipidemia, hyperglycemia, angioedema of unclear etiology, and smoking who had an echocardiogram on 09/15/2017.  This showed normal left ventricular systolic function with ejection fraction of 60 to 65% with moderate concentric left ventricular hypertrophy and grade 1 diastolic dysfunction due to uncontrolled hypertension.  It also showed enlargement of the aortic root to 40 mm and ascending aorta to 44 mm.  The aortic valve is trileaflet with some sclerosis but trivial stenosis with a mean gradient of 9 mmHg.  In reviewing his radiology records he had a CT scan of the chest done on 11/10/2004 to evaluate a palpable left lateral chest wall mass.  I reviewed the CT scan images and the ascending aorta measured 4.0 cm at that time.  The patient is retired from [name] where he works in a [name].  He continues to smoke but says that he has not smoked in the past couple days and is planning to quit. Past Medical History:  Diagnosis Date  . Angioedema   . BPV (benign positional vertigo)   . Hemorrhoids   . Hyperlipidemia   . 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 BACK SURGERY    . SHOULDER SURGERY     Right    Family History  Problem Relation Age of Onset  . Stroke Mother   . Hypertension Mother   . Heart disease Mother   . Hepatitis C Brother   . Hypertension Brother     Social History Social History   Tobacco Use  . Smoking status: Current Every Day Smoker  . Smokeless tobacco: Never Used  Substance Use Topics  . Alcohol use: Yes    Comment: infrequent  . Drug use: Yes    Types: Marijuana    Comment: occasional    Current Outpatient Medications  Medication Sig Dispense Refill  . amLODipine (NORVASC) 5 MG tablet Take 1 tablet (5 mg total) by mouth daily. 90 tablet 3  . atorvastatin (LIPITOR) 20 MG tablet Take 1 tablet (20 mg total) by mouth daily. 90 tablet 1  . hydrALAZINE (APRESOLINE) 100 MG tablet Take 1 tablet (100 mg total) by mouth 3 (three) times daily. 90 tablet 5  . clindamycin (CLINDAGEL) 1 % gel Apply topically 2 (two) times daily. (Patient not taking: Reported on 12/08/2017) 60 g 2  . EPINEPHrine (EPIPEN 2-PAK) 0.3 mg/0.3 mL IJ SOAJ injection Inject 0.3 mLs (0.3 mg total) into the muscle once as needed (for severe allergic reaction). CAll 911 immediately if you have to use this medicine (Patient not taking: Reported on 12/08/2017) 1 Device 1  . triamcinolone ointment (KENALOG) 0.5 % Apply 1 application topically 2 (two) times daily. (Patient not taking: Reported on 12/08/2017) 30 g 2   No current facility-administered medications for this visit.     Allergies  Allergen Reactions  . Ace Inhibitors Swelling    angioedema  . Penicillins Rash    Review of Systems  Constitutional: Negative.   HENT: Negative.   Eyes: Negative.   Respiratory: Negative.   Cardiovascular: Positive for leg swelling. Negative for chest pain.  Gastrointestinal: Negative.   Endocrine: Negative.   Genitourinary: Negative.   Musculoskeletal: Negative.   Allergic/Immunologic: Negative.   Neurological: Negative.   Hematological: Negative.   Psychiatric/Behavioral: Negative.     BP (!) 204/90   Pulse 68   Resp 20   Ht 5' 5.5" (1.664 m)   Wt 207 lb (93.9 kg)   SpO2 97% Comment: RA  BMI 33.92 kg/m  Physical Exam  Constitutional: He is oriented to person, place, and time. He appears well-developed and well-nourished. No distress.  Moderately obese  HENT:  Head: Normocephalic and atraumatic.  Mouth/Throat: Oropharynx is clear and moist.  Eyes: Pupils are equal,  round, and reactive to light. Conjunctivae and EOM are normal.  Neck: Normal range of motion. Neck supple. No JVD present. No thyromegaly present.  Cardiovascular: Normal rate, regular rhythm and normal heart sounds.  No murmur heard. Pulmonary/Chest: Effort normal and breath sounds normal. No respiratory distress. He has no wheezes. He has no rales.  Abdominal: Soft. Bowel sounds are normal. There is no tenderness.  Musculoskeletal: Normal range of motion. He exhibits edema.  Both ankles   Lymphadenopathy:    He has no cervical adenopathy.  Neurological: He is alert and oriented to person, place, and time.  Skin: Skin is warm and dry.  Psychiatric: He has a normal mood and affect.     Diagnostic Tests:                          [name]                        [address], [place] [address]                            [phone]  ------------------------------------------------------------------- Echocardiography  Patient:    Connor Martinez, Connor Martinez MR #:       [id number] Study Date: 09/15/2017 Gender:     M Age:        80 Height:     166.4 cm Weight:     98.2 kg BSA:        2.17 m^2 Pt. Status: Room:   SONOGRAPHER  [name]  PERFORMING   [name], Outpatient  ATTENDING    [name], [name]  REFERRING    Burns, [name]  cc:  ------------------------------------------------------------------- LV EF: 60% -   65%  ------------------------------------------------------------------- Indications:      (R94.31).  ------------------------------------------------------------------- History:   PMH:  Acquired from the patient and from the patient&'s chart.  Risk factors:  Current tobacco use. Hypertension. Obese. Dyslipidemia.  ------------------------------------------------------------------- Study Conclusions  - Left ventricle: The cavity size was normal. There was moderate   concentric  hypertrophy. Systolic function was normal. The   estimated ejection fraction was in the range of 60% to 65%. Wall   motion was normal; there were no regional wall motion   abnormalities. Doppler parameters are consistent with abnormal   left ventricular relaxation (grade 1 diastolic dysfunction). The   E/e&' ratio is >15, suggesting elevated LV filling pressure. - Aortic valve: Trileaflet. Sclerosis without stenosis. There was   no regurgitation. - Aorta: Aortic root dimension: 40 mm (ED). Ascending aortic  diameter: 44 mm (S). - Ascending aorta: The ascending aorta was moderately dilated. - Mitral valve: Mildly thickened leaflets . There was trivial   regurgitation. - Left atrium: Moderately dilated. - Right atrium: The atrium was mildly dilated. - Inferior vena cava: The vessel was normal in size. The   respirophasic diameter changes were in the normal range (>= 50%),   consistent with normal central venous pressure.  Impressions:  - Compared to a prior study in 2010, there are few changes. The   ascending aorta is dilated up to 4.4 cm. There is moderate LAE   and mild RAE.  ------------------------------------------------------------------- Labs, prior tests, procedures, and surgery: ECG.     Abnormal.  ------------------------------------------------------------------- Study data:  Comparison was made to the study of 06/06/2009.  Study status:  Routine.  Procedure:  The patient reported no pain pre or post test. Transthoracic echocardiography for left ventricular function evaluation. Image quality was adequate.  Study completion:  There were no complications.          Echocardiography.  M-mode, complete 2D, spectral Doppler, and color Doppler.  Birthdate: Patient birthdate: 09/19/1950.  Age:  Patient is 67 yr old.  Sex: Gender: male.    BMI: 35.5 kg/m^2.  Blood pressure:     156/94 Patient status:  Outpatient.  Study date:  Study date: 09/15/2017. Study time: 03:02 PM.   Location:  [facility]  -------------------------------------------------------------------  ------------------------------------------------------------------- Left ventricle:  The cavity size was normal. There was moderate concentric hypertrophy. Systolic function was normal. The estimated ejection fraction was in the range of 60% to 65%. Wall motion was normal; there were no regional wall motion abnormalities. Doppler parameters are consistent with abnormal left ventricular relaxation (grade 1 diastolic dysfunction). The E/e&' ratio is >15, suggesting elevated LV filling pressure.  ------------------------------------------------------------------- Aortic valve:   Trileaflet. Sclerosis without stenosis.  Doppler: There was no regurgitation.    VTI ratio of LVOT to aortic valve: 0.6. Valve area (VTI): 2.7 cm^2. Indexed valve area (VTI): 1.24 cm^2/m^2. Peak velocity ratio of LVOT to aortic valve: 0.59. Valve area (Vmax): 2.66 cm^2. Indexed valve area (Vmax): 1.22 cm^2/m^2. Mean velocity ratio of LVOT to aortic valve: 0.59. Valve area (Vmean): 2.67 cm^2. Indexed valve area (Vmean): 1.23 cm^2/m^2. Mean gradient (S): 9 mm Hg. Peak gradient (S): 23 mm Hg.  ------------------------------------------------------------------- Aorta:  Ascending aorta: The ascending aorta was moderately dilated.  ------------------------------------------------------------------- Mitral valve:   Mildly thickened leaflets .  Doppler:  There was trivial regurgitation.    Peak gradient (D): 3 mm Hg.  ------------------------------------------------------------------- Left atrium:  Moderately dilated.  ------------------------------------------------------------------- Atrial septum:  No defect or patent foramen ovale was identified.   ------------------------------------------------------------------- Pulmonic valve:    The valve appears to be grossly normal. Doppler:  There was no  significant regurgitation.  ------------------------------------------------------------------- Tricuspid valve:   Doppler:  There was no significant regurgitation.  ------------------------------------------------------------------- Pulmonary artery:   The main pulmonary artery was normal-sized.  ------------------------------------------------------------------- Right atrium:  The atrium was mildly dilated.  ------------------------------------------------------------------- Pericardium:  There was no pericardial effusion.  ------------------------------------------------------------------- Systemic veins: Inferior vena cava: The vessel was normal in size. The respirophasic diameter changes were in the normal range (>= 50%), consistent with normal central venous pressure.  ------------------------------------------------------------------- Measurements   Left ventricle                            Value          Reference  LV  ID, ED, PLAX chordal           (H)     56.7  mm       43 - 52  LV ID, ES, PLAX chordal                   35.8  mm       23 - 38  LV fx shortening, PLAX chordal            37    %        >=29  LV PW thickness, ED                       14.9  mm       ---------  IVS/LV PW ratio, ED                       1.19           <=1.3  Stroke volume, 2D                         114   ml       ---------  Stroke volume/bsa, 2D                     52    ml/m^2   ---------  LV e&', lateral                            7.31  cm/s     ---------  LV E/e&', lateral                          12.09          ---------  LV e&', medial                             4.48  cm/s     ---------  LV E/e&', medial                           19.73          ---------  LV e&', average                            5.9   cm/s     ---------  LV E/e&', average                          15             ---------    Ventricular septum                        Value          Reference  IVS thickness, ED                          17.8  mm       ---------    LVOT  Value          Reference  LVOT ID, S                                24    mm       ---------  LVOT area                                 4.52  cm^2     ---------  LVOT ID                                   24    mm       ---------  LVOT peak velocity, S                     140   cm/s     ---------  LVOT mean velocity, S                     78    cm/s     ---------  LVOT VTI, S                               25.3  cm       ---------  LVOT peak gradient, S                     8     mm Hg    ---------  Stroke volume (SV), LVOT DP               114.5 ml       ---------  Stroke index (SV/bsa), LVOT DP            52.7  ml/m^2   ---------    Aortic valve                              Value          Reference  Aortic valve peak velocity, S             238   cm/s     ---------  Aortic valve mean velocity, S             132   cm/s     ---------  Aortic valve VTI, S                       42.4  cm       ---------  Aortic mean gradient, S                   9     mm Hg    ---------  Aortic peak gradient, S                   23    mm Hg    ---------  VTI ratio, LVOT/AV                        0.6            ---------  Aortic valve area, VTI  2.7   cm^2     ---------  Aortic valve area/bsa, VTI                1.24  cm^2/m^2 ---------  Velocity ratio, peak, LVOT/AV             0.59           ---------  Aortic valve area, peak velocity          2.66  cm^2     ---------  Aortic valve area/bsa, peak               1.22  cm^2/m^2 ---------  velocity  Velocity ratio, mean, LVOT/AV             0.59           ---------  Aortic valve area, mean velocity          2.67  cm^2     ---------  Aortic valve area/bsa, mean               1.23  cm^2/m^2 ---------  velocity    Aorta                                     Value          Reference  Aortic root ID, ED                        40    mm       ---------   Ascending aorta ID, A-P, S                44    mm       ---------    Left atrium                               Value          Reference  LA ID, A-P, ES                            46    mm       ---------  LA ID/bsa, A-P                            2.12  cm/m^2   <=2.2  LA volume, S                              79    ml       ---------  LA volume/bsa, S                          36.4  ml/m^2   ---------  LA volume, ES, 1-p A4C                    66    ml       ---------  LA volume/bsa, ES, 1-p A4C                30.4  ml/m^2   ---------  LA volume, ES, 1-p A2C  86    ml       ---------  LA volume/bsa, ES, 1-p A2C                39.6  ml/m^2   ---------    Mitral valve                              Value          Reference  Mitral E-wave peak velocity               88.4  cm/s     ---------  Mitral A-wave peak velocity               116   cm/s     ---------  Mitral deceleration time          (H)     232   ms       150 - 230  Mitral peak gradient, D                   3     mm Hg    ---------  Mitral E/A ratio, peak                    0.8            ---------    Systemic veins                            Value          Reference  Estimated CVP                             3     mm Hg    ---------    Right ventricle                           Value          Reference  RV s&', lateral, S                         18.3  cm/s     ---------  Legend: (L)  and  (H)  mark values outside specified reference range.  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  A CT scan of the chest in 2006 showed the ascending aorta to be 4.0 cm.  I have personally reviewed both the studies.  His ascending aorta is still well below the 5.5 cm surgical  threshold.  This should be followed closely and I would plan to do a CTA of the chest in 6 months to evaluate the entire thoracic aorta.  I discussed the importance of good blood pressure control with him.  I discussed the risk of aortic dissection even with a 4.4 cm ascending aorta given his uncontrolled blood pressure.  I advised him to continue take his medications as instructed, maintain a low-sodium diet, and stop smoking.  Also advised him to get regular exercise and lose some weight.  Plan:  I will see him back in 6 months with a CTA of the chest to follow-up on his ascending  aortic aneurysm.   I spent 45 minutes performing this consultation and > 50% of this time was spent face to face counseling and coordinating the care of this patient's ascending aortic aneurysm.   Gaye Pollack, MD [facility] [phone]

## 2017-12-27 ENCOUNTER — Other Ambulatory Visit: Payer: Self-pay | Admitting: Pharmacist

## 2017-12-27 NOTE — Patient Outreach (Signed)
[facility]) Care Management  12/27/2017  Connor Martinez 01-18-1951 [id number]   Incoming call from [name] in response to the [facility] Medication Adherence Campaign. Speak with patient. HIPAA identifiers verified and verbal consent received.  Mr. Pickup reports that he takes his atorvastatin once daily as directed. Denies any missed doses or barrier to adherence. Counsel patient on the importance of medication adherence. Patient denies any medication questions/concerns at this time.  Will close pharmacy episode.   Harlow Asa, PharmD, [facility] [phone]

## 2018-01-06 ENCOUNTER — Other Ambulatory Visit: Payer: Self-pay | Admitting: Emergency Medicine

## 2018-01-06 MED ORDER — HYDRALAZINE HCL 100 MG PO TABS: 100.0000 mg | ORAL_TABLET | Freq: Three times a day (TID) | ORAL | 1 refills | Status: DC

## 2018-01-06 MED ORDER — AMLODIPINE BESYLATE 5 MG PO TABS: 5.0000 mg | ORAL_TABLET | Freq: Every day | ORAL | 1 refills | Status: DC

## 2018-01-06 MED ORDER — ATORVASTATIN CALCIUM 20 MG PO TABS: 20.0000 mg | ORAL_TABLET | Freq: Every day | ORAL | 1 refills | Status: DC

## 2018-01-07 ENCOUNTER — Other Ambulatory Visit: Payer: Self-pay

## 2018-01-07 MED ORDER — HYDRALAZINE HCL 100 MG PO TABS: 100.0000 mg | ORAL_TABLET | Freq: Three times a day (TID) | ORAL | 0 refills | Status: DC

## 2018-01-07 MED ORDER — TRIAMCINOLONE ACETONIDE 0.5 % EX OINT: 1.0000 "application " | TOPICAL_OINTMENT | Freq: Two times a day (BID) | CUTANEOUS | 1 refills | Status: DC

## 2018-01-17 ENCOUNTER — Other Ambulatory Visit: Payer: Self-pay | Admitting: Internal Medicine

## 2018-01-23 NOTE — Progress Notes (Signed)
Subjective:    Patient ID: Connor Martinez, male    DOB: 04/17/1951, 67 y.o.   MRN: [id number]  HPI The patient is here for follow up.  Hypertension: He is taking his medication daily. He is compliant with a low sodium diet.  He has mild lower extremity edema occasionally.  He denies chest pain, palpitations, shortness of breath and regular headaches. He is exercising regularly.  He has lost some weight.  He does not monitor his blood pressure at home.    Angioedema:  This morning he woke up with swelling in his left lower lip and that did progress since then and he currently has swelling in his left lower lip and cheek and right side of his tongue.   He took 2 benadryl this morning and that has helped.  He has no idea what causes the swelling.  He does have stress and feels that could be a potential cause.  This is a recurring problem.  He has seen an allergist in the past and taking allergy medication on a daily basis did not help so he does not take it regularly anymore.  He denies any trouble swallowing, shortness of breath, fevers.  Anal tag:  It has been there for about a year and a half.  He would like it removed.     Back cyst: He has a blackhead or cyst on his back and he would like to have that removed as well.  Medications and allergies reviewed with patient and updated if appropriate.  Patient Active Problem List   Diagnosis Date Noted  . Ascending aortic aneurysm ([place]) 09/19/2017  . Ear pressure, right 08/17/2017  . Abnormal EKG 08/17/2017  . Hyperglycemia 08/15/2017  . Folliculitis 10/93/2355  . Angioedema 12/03/2016  . Sialadenitis 12/03/2016  . Neck pain 12/03/2016  . Hyperlipidemia 03/22/2016  . Psoriasis 02/10/2016  . Tobacco use disorder 11/26/2015  . Uncontrolled hypertension 11/26/2015  . Obese 11/26/2015    Current Outpatient Medications on File Prior to Visit  Medication Sig Dispense Refill  . amLODipine (NORVASC) 5 MG tablet Take 1 tablet (5 mg total) by  mouth daily. 90 tablet 1  . atorvastatin (LIPITOR) 20 MG tablet Take 1 tablet (20 mg total) by mouth daily. 90 tablet 1  . clindamycin (CLINDAGEL) 1 % gel Apply topically 2 (two) times daily. 60 g 2  . EPINEPHrine (EPIPEN 2-PAK) 0.3 mg/0.3 mL IJ SOAJ injection Inject 0.3 mLs (0.3 mg total) into the muscle once as needed (for severe allergic reaction). CAll 911 immediately if you have to use this medicine 1 Device 1  . hydrALAZINE (APRESOLINE) 100 MG tablet Take 1 tablet (100 mg total) by mouth 3 (three) times daily. 270 tablet 0  . triamcinolone ointment (KENALOG) 0.5 % Apply 1 application topically 2 (two) times daily. 30 g 1  . [DISCONTINUED] lisinopril (PRINIVIL,ZESTRIL) 10 MG tablet Take 1 tablet (10 mg total) by mouth daily. 90 tablet 3   No current facility-administered medications on file prior to visit.     Past Medical History:  Diagnosis Date  . Angioedema   . BPV (benign positional vertigo)   . Hemorrhoids   . Hyperlipidemia   . 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 BACK SURGERY    . SHOULDER SURGERY     Right    Social History   Socioeconomic History  .  Marital status: Divorced    Spouse name: Not on file  . Number of children: Not on file  . Years of education: Not on file  . Highest education level: Not on file  Occupational History  . Not on file  Social Needs  . Financial resource strain: Not on file  . Food insecurity:    Worry: Not on file    Inability: Not on file  . Transportation needs:    Medical: Not on file    Non-medical: Not on file  Tobacco Use  . Smoking status: Current Every Day Smoker  . Smokeless tobacco: Never Used  Substance and Sexual Activity  . Alcohol use: Yes    Comment: infrequent  . Drug use: Yes    Types: Marijuana    Comment: occasional  . Sexual activity: Not on file  Lifestyle  . Physical activity:    Days per week:  Not on file    Minutes per session: Not on file  . Stress: Not on file  Relationships  . Social connections:    Talks on phone: Not on file    Gets together: Not on file    Attends religious service: Not on file    Active member of club or organization: Not on file    Attends meetings of clubs or organizations: Not on file    Relationship status: Not on file  Other Topics Concern  . Not on file  Social History Narrative  . Not on file    Family History  Problem Relation Age of Onset  . Stroke Mother   . Hypertension Mother   . Heart disease Mother   . Hepatitis C Brother   . Hypertension Brother     Review of Systems  Constitutional: Negative for chills and fever.  HENT: Negative for trouble swallowing.   Respiratory: Negative for cough, shortness of breath and wheezing.   Cardiovascular: Positive for leg swelling (mild, intermittent). Negative for chest pain and palpitations.  Neurological: Positive for light-headedness (with benadryl). Negative for headaches.       Objective:   Vitals:   01/24/18 1059  BP: (!) 142/82  Pulse: 80  Resp: 16  Temp: 98.1 F (36.7 C)  SpO2: 96%   BP Readings from Last 3 Encounters:  01/24/18 (!) 142/82  12/08/17 (!) 204/90  11/25/17 (!) 174/76   Wt Readings from Last 3 Encounters:  01/24/18 201 lb (91.2 kg)  12/08/17 207 lb (93.9 kg)  11/25/17 207 lb (93.9 kg)   Body mass index is 32.94 kg/m.   Physical Exam    Constitutional: Appears well-developed and well-nourished. No distress.  HENT: Left lower lip and left cheek moderately swollen, right side of tongue swollen Cardiovascular: Normal rate, regular rhythm and normal heart sounds.   2/6 systolic  murmur heard. No carotid bruit .  Mild 1 + b/l LE edema Pulmonary/Chest: Effort normal and breath sounds normal. No respiratory distress. No has no wheezes. No rales.  Skin: Skin is warm and dry. Not diaphoretic.  Psychiatric: Normal mood and affect. Behavior is normal.       Assessment & Plan:    See Problem List for Assessment and Plan of chronic medical problems.

## 2018-01-24 ENCOUNTER — Encounter: Payer: Self-pay | Admitting: Internal Medicine

## 2018-01-24 ENCOUNTER — Ambulatory Visit (INDEPENDENT_AMBULATORY_CARE_PROVIDER_SITE_OTHER): Payer: Medicare HMO | Admitting: Internal Medicine

## 2018-01-24 VITALS — BP 142/82 | HR 80 | Temp 98.1°F | Resp 16 | Wt 201.0 lb

## 2018-01-24 DIAGNOSIS — I10 Essential (primary) hypertension: Secondary | ICD-10-CM | POA: Diagnosis not present

## 2018-01-24 DIAGNOSIS — T783XXD Angioneurotic edema, subsequent encounter: Secondary | ICD-10-CM | POA: Diagnosis not present

## 2018-01-24 DIAGNOSIS — L989 Disorder of the skin and subcutaneous tissue, unspecified: Secondary | ICD-10-CM | POA: Diagnosis not present

## 2018-01-24 DIAGNOSIS — Z1211 Encounter for screening for malignant neoplasm of colon: Secondary | ICD-10-CM

## 2018-01-24 MED ORDER — PREDNISONE 10 MG PO TABS: ORAL_TABLET | ORAL | 0 refills | Status: DC

## 2018-01-24 NOTE — Assessment & Plan Note (Signed)
Blood pressure fairly controlled here today compared to previous readings We will continue current medications Continue low-sodium diet Continue regular exercise Stressed continuing weight loss efforts Follow-up in 6 months, sooner if needed

## 2018-01-24 NOTE — Assessment & Plan Note (Signed)
Back cyst and perineum skin tag Will refer to dermatology

## 2018-01-24 NOTE — Patient Instructions (Addendum)
  Medications reviewed and updated.  Changes include starting prednisone for your swelling.   Your prescription(s) have been submitted to your pharmacy. Please take as directed and contact our office if you believe you are having problem(s) with the medication(s).  A referral was ordered for dermatology and gastroenterology.     Please followup in 6 months

## 2018-01-24 NOTE — Assessment & Plan Note (Addendum)
He is having an episode of angioedema-it started this morning Possibly stress-induced.  Has seen allergy in the past Swelling of right side of tongue, left cheek and left lower lip No difficulty swallowing, shortness of breath Deferred prednisone injection Continue Benadryl Start prednisone taper ED if symptoms worsen

## 2018-04-20 DIAGNOSIS — D225 Melanocytic nevi of trunk: Secondary | ICD-10-CM | POA: Diagnosis not present

## 2018-04-20 DIAGNOSIS — D1801 Hemangioma of skin and subcutaneous tissue: Secondary | ICD-10-CM | POA: Diagnosis not present

## 2018-04-20 DIAGNOSIS — L720 Epidermal cyst: Secondary | ICD-10-CM | POA: Diagnosis not present

## 2018-04-20 DIAGNOSIS — A630 Anogenital (venereal) warts: Secondary | ICD-10-CM | POA: Diagnosis not present

## 2018-04-20 DIAGNOSIS — L858 Other specified epidermal thickening: Secondary | ICD-10-CM | POA: Diagnosis not present

## 2018-04-20 DIAGNOSIS — D2372 Other benign neoplasm of skin of left lower limb, including hip: Secondary | ICD-10-CM | POA: Diagnosis not present

## 2018-04-20 DIAGNOSIS — L218 Other seborrheic dermatitis: Secondary | ICD-10-CM | POA: Diagnosis not present

## 2018-05-24 ENCOUNTER — Other Ambulatory Visit: Payer: Self-pay | Admitting: *Deleted

## 2018-05-24 DIAGNOSIS — I712 Thoracic aortic aneurysm, without rupture, unspecified: Secondary | ICD-10-CM

## 2018-07-06 ENCOUNTER — Ambulatory Visit
Admission: RE | Admit: 2018-07-06 | Discharge: 2018-07-06 | Disposition: A | Discharge status: Home / Self Care | Payer: Medicare HMO | Source: Ambulatory Visit | Attending: Surgery | Admitting: Surgery

## 2018-07-06 ENCOUNTER — Other Ambulatory Visit: Payer: Self-pay

## 2018-07-06 ENCOUNTER — Encounter: Payer: Self-pay | Admitting: Surgery

## 2018-07-06 ENCOUNTER — Ambulatory Visit: Payer: Medicare HMO | Admitting: Surgery

## 2018-07-06 VITALS — BP 190/80 | HR 66 | Ht 65.5 in | Wt 207.2 lb

## 2018-07-06 DIAGNOSIS — I712 Thoracic aortic aneurysm, without rupture, unspecified: Secondary | ICD-10-CM

## 2018-07-06 DIAGNOSIS — I7121 Aneurysm of the ascending aorta, without rupture: Secondary | ICD-10-CM

## 2018-07-06 MED ORDER — IOPAMIDOL (ISOVUE-370) INJECTION 76%
75.0000 mL | Freq: Once | INTRAVENOUS | Status: AC | PRN
  Administered 2018-07-06: 75 mL via INTRAVENOUS

## 2018-07-06 NOTE — Progress Notes (Signed)
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  He continues to smoke at least 1 pack/day but said that he would like to quit.  He denies any chest pain or shortness of breath.  Current Outpatient Medications  Medication Sig Dispense Refill  . amLODipine (NORVASC) 5 MG tablet Take 1 tablet (5 mg total) by mouth daily. 90 tablet 1  . atorvastatin (LIPITOR) 20 MG tablet Take 1 tablet (20 mg total) by mouth daily. 90 tablet 1  . clindamycin (CLINDAGEL) 1 % gel Apply topically 2 (two) times daily. 60 g 2  . EPINEPHrine (EPIPEN 2-PAK) 0.3 mg/0.3 mL IJ SOAJ injection Inject 0.3 mLs (0.3 mg total) into the muscle once as needed (for severe allergic reaction). CAll 911 immediately if you have to use this medicine 1 Device 1  . hydrALAZINE (APRESOLINE) 100 MG tablet Take 1 tablet (100 mg total) by mouth 3 (three) times daily. 270 tablet 0  . predniSONE (DELTASONE) 10 MG tablet Take 4 tabs po qd x 3 days, then 3 tabs po qd x 3 days, then 2 tabs po qd x 3 days, then 1 tab po qd x 3 days 30 tablet 0  . triamcinolone ointment (KENALOG) 0.5 % Apply 1 application topically 2 (two) times daily. 30 g 1   No current facility-administered medications for this visit.      Physical Exam: BP (!) 190/80 (BP Location: Right Arm, Patient Position: Sitting, Cuff Size: Large)   Pulse 66   Ht 5' 5.5" (1.664 m)   Wt 207 lb 3.2 oz (94 kg)   SpO2 96% Comment: RA  BMI 33.96 kg/m  He looks well. Cardiac exam shows a regular rate and rhythm with normal heart sounds.  There is no murmur. Lungs are clear.  Diagnostic Tests:  CLINICAL DATA:  Follow-up thoracic aortic aneurysm.  EXAM: CT ANGIOGRAPHY CHEST WITH CONTRAST  TECHNIQUE: Multidetector CT imaging of the chest was performed using the standard protocol during bolus administration of intravenous contrast. Multiplanar CT image reconstructions and MIPs were obtained to evaluate the  vascular anatomy.  CONTRAST:  69mL ISOVUE-370 IOPAMIDOL (ISOVUE-370) INJECTION 76%  COMPARISON:  11/10/2004; abdominal MRI-12/09/2004  FINDINGS: Vascular Findings:  Unchanged mild fusiform aneurysmal dilatation of the ascending thoracic aorta with measurements as follows. The thoracic aorta tapers to a normal caliber at the level of the aortic arch. Scattered atherosclerotic plaque within the aortic arch, not resulting in a hemodynamically significant stenosis. The descending thoracic aorta is of normal caliber.  Conventional configuration of the aortic arch. The branch vessels of the aortic arch appear widely patent throughout their imaged courses. Note is made of a small ductus diverticulum.  Borderline cardiomegaly. No pericardial effusion. Coronary artery calcifications.  Although this examination was not tailored for the evaluation the pulmonary arteries, there are no discrete filling defects within the central pulmonary arterial tree to suggest central pulmonary embolism. Main pulmonary artery is enlarged measuring 36 mm in diameter, previously 31 mm on the 10/2004 examination.  -------------------------------------------------------------  Thoracic aortic measurements:  Sinotubular junction  35 mm as measured in greatest oblique coronal dimension.  Proximal ascending aorta  41 mm as measured in greatest oblique axial dimension at the level of the main pulmonary artery and approximately 44 mm in greatest oblique short axis coronal diameter (coronal image 54, series 10), unchanged compared to the 2006 examination.  Aortic arch aorta  32 mm as measured in greatest oblique sagittal dimension.  Proximal descending thoracic aorta  31 mm as measured in greatest oblique axial dimension at the level of the main pulmonary artery.  Distal descending thoracic aorta  31 mm as measured in greatest oblique axial dimension at the level of the  diaphragmatic hiatus.  Review of the MIP images confirms the above findings.  -------------------------------------------------------------  Non-Vascular Findings:  Mediastinum/Lymph Nodes: No bulky mediastinal, hilar or axillary lymphadenopathy.  Lungs/Pleura: Punctate (4 mm) subpleural nodules within the right upper lobe (images 43 and 44, series 7), unchanged compared to the 10/2004 examination and thus of benign etiology. Interval development of a punctate (approximately 0.3 cm) subpleural nodule within the left lower lobe (image 126, series 7).  Mild centrilobular emphysematous change. Mild centralized bronchial wall thickening however the central pulmonary airways appear patent.  No discrete focal airspace opacities. No pleural effusion or pneumothorax.  Upper abdomen: Limited early arterial phase evaluation of the upper abdomen demonstrates mixed calcified and noncalcified atherosclerotic plaque within the abdominal aorta. There is a ill-defined approximately 2.7 cm hypoattenuating lesion within the subcapsular aspect of the anterior segment of the right lobe of the liver (image 140, series 5) which demonstrates peripheral interrupted nodular enhancement and appears similar to remote abdominal MRI performed 11/2004, compatible with a benign hepatic hemangioma.  Musculoskeletal: No acute or aggressive osseous abnormalities. Stigmata of DISH throughout the thoracic spine. Post lower cervical ACDF, incompletely evaluated. Unchanged approximately 7.4 x 3.3 x 5.6 cm left lateral chest wall lipoma (axial image 77 series 59, series 10).  IMPRESSION: 1. Stable uncomplicated mild aneurysmal dilatation of the ascending thoracic aorta measuring 44 mm in diameter, similar to the 2006 examination. Aortic aneurysm NOS (ICD10-I71.9). 2. Emphysema (ICD10-J43.9). 3. Indeterminate punctate (3 mm) left lower lobe pulmonary nodule. Non-contrast chest CT can be considered in  12 months. This recommendation follows the consensus statement: Guidelines for Management of Incidental Pulmonary Nodules Detected on CT Images: From the Fleischner Society 2017; Radiology 2017; 284:228-243. 4. Coronary artery calcifications. Aortic Atherosclerosis (ICD10-I70.0). 5. Borderline cardiomegaly with enlargement of the caliber the main pulmonary artery, nonspecific though could be seen in the setting of pulmonary arterial hypertension. Further evaluation with cardiac echo could be performed as clinically indicated.   Electronically Signed   By: Sandi Mariscal M.D.   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  This is well below the 5.5 cm surgical threshold.  I reviewed the CT images with him today and answered his questions.  His blood pressure is elevated today but he said that he did not take his blood pressure medication last night on purpose so that he was see what his blood pressure was today.  I reiterated the importance of taking his blood pressure medication as prescribed and on time to maintain good blood pressure control and decrease his risk of aneurysm enlargement or aortic dissection.  I discussed the possibility of severe rebound hypertension even if he skips a dose.  He said that he does check his blood pressure at home and is usually under much better control but he did not have any specific numbers for me.  He said his blood pressure always is elevated when he goes to see a physician.  Plan:  He will continue to follow-up with his primary physician for hypertension management.  I will plan to see him back in 1 year with a CTA of the chest to follow-up on his ascending aortic aneurysm.   I spent 15  minutes performing this established patient evaluation and > 50% of this time was spent face to face counseling and coordinating the care of this patient's aortic aneurysm.    Gaye Pollack, MD [facility] [phone]

## 2018-07-27 ENCOUNTER — Ambulatory Visit (INDEPENDENT_AMBULATORY_CARE_PROVIDER_SITE_OTHER): Payer: Medicare HMO | Admitting: Internal Medicine

## 2018-07-27 ENCOUNTER — Encounter: Payer: Self-pay | Admitting: Internal Medicine

## 2018-07-27 ENCOUNTER — Other Ambulatory Visit (INDEPENDENT_AMBULATORY_CARE_PROVIDER_SITE_OTHER): Payer: Medicare HMO

## 2018-07-27 VITALS — BP 162/64 | HR 60 | Temp 98.7°F | Resp 18 | Ht 65.5 in | Wt 208.0 lb

## 2018-07-27 DIAGNOSIS — R739 Hyperglycemia, unspecified: Secondary | ICD-10-CM

## 2018-07-27 DIAGNOSIS — Z0001 Encounter for general adult medical examination with abnormal findings: Secondary | ICD-10-CM | POA: Diagnosis not present

## 2018-07-27 DIAGNOSIS — Z125 Encounter for screening for malignant neoplasm of prostate: Secondary | ICD-10-CM | POA: Diagnosis not present

## 2018-07-27 DIAGNOSIS — E7849 Other hyperlipidemia: Secondary | ICD-10-CM | POA: Diagnosis not present

## 2018-07-27 DIAGNOSIS — T783XXD Angioneurotic edema, subsequent encounter: Secondary | ICD-10-CM

## 2018-07-27 DIAGNOSIS — I10 Essential (primary) hypertension: Secondary | ICD-10-CM

## 2018-07-27 LAB — COMPREHENSIVE METABOLIC PANEL
ALT: 12 U/L (ref 0–53)
AST: 12 U/L (ref 0–37)
Albumin: 4.1 g/dL (ref 3.5–5.2)
Alkaline Phosphatase: 80 U/L (ref 39–117)
BUN: 16 mg/dL (ref 6–23)
CO2: 25 mEq/L (ref 19–32)
CREATININE: 0.74 mg/dL (ref 0.40–1.50)
Calcium: 9.3 mg/dL (ref 8.4–10.5)
Chloride: 105 mEq/L (ref 96–112)
GFR: 105.7 mL/min (ref >60.00)
Glucose, Bld: 97 mg/dL (ref 70–99)
Potassium: 3.4 mEq/L — ABNORMAL LOW (ref 3.5–5.1)
Sodium: 143 mEq/L (ref 135–145)
Total Bilirubin: 0.4 mg/dL (ref 0.2–1.2)
Total Protein: 6.9 g/dL (ref 6.0–8.3)

## 2018-07-27 LAB — CBC WITH DIFFERENTIAL/PLATELET
Basophils Absolute: 0.1 10*3/uL (ref 0.0–0.1)
Basophils Relative: 1.1 % (ref 0.0–3.0)
EOS ABS: 0.2 10*3/uL (ref 0.0–0.7)
Eosinophils Relative: 3 % (ref 0.0–5.0)
HEMATOCRIT: 42.3 % (ref 39.0–52.0)
Hemoglobin: 14.3 g/dL (ref 13.0–17.0)
LYMPHS PCT: 22.5 % (ref 12.0–46.0)
Lymphs Abs: 1.4 10*3/uL (ref 0.7–4.0)
MCHC: 33.8 g/dL (ref 30.0–36.0)
MCV: 84.1 fl (ref 78.0–100.0)
Monocytes Absolute: 0.6 10*3/uL (ref 0.1–1.0)
Monocytes Relative: 8.7 % (ref 3.0–12.0)
Neutro Abs: 4.1 10*3/uL (ref 1.4–7.7)
Neutrophils Relative %: 64.7 % (ref 43.0–77.0)
Platelets: 256 10*3/uL (ref 150.0–400.0)
RBC: 5.02 Mil/uL (ref 4.22–5.81)
RDW: 14.7 % (ref 11.5–15.5)
WBC: 6.4 10*3/uL (ref 4.0–10.5)

## 2018-07-27 LAB — LIPID PANEL
Cholesterol: 160 mg/dL (ref 0–200)
HDL: 41.2 mg/dL (ref >39.00)
LDL Cholesterol: 106 mg/dL — ABNORMAL HIGH (ref 0–99)
NonHDL: 119.26
Total CHOL/HDL Ratio: 4
Triglycerides: 67 mg/dL (ref 0.0–149.0)
VLDL: 13.4 mg/dL (ref 0.0–40.0)

## 2018-07-27 LAB — HEMOGLOBIN A1C: Hgb A1c MFr Bld: 5.4 % (ref 4.6–6.5)

## 2018-07-27 LAB — PSA, MEDICARE: PSA: 3.65 ng/ml (ref 0.10–4.00)

## 2018-07-27 LAB — TSH: TSH: 1.19 u[IU]/mL (ref 0.35–4.50)

## 2018-07-27 MED ORDER — ATORVASTATIN CALCIUM 20 MG PO TABS: 20.0000 mg | ORAL_TABLET | Freq: Every day | ORAL | 1 refills | Status: DC

## 2018-07-27 MED ORDER — AMLODIPINE BESYLATE 10 MG PO TABS: 10.0000 mg | ORAL_TABLET | Freq: Every day | ORAL | 3 refills | Status: DC

## 2018-07-27 MED ORDER — HYDRALAZINE HCL 100 MG PO TABS: 100.0000 mg | ORAL_TABLET | Freq: Three times a day (TID) | ORAL | 1 refills | Status: DC

## 2018-07-27 NOTE — Assessment & Plan Note (Signed)
Seems to be occurring less often He controls it by taking Benadryl as soon as he feels it is coming down Continue Benadryl as needed

## 2018-07-27 NOTE — Assessment & Plan Note (Addendum)
a1c Encouraged healthy diet Increase exercise and work on weight loss

## 2018-07-27 NOTE — Assessment & Plan Note (Signed)
Check lipid panel  Continue daily statin Regular exercise and healthy diet encouraged  

## 2018-07-27 NOTE — Patient Instructions (Addendum)
  Tests ordered today. Your results will be released to [place] (or called to you) after review, usually within 72hours after test completion. If any changes need to be made, you will be notified at that same time.   Medications reviewed and updated.  Changes include :   Increase amlodipine to 10 mg daily  Your prescription(s) have been submitted to your pharmacy. Please take as directed and contact our office if you believe you are having problem(s) with the medication(s).   Please followup in 6 months    Call GI to set up your colonoscopy - [phone]

## 2018-07-27 NOTE — Progress Notes (Signed)
Subjective:    Patient ID: Connor Martinez, male    DOB: November 25, 1950, 68 y.o.   MRN: [id number]  HPI The patient is here for follow up.  Hypertension: He is taking his medication daily. He is fairly compliant with a low sodium diet.  He has occasional edema with salt intake.  He denies chest pain, palpitations, shortness of breath and regular headaches. He is not exercising regularly.  He does monitor his blood pressure at home - 126-180 / ?.    Angioedema:  He has it only occasionally and is usually in the jaw.  As soon as he feels it coming he takes benadryl and he thinks that has helped.   Hyperlipidemia: He is taking his medication daily. He is compliant with a low fat/cholesterol diet. He is not exercising regularly. He denies myalgias.      Medications and allergies reviewed with patient and updated if appropriate.  Patient Active Problem List   Diagnosis Date Noted  . Skin abnormalities 01/24/2018  . Ascending aortic aneurysm ([place]) 09/19/2017  . Ear pressure, right 08/17/2017  . Abnormal EKG 08/17/2017  . Hyperglycemia 08/15/2017  . Folliculitis 70/35/0093  . Angioedema 12/03/2016  . Sialadenitis 12/03/2016  . Neck pain 12/03/2016  . Hyperlipidemia 03/22/2016  . Psoriasis 02/10/2016  . Tobacco use disorder 11/26/2015  . Uncontrolled hypertension 11/26/2015  . Obese 11/26/2015    Current Outpatient Medications on File Prior to Visit  Medication Sig Dispense Refill  . atorvastatin (LIPITOR) 20 MG tablet Take 1 tablet (20 mg total) by mouth daily. 90 tablet 1  . clindamycin (CLINDAGEL) 1 % gel Apply topically 2 (two) times daily. 60 g 2  . hydrALAZINE (APRESOLINE) 100 MG tablet Take 1 tablet (100 mg total) by mouth 3 (three) times daily. 270 tablet 0  . triamcinolone ointment (KENALOG) 0.5 % Apply 1 application topically 2 (two) times daily. 30 g 1  . [DISCONTINUED] amLODipine (NORVASC) 5 MG tablet Take 1 tablet (5 mg total) by mouth daily. 90 tablet 1  .  [DISCONTINUED] lisinopril (PRINIVIL,ZESTRIL) 10 MG tablet Take 1 tablet (10 mg total) by mouth daily. 90 tablet 3   No current facility-administered medications on file prior to visit.     Past Medical History:  Diagnosis Date  . Angioedema   . BPV (benign positional vertigo)   . Hemorrhoids   . Hyperlipidemia   . 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 BACK SURGERY    . SHOULDER SURGERY     Right    Social History   Socioeconomic History  . Marital status: Divorced    Spouse name: Not on file  . Number of children: Not on file  . Years of education: Not on file  . Highest education level: Not on file  Occupational History  . Not on file  Social Needs  . Financial resource strain: Not on file  . Food insecurity:    Worry: Not on file    Inability: Not on file  . Transportation needs:    Medical: Not on file    Non-medical: Not on file  Tobacco Use  . Smoking status: Current Every Day Smoker  . Smokeless tobacco: Never Used  Substance and Sexual Activity  . Alcohol use: Yes    Comment: infrequent  . Drug use: Yes    Types: Marijuana    Comment: occasional  .  Sexual activity: Not on file  Lifestyle  . Physical activity:    Days per week: Not on file    Minutes per session: Not on file  . Stress: Not on file  Relationships  . Social connections:    Talks on phone: Not on file    Gets together: Not on file    Attends religious service: Not on file    Active member of club or organization: Not on file    Attends meetings of clubs or organizations: Not on file    Relationship status: Not on file  Other Topics Concern  . Not on file  Social History Narrative  . Not on file    Family History  Problem Relation Age of Onset  . Stroke Mother   . Hypertension Mother   . Heart disease Mother   . Hepatitis C Brother   . Hypertension Brother     Review of  Systems  Constitutional: Negative for chills and fever.  Respiratory: Positive for wheezing (from smoking). Negative for cough and shortness of breath.   Cardiovascular: Positive for leg swelling (intermittent). Negative for chest pain and palpitations.  Neurological: Negative for light-headedness and headaches.       Objective:   Vitals:   07/27/18 1108  BP: (!) 162/64  Pulse: 60  Resp: 18  Temp: 98.7 F (37.1 C)  SpO2: 97%   BP Readings from Last 3 Encounters:  07/27/18 (!) 162/64  07/06/18 (!) 190/80  01/24/18 (!) 142/82   Wt Readings from Last 3 Encounters:  07/27/18 208 lb (94.3 kg)  07/06/18 207 lb 3.2 oz (94 kg)  01/24/18 201 lb (91.2 kg)   Body mass index is 34.09 kg/m.   Physical Exam    Constitutional: Appears well-developed and well-nourished. No distress.  HENT:  Head: Normocephalic and atraumatic.  Neck: Neck supple. No tracheal deviation present. No thyromegaly present.  No cervical lymphadenopathy Cardiovascular: Normal rate, regular rhythm and normal heart sounds.   No murmur heard. No carotid bruit .  No edema Pulmonary/Chest: Effort normal and breath sounds normal. No respiratory distress. No has no wheezes. No rales.  Skin: Skin is warm and dry. Not diaphoretic.  Psychiatric: Normal mood and affect. Behavior is normal.      Assessment & Plan:    See Problem List for Assessment and Plan of chronic medical problems.

## 2018-07-27 NOTE — Assessment & Plan Note (Signed)
BP not controlled Increase amlodipine to 10 mg  continue hydralazine at current dose - 100 mg TID cmp

## 2018-07-28 ENCOUNTER — Encounter: Payer: Self-pay | Admitting: Internal Medicine

## 2018-08-30 ENCOUNTER — Encounter: Payer: Self-pay | Admitting: Internal Medicine

## 2019-01-25 ENCOUNTER — Ambulatory Visit: Payer: Medicare HMO | Admitting: Internal Medicine

## 2019-03-05 NOTE — Progress Notes (Deleted)
Virtual Visit via Video Note  I connected with Connor Martinez on 03/05/19 at  2:00 PM EDT by a video enabled telemedicine application and verified that I am speaking with the correct person using two identifiers.   I discussed the limitations of evaluation and management by telemedicine and the availability of in person appointments. The patient expressed understanding and agreed to proceed.  The patient is currently at home and I am in the office.    No referring provider.    History of Present Illness: He is here for follow up of his chronic medical conditions.    He is exercising regularly.    Hypertension: He is taking his medication daily. He is compliant with a low sodium diet.  He denies chest pain, palpitations, edema, shortness of breath and regular headaches.  He does not monitor his blood pressure at home.    Hyperlipidemia: He is taking his medication daily. He is compliant with a low fat/cholesterol diet. He denies myalgias.   Recurrent angioedema:    Tobacco abuse:     Social History   Socioeconomic History  . Marital status: Divorced    Spouse name: Not on file  . Number of children: Not on file  . Years of education: Not on file  . Highest education level: Not on file  Occupational History  . Not on file  Social Needs  . Financial resource strain: Not on file  . Food insecurity    Worry: Not on file    Inability: Not on file  . Transportation needs    Medical: Not on file    Non-medical: Not on file  Tobacco Use  . Smoking status: Current Every Day Smoker  . Smokeless tobacco: Never Used  Substance and Sexual Activity  . Alcohol use: Yes    Comment: infrequent  . Drug use: Not Currently    Types: Marijuana    Comment: occasional  . Sexual activity: Not on file  Lifestyle  . Physical activity    Days per week: Not on file    Minutes per session: Not on file  . Stress: Not on file  Relationships  . Social Herbalist on phone: Not on  file    Gets together: Not on file    Attends religious service: Not on file    Active member of club or organization: Not on file    Attends meetings of clubs or organizations: Not on file    Relationship status: Not on file  Other Topics Concern  . Not on file  Social History Narrative  . Not on file     Observations/Objective: Appears well in NAD   Assessment and Plan:  See Problem List for Assessment and Plan of chronic medical problems.   Follow Up Instructions:    I discussed the assessment and treatment plan with the patient. The patient was provided an opportunity to ask questions and all were answered. The patient agreed with the plan and demonstrated an understanding of the instructions.   The patient was advised to call back or seek an in-person evaluation if the symptoms worsen or if the condition fails to improve as anticipated.    [name], MD

## 2019-03-06 ENCOUNTER — Ambulatory Visit: Payer: Medicare HMO | Admitting: Internal Medicine

## 2019-04-04 NOTE — Progress Notes (Deleted)
Virtual Visit via Video Note  I connected with Connor Martinez on 04/04/19 at  2:00 PM EDT by a video enabled telemedicine application and verified that I am speaking with the correct person using two identifiers.   I discussed the limitations of evaluation and management by telemedicine and the availability of in person appointments. The patient expressed understanding and agreed to proceed.  The patient is currently at home and I am in the office.    No referring provider.    History of Present Illness: He is here for follow up of his chronic medical conditions.    He is exercising regularly.    Hypertension: He is taking his medication daily. He is compliant with a low sodium diet.  He denies chest pain, palpitations, edema, shortness of breath and regular headaches.  He does not monitor his blood pressure at home.    Hyperlipidemia: He is taking his medication daily. He is compliant with a low fat/cholesterol diet. He denies myalgias.   Recurrent angioedema:  He takes benadryl as needed.    Hyperglycemia:  He is compliant with a low sugar/carbohydrate diet.  He is exercising regularly.  Tobacco abuse:    ROS   Social History   Socioeconomic History  . Marital status: Divorced    Spouse name: Not on file  . Number of children: Not on file  . Years of education: Not on file  . Highest education level: Not on file  Occupational History  . Not on file  Social Needs  . Financial resource strain: Not on file  . Food insecurity    Worry: Not on file    Inability: Not on file  . Transportation needs    Medical: Not on file    Non-medical: Not on file  Tobacco Use  . Smoking status: Current Every Day Smoker  . Smokeless tobacco: Never Used  Substance and Sexual Activity  . Alcohol use: Yes    Comment: infrequent  . Drug use: Not Currently    Types: Marijuana    Comment: occasional  . Sexual activity: Not on file  Lifestyle  . Physical activity    Days per week:  Not on file    Minutes per session: Not on file  . Stress: Not on file  Relationships  . Social Herbalist on phone: Not on file    Gets together: Not on file    Attends religious service: Not on file    Active member of club or organization: Not on file    Attends meetings of clubs or organizations: Not on file    Relationship status: Not on file  Other Topics Concern  . Not on file  Social History Narrative  . Not on file     Observations/Objective: Appears well in NAD   Assessment and Plan:  See Problem List for Assessment and Plan of chronic medical problems.   Follow Up Instructions:    I discussed the assessment and treatment plan with the patient. The patient was provided an opportunity to ask questions and all were answered. The patient agreed with the plan and demonstrated an understanding of the instructions.   The patient was advised to call back or seek an in-person evaluation if the symptoms worsen or if the condition fails to improve as anticipated.    [name], MD

## 2019-04-05 ENCOUNTER — Ambulatory Visit (INDEPENDENT_AMBULATORY_CARE_PROVIDER_SITE_OTHER): Payer: Medicare HMO | Admitting: Internal Medicine

## 2019-04-05 ENCOUNTER — Encounter: Payer: Self-pay | Admitting: Internal Medicine

## 2019-04-05 ENCOUNTER — Other Ambulatory Visit: Payer: Self-pay | Admitting: Internal Medicine

## 2019-04-05 ENCOUNTER — Other Ambulatory Visit (INDEPENDENT_AMBULATORY_CARE_PROVIDER_SITE_OTHER): Payer: Medicare HMO

## 2019-04-05 VITALS — BP 148/76 | HR 66 | Temp 98.4°F | Resp 16 | Ht 65.5 in | Wt 217.0 lb

## 2019-04-05 DIAGNOSIS — I10 Essential (primary) hypertension: Secondary | ICD-10-CM | POA: Diagnosis not present

## 2019-04-05 DIAGNOSIS — E7849 Other hyperlipidemia: Secondary | ICD-10-CM | POA: Diagnosis not present

## 2019-04-05 DIAGNOSIS — L989 Disorder of the skin and subcutaneous tissue, unspecified: Secondary | ICD-10-CM

## 2019-04-05 DIAGNOSIS — F17200 Nicotine dependence, unspecified, uncomplicated: Secondary | ICD-10-CM

## 2019-04-05 DIAGNOSIS — R739 Hyperglycemia, unspecified: Secondary | ICD-10-CM | POA: Diagnosis not present

## 2019-04-05 DIAGNOSIS — T783XXD Angioneurotic edema, subsequent encounter: Secondary | ICD-10-CM | POA: Diagnosis not present

## 2019-04-05 DIAGNOSIS — F17210 Nicotine dependence, cigarettes, uncomplicated: Secondary | ICD-10-CM | POA: Diagnosis not present

## 2019-04-05 DIAGNOSIS — R600 Localized edema: Secondary | ICD-10-CM

## 2019-04-05 LAB — COMPREHENSIVE METABOLIC PANEL
ALT: 12 U/L (ref 0–53)
AST: 13 U/L (ref 0–37)
Albumin: 4 g/dL (ref 3.5–5.2)
Alkaline Phosphatase: 87 U/L (ref 39–117)
BUN: 9 mg/dL (ref 6–23)
CO2: 27 mEq/L (ref 19–32)
Calcium: 9.1 mg/dL (ref 8.4–10.5)
Chloride: 104 mEq/L (ref 96–112)
Creatinine, Ser: 0.67 mg/dL (ref 0.40–1.50)
GFR: 118.3 mL/min (ref >60.00)
Glucose, Bld: 102 mg/dL — ABNORMAL HIGH (ref 70–99)
Potassium: 3.3 mEq/L — ABNORMAL LOW (ref 3.5–5.1)
Sodium: 140 mEq/L (ref 135–145)
Total Bilirubin: 0.4 mg/dL (ref 0.2–1.2)
Total Protein: 7.2 g/dL (ref 6.0–8.3)

## 2019-04-05 LAB — LIPID PANEL
Cholesterol: 139 mg/dL (ref 0–200)
HDL: 47.3 mg/dL (ref >39.00)
LDL Cholesterol: 84 mg/dL (ref 0–99)
NonHDL: 92.17
Total CHOL/HDL Ratio: 3
Triglycerides: 43 mg/dL (ref 0.0–149.0)
VLDL: 8.6 mg/dL (ref 0.0–40.0)

## 2019-04-05 LAB — CBC WITH DIFFERENTIAL/PLATELET
Basophils Absolute: 0.1 10*3/uL (ref 0.0–0.1)
Basophils Relative: 1.4 % (ref 0.0–3.0)
Eosinophils Absolute: 0.3 10*3/uL (ref 0.0–0.7)
Eosinophils Relative: 3.6 % (ref 0.0–5.0)
HCT: 42.3 % (ref 39.0–52.0)
Hemoglobin: 14.1 g/dL (ref 13.0–17.0)
Lymphocytes Relative: 15.8 % (ref 12.0–46.0)
Lymphs Abs: 1.2 10*3/uL (ref 0.7–4.0)
MCHC: 33.4 g/dL (ref 30.0–36.0)
MCV: 85.3 fl (ref 78.0–100.0)
Monocytes Absolute: 0.8 10*3/uL (ref 0.1–1.0)
Monocytes Relative: 10.2 % (ref 3.0–12.0)
Neutro Abs: 5.2 10*3/uL (ref 1.4–7.7)
Neutrophils Relative %: 69 % (ref 43.0–77.0)
Platelets: 232 10*3/uL (ref 150.0–400.0)
RBC: 4.96 Mil/uL (ref 4.22–5.81)
RDW: 14.8 % (ref 11.5–15.5)
WBC: 7.5 10*3/uL (ref 4.0–10.5)

## 2019-04-05 LAB — HEMOGLOBIN A1C: Hgb A1c MFr Bld: 5.5 % (ref 4.6–6.5)

## 2019-04-05 MED ORDER — HYDRALAZINE HCL 100 MG PO TABS: 100.0000 mg | ORAL_TABLET | Freq: Three times a day (TID) | ORAL | 1 refills | Status: DC

## 2019-04-05 NOTE — Assessment & Plan Note (Signed)
Check lipid panel, CMP Continue daily statin Regular exercise and healthy diet encouraged  

## 2019-04-05 NOTE — Assessment & Plan Note (Signed)
Check a1c Low sugar / carb diet Stressed regular exercise   

## 2019-04-05 NOTE — Progress Notes (Signed)
Subjective:    Patient ID: Connor Martinez, male    DOB: 1950/07/09, 68 y.o.   MRN: [id number]  HPI He is here for follow up of his chronic medical conditions.     He is not exercising regularly.    Hypertension: He is taking his medication daily. He is not compliant with a low sodium diet.  His legs have swelling for the past month.  They do not go down over night.  He denies chest pain, palpitations, shortness of breath and regular headaches.  He does monitor his blood pressure at home - usually 130's/60's - occ 140/80's.    Hyperlipidemia: He is taking his medication daily. He is not compliant with a low fat/cholesterol diet. He denies myalgias.   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  He takes benadryl as needed.  In the past 6 months he has had about 3 episodes. He thinks it is stress induced.   Hyperglycemia:  He is compliant with a low sugar/carbohydrate diet.  He is not exercising regularly.  Tobacco abuse:  He is still smoking cigarettes.  Today he has not had any cigarettes.  He smokes daily, sometimes he smokes 1.5-2 PPD.  He knows he should quit and wants to quit.  He has quit in the past, but restarted.  He thinks it would be a difficult time for him to quit because there are so many things that seem to happen that cause stress.  It seems to be one thing after another.  Obesity: He states he is not exercising regularly.  He is not eating very well and he has gained weight.   Medications and allergies reviewed with patient and updated if appropriate.  Patient Active Problem List   Diagnosis Date Noted  . Skin abnormalities 01/24/2018  . Ascending aortic aneurysm ([place]) 09/19/2017  . Ear pressure, right 08/17/2017  . Abnormal EKG 08/17/2017  . Hyperglycemia 08/15/2017  . Folliculitis 123XX123  . Angioedema 12/03/2016  . Sialadenitis 12/03/2016  . Neck pain 12/03/2016  . Hyperlipidemia 03/22/2016  . Psoriasis 02/10/2016  . Tobacco use  disorder 11/26/2015  . Uncontrolled hypertension 11/26/2015  . Obese 11/26/2015    Current Outpatient Medications on File Prior to Visit  Medication Sig Dispense Refill  . amLODipine (NORVASC) 10 MG tablet Take 1 tablet (10 mg total) by mouth daily. 90 tablet 3  . clindamycin (CLINDAGEL) 1 % gel Apply topically 2 (two) times daily. 60 g 2  . hydrALAZINE (APRESOLINE) 100 MG tablet Take 1 tablet (100 mg total) by mouth 3 (three) times daily. 270 tablet 1  . triamcinolone ointment (KENALOG) 0.5 % Apply 1 application topically 2 (two) times daily. 30 g 1  . [DISCONTINUED] lisinopril (PRINIVIL,ZESTRIL) 10 MG tablet Take 1 tablet (10 mg total) by mouth daily. 90 tablet 3   No current facility-administered medications on file prior to visit.     Past Medical History:  Diagnosis Date  . Angioedema   . BPV (benign positional vertigo)   . Hemorrhoids   . Hyperlipidemia   . 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 BACK SURGERY    . SHOULDER SURGERY     Right    Social History   Socioeconomic History  . Marital status: Divorced    Spouse name: Not on file  . Number of children: Not on file  .  Years of education: Not on file  . Highest education level: Not on file  Occupational History  . Not on file  Social Needs  . Financial resource strain: Not on file  . Food insecurity    Worry: Not on file    Inability: Not on file  . Transportation needs    Medical: Not on file    Non-medical: Not on file  Tobacco Use  . Smoking status: Current Every Day Smoker  . Smokeless tobacco: Never Used  Substance and Sexual Activity  . Alcohol use: Yes    Comment: infrequent  . Drug use: Not Currently    Types: Marijuana    Comment: occasional  . Sexual activity: Not on file  Lifestyle  . Physical activity    Days per week: Not on file    Minutes per session: Not on file  . Stress: Not  on file  Relationships  . Social Herbalist on phone: Not on file    Gets together: Not on file    Attends religious service: Not on file    Active member of club or organization: Not on file    Attends meetings of clubs or organizations: Not on file    Relationship status: Not on file  Other Topics Concern  . Not on file  Social History Narrative  . Not on file    Family History  Problem Relation Age of Onset  . Stroke Mother   . Hypertension Mother   . Heart disease Mother   . Hepatitis C Brother   . Hypertension Brother     Review of Systems  Constitutional: Negative for chills and fever.  Respiratory: Negative for cough, shortness of breath and wheezing.   Cardiovascular: Positive for leg swelling. Negative for chest pain and palpitations.  Neurological: Positive for light-headedness (occ). Negative for headaches.       Objective:   Vitals:   04/05/19 1337  BP: (!) 148/76  Pulse: 66  Resp: 16  Temp: 98.4 F (36.9 C)  SpO2: 98%   BP Readings from Last 3 Encounters:  04/05/19 (!) 148/76  07/27/18 (!) 162/64  07/06/18 (!) 190/80   Wt Readings from Last 3 Encounters:  04/05/19 217 lb (98.4 kg)  07/27/18 208 lb (94.3 kg)  07/06/18 207 lb 3.2 oz (94 kg)   Body mass index is 35.56 kg/m.   Physical Exam    Constitutional: Appears well-developed and well-nourished. No distress.  HENT:  Head: Normocephalic and atraumatic.  Neck: Neck supple. No tracheal deviation present. No thyromegaly present.  No cervical lymphadenopathy Cardiovascular: Normal rate, regular rhythm and normal heart sounds.   No murmur heard. No carotid bruit .  1+ bilateral mildly pitting lower extremity edema Pulmonary/Chest: Effort normal and breath sounds normal. No respiratory distress. No has no wheezes. No rales.  Skin: Skin is warm and dry. Not diaphoretic.  Psychiatric: Normal mood and affect. Behavior is normal.      Assessment & Plan:    See Problem List for  Assessment and Plan of chronic medical problems.

## 2019-04-05 NOTE — Assessment & Plan Note (Signed)
Referred to dermatology-he has a few skin issues

## 2019-04-05 NOTE — Patient Instructions (Addendum)
  Tests ordered today. Your results will be released to [facility] (or called to you) after review.  If any changes need to be made, you will be notified at that same time.   Medications reviewed and updated.  Changes include :   none  Your prescription(s) have been submitted to your pharmacy. Please take as directed and contact our office if you believe you are having problem(s) with the medication(s).  A referral was ordered for Dermatology  Please followup in 6  months

## 2019-04-05 NOTE — Assessment & Plan Note (Signed)
With comorbidities of hyperlipidemia, hypertension He has gained weight since he was here last Stressed the importance of weight loss Advised starting to go for walks on a daily basis-at least 30 minutes Improve diet, decrease portions Follow-up in 6 months

## 2019-04-05 NOTE — Assessment & Plan Note (Signed)
Smoking cessation was discussed for more than 3 minutes.  The patient was counseled on the dangers of tobacco use, and was advised to quit and reluctant to quit.  Reviewed ways of quitting smoking including nicotine replacement, vapping/e-cigarettes, cold [place], weaning off cigarettes, and pharmacotherapy (wellbutrin and chantix).  He does want to quit and knows he should quit, but is not 100% committed to quitting. He does not want to take any medication or use nicotine replacement.

## 2019-04-05 NOTE — Assessment & Plan Note (Signed)
Intermittent angioedema-lips and/or tongue Takes Benadryl, which eventually works Has seen more than 1 allergist in the past and no cause was found He thinks it stress-induced Has had about 3 episodes in the past 6 months Continue Benadryl as needed

## 2019-04-05 NOTE — Assessment & Plan Note (Signed)
Elevated here, but he states his blood pressure is better at home Stressed the importance of increasing exercise, weight loss and be more compliant with a low-sodium diet We will continue current medication at current doses CMP

## 2019-04-05 NOTE — Assessment & Plan Note (Signed)
Started about 1 month ago and persistent He is not exercising, has gained weight and is not compliant with a low-sodium diet Norvasc 10 mg daily is likely contributing, but he did okay with this in the past so likely not the cause Stressed regular exercise, weight loss and better compliance with a low-sodium diet CMP

## 2019-04-06 ENCOUNTER — Encounter: Payer: Self-pay | Admitting: Internal Medicine

## 2019-04-19 ENCOUNTER — Emergency Department (HOSPITAL_COMMUNITY)
Admission: EM | Admit: 2019-04-19 | Discharge: 2019-04-19 | Discharge status: Against Medical Advice | Payer: Medicare HMO | Attending: Emergency Medicine | Admitting: Emergency Medicine

## 2019-04-19 DIAGNOSIS — Z5321 Procedure and treatment not carried out due to patient leaving prior to being seen by health care provider: Secondary | ICD-10-CM | POA: Diagnosis not present

## 2019-04-19 DIAGNOSIS — A630 Anogenital (venereal) warts: Secondary | ICD-10-CM | POA: Diagnosis not present

## 2019-04-19 DIAGNOSIS — L281 Prurigo nodularis: Secondary | ICD-10-CM | POA: Diagnosis not present

## 2019-04-19 DIAGNOSIS — X32XXXA Exposure to sunlight, initial encounter: Secondary | ICD-10-CM | POA: Diagnosis not present

## 2019-04-19 DIAGNOSIS — T783XXA Angioneurotic edema, initial encounter: Secondary | ICD-10-CM | POA: Diagnosis not present

## 2019-04-19 DIAGNOSIS — L570 Actinic keratosis: Secondary | ICD-10-CM | POA: Diagnosis not present

## 2019-04-19 DIAGNOSIS — T7840XA Allergy, unspecified, initial encounter: Secondary | ICD-10-CM | POA: Diagnosis present

## 2019-06-07 ENCOUNTER — Other Ambulatory Visit: Payer: Self-pay | Admitting: Surgery

## 2019-06-07 DIAGNOSIS — I712 Thoracic aortic aneurysm, without rupture, unspecified: Secondary | ICD-10-CM

## 2019-07-07 ENCOUNTER — Other Ambulatory Visit: Payer: Self-pay | Admitting: Surgery

## 2019-07-07 DIAGNOSIS — I712 Thoracic aortic aneurysm, without rupture, unspecified: Secondary | ICD-10-CM

## 2019-07-09 ENCOUNTER — Encounter: Payer: Self-pay | Admitting: Surgery

## 2019-07-12 ENCOUNTER — Ambulatory Visit: Payer: Medicare HMO | Admitting: Surgery

## 2019-07-12 ENCOUNTER — Inpatient Hospital Stay: Admission: RE | Admit: 2019-07-12 | Payer: Medicare HMO | Source: Ambulatory Visit

## 2019-07-26 ENCOUNTER — Other Ambulatory Visit: Payer: Self-pay | Admitting: Internal Medicine

## 2019-10-04 ENCOUNTER — Ambulatory Visit: Payer: Medicare HMO | Admitting: Internal Medicine

## 2019-10-23 ENCOUNTER — Other Ambulatory Visit: Payer: Self-pay | Admitting: Internal Medicine

## 2019-10-24 ENCOUNTER — Encounter: Payer: Self-pay | Admitting: Internal Medicine

## 2019-10-24 NOTE — Progress Notes (Signed)
Subjective:    Patient ID: Connor Martinez, male    DOB: [birth year], 69 y.o.   MRN: [id number]  HPI The patient is here for follow up of their chronic medical problems, including hypertension, hyperlipidemia, hyperglycemia, tobacco abuse, obesity, recurrent angioedema.   He is taking all of his medications as prescribed, but has not taken his medications today.    He is not exercising regularly.     He has ankle swelling, which is new.  He does eat a lot of sodium.    Smoking - He has no desire to quit  - he smokes at least a pack a day.   He has angioedema about 1-2 times a month.  The minute he feels it he takes benadryl and it goes away.     His BP at home at most is 140.  He has not taken his medications today.  He feels his BP is well controlled.   Medications and allergies reviewed with patient and updated if appropriate.  Patient Active Problem List   Diagnosis Date Noted  . Bilateral lower extremity edema 04/05/2019  . Skin abnormalities 01/24/2018  . Ascending aortic aneurysm ([place]) 09/19/2017  . Ear pressure, right 08/17/2017  . Abnormal EKG 08/17/2017  . Hyperglycemia 08/15/2017  . Folliculitis 123XX123  . Angioedema 12/03/2016  . Sialadenitis 12/03/2016  . Neck pain 12/03/2016  . Hyperlipidemia 03/22/2016  . Psoriasis 02/10/2016  . Tobacco use disorder 11/26/2015  . Uncontrolled hypertension 11/26/2015  . Morbid obesity ([place]) 11/26/2015    Current Outpatient Medications on File Prior to Visit  Medication Sig Dispense Refill  . amLODipine (NORVASC) 10 MG tablet Take 1 tablet (10 mg total) by mouth daily. Need office visit for more refills. 30 tablet 0  . atorvastatin (LIPITOR) 20 MG tablet TAKE 1 TABLET BY MOUTH EVERY DAY 90 tablet 1  . clindamycin (CLINDAGEL) 1 % gel Apply topically 2 (two) times daily. 60 g 2  . hydrALAZINE (APRESOLINE) 100 MG tablet Take 1 tablet (100 mg total) by mouth 3 (three) times daily. 270 tablet 1  . triamcinolone ointment  (KENALOG) 0.5 % Apply 1 application topically 2 (two) times daily. 30 g 1  . [DISCONTINUED] lisinopril (PRINIVIL,ZESTRIL) 10 MG tablet Take 1 tablet (10 mg total) by mouth daily. 90 tablet 3   No current facility-administered medications on file prior to visit.    Past Medical History:  Diagnosis Date  . Angioedema   . BPV (benign positional vertigo)   . Hemorrhoids   . Hyperlipidemia   . 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 BACK SURGERY    . SHOULDER SURGERY     Right    Social History   Socioeconomic History  . Marital status: Divorced    Spouse name: Not on file  . Number of children: Not on file  . Years of education: Not on file  . Highest education level: Not on file  Occupational History  . Not on file  Tobacco Use  . Smoking status: Current Every Day Smoker  . Smokeless tobacco: Never Used  Substance and Sexual Activity  . Alcohol use: Yes    Comment: infrequent  . Drug use: Not Currently    Types: Marijuana    Comment: occasional  . Sexual activity: Not on file  Other Topics Concern  . Not on file  Social History Narrative  .  Not on file   Social Determinants of Health   Financial Resource Strain:   . Difficulty of Paying Living Expenses:   Food Insecurity:   . Worried About Charity fundraiser in the Last Year:   . Arboriculturist in the Last Year:   Transportation Needs:   . Film/video editor (Medical):   Marland Kitchen Lack of Transportation (Non-Medical):   Physical Activity:   . Days of Exercise per Week:   . Minutes of Exercise per Session:   Stress:   . Feeling of Stress :   Social Connections:   . Frequency of Communication with Friends and Family:   . Frequency of Social Gatherings with Friends and Family:   . Attends Religious Services:   . Active Member of Clubs or Organizations:   . Attends Archivist Meetings:   Marland Kitchen Marital  Status:     Family History  Problem Relation Age of Onset  . Stroke Mother   . Hypertension Mother   . Heart disease Mother   . Hepatitis C Brother   . Hypertension Brother     Review of Systems  Constitutional: Negative for fever.  Respiratory: Positive for shortness of breath (mild - from smoking). Negative for cough and wheezing.   Cardiovascular: Positive for leg swelling. Negative for chest pain and palpitations.  Neurological: Positive for light-headedness (occ). Negative for headaches.       Objective:   Vitals:   10/25/19 1355  BP: (!) 184/82  Pulse: 77  Resp: 16  Temp: 99.1 F (37.3 C)  SpO2: 96%   BP Readings from Last 3 Encounters:  10/25/19 (!) 184/82  04/05/19 (!) 148/76  07/27/18 (!) 162/64   Wt Readings from Last 3 Encounters:  10/25/19 215 lb 12.8 oz (97.9 kg)  04/05/19 217 lb (98.4 kg)  07/27/18 208 lb (94.3 kg)   Body mass index is 35.36 kg/m.   Physical Exam    Constitutional: Appears well-developed and well-nourished. No distress.  HENT:  Head: Normocephalic and atraumatic.  Neck: Neck supple. No tracheal deviation present. No thyromegaly present.  No cervical lymphadenopathy Cardiovascular: Normal rate, regular rhythm and normal heart sounds.   No murmur heard. No carotid bruit .  Mild b/l LE edema Pulmonary/Chest: Effort normal and breath sounds normal. No respiratory distress. No has no wheezes. No rales.  Skin: Skin is warm and dry. Not diaphoretic.  Psychiatric: Normal mood and affect. Behavior is normal.      Assessment & Plan:    See Problem List for Assessment and Plan of chronic medical problems.    This visit occurred during the SARS-CoV-2 public health emergency.  Safety protocols were in place, including screening questions prior to the visit, additional usage of staff PPE, and extensive cleaning of exam room while observing appropriate contact time as indicated for disinfecting solutions.

## 2019-10-24 NOTE — Patient Instructions (Addendum)
  Blood work was ordered.     Medications reviewed and updated.  Changes include :   none  Your prescription(s) have been submitted to your pharmacy. Please take as directed and contact our office if you believe you are having problem(s) with the medication(s).   Please followup in 6 months   

## 2019-10-25 ENCOUNTER — Other Ambulatory Visit: Payer: Self-pay

## 2019-10-25 ENCOUNTER — Ambulatory Visit (INDEPENDENT_AMBULATORY_CARE_PROVIDER_SITE_OTHER): Payer: Medicare HMO | Admitting: Internal Medicine

## 2019-10-25 ENCOUNTER — Encounter: Payer: Self-pay | Admitting: Internal Medicine

## 2019-10-25 VITALS — BP 184/82 | HR 77 | Temp 99.1°F | Resp 16 | Ht 65.5 in | Wt 215.8 lb

## 2019-10-25 DIAGNOSIS — E7849 Other hyperlipidemia: Secondary | ICD-10-CM | POA: Diagnosis not present

## 2019-10-25 DIAGNOSIS — I10 Essential (primary) hypertension: Secondary | ICD-10-CM

## 2019-10-25 DIAGNOSIS — Z125 Encounter for screening for malignant neoplasm of prostate: Secondary | ICD-10-CM | POA: Diagnosis not present

## 2019-10-25 DIAGNOSIS — F17200 Nicotine dependence, unspecified, uncomplicated: Secondary | ICD-10-CM

## 2019-10-25 DIAGNOSIS — I712 Thoracic aortic aneurysm, without rupture: Secondary | ICD-10-CM | POA: Diagnosis not present

## 2019-10-25 DIAGNOSIS — T783XXD Angioneurotic edema, subsequent encounter: Secondary | ICD-10-CM | POA: Diagnosis not present

## 2019-10-25 DIAGNOSIS — I7121 Aneurysm of the ascending aorta, without rupture: Secondary | ICD-10-CM

## 2019-10-25 DIAGNOSIS — R739 Hyperglycemia, unspecified: Secondary | ICD-10-CM

## 2019-10-25 LAB — PSA, MEDICARE: PSA: 3.99 ng/ml (ref 0.10–4.00)

## 2019-10-25 LAB — LIPID PANEL
Cholesterol: 184 mg/dL (ref 0–200)
HDL: 50.7 mg/dL (ref >39.00)
LDL Cholesterol: 117 mg/dL — ABNORMAL HIGH (ref 0–99)
NonHDL: 132.84
Total CHOL/HDL Ratio: 4
Triglycerides: 77 mg/dL (ref 0.0–149.0)
VLDL: 15.4 mg/dL (ref 0.0–40.0)

## 2019-10-25 LAB — COMPREHENSIVE METABOLIC PANEL
ALT: 14 U/L (ref 0–53)
AST: 13 U/L (ref 0–37)
Albumin: 4.1 g/dL (ref 3.5–5.2)
Alkaline Phosphatase: 85 U/L (ref 39–117)
BUN: 11 mg/dL (ref 6–23)
CO2: 29 mEq/L (ref 19–32)
Calcium: 9.1 mg/dL (ref 8.4–10.5)
Chloride: 103 mEq/L (ref 96–112)
Creatinine, Ser: 0.61 mg/dL (ref 0.40–1.50)
GFR: 131.6 mL/min (ref >60.00)
Glucose, Bld: 87 mg/dL (ref 70–99)
Potassium: 3.3 mEq/L — ABNORMAL LOW (ref 3.5–5.1)
Sodium: 140 mEq/L (ref 135–145)
Total Bilirubin: 0.5 mg/dL (ref 0.2–1.2)
Total Protein: 7.1 g/dL (ref 6.0–8.3)

## 2019-10-25 LAB — HEMOGLOBIN A1C: Hgb A1c MFr Bld: 5.4 % (ref 4.6–6.5)

## 2019-10-25 MED ORDER — AMLODIPINE BESYLATE 10 MG PO TABS: 10.0000 mg | ORAL_TABLET | Freq: Every day | ORAL | 1 refills | Status: DC

## 2019-10-25 MED ORDER — HYDRALAZINE HCL 100 MG PO TABS: 100.0000 mg | ORAL_TABLET | Freq: Three times a day (TID) | ORAL | 1 refills | Status: DC

## 2019-10-25 MED ORDER — ATORVASTATIN CALCIUM 20 MG PO TABS: 20.0000 mg | ORAL_TABLET | Freq: Every day | ORAL | 1 refills | Status: DC

## 2019-10-25 NOTE — Assessment & Plan Note (Signed)
Chronic Check lipid panel  Continue daily statin Regular exercise and healthy diet encouraged  

## 2019-10-25 NOTE — Assessment & Plan Note (Signed)
BMI > 35 with htn, hyperlipidemia Stressed weight loss Start regular exercise Improve diet, dec salt intake Fu in 6 months

## 2019-10-25 NOTE — Assessment & Plan Note (Signed)
Chronic Recurrent Occurs 1-2 times a month Easily controlled with benadryl Has seen allergy in the past - no known cause

## 2019-10-25 NOTE — Assessment & Plan Note (Signed)
Smokes at least one pack a day - does not want to quit

## 2019-10-25 NOTE — Assessment & Plan Note (Signed)
Chronic BP controlled at home, but always high here -- did not take medications today - ? Truly controlled or not Current regimen effective and well tolerated Continue current medications at current doses cmp

## 2019-10-25 NOTE — Assessment & Plan Note (Signed)
Monitored by Dr Cyndia Bent

## 2019-10-25 NOTE — Assessment & Plan Note (Addendum)
Chronic Check a1c Low sugar / carb diet Stressed regular exercise Advised weight loss 

## 2019-10-26 ENCOUNTER — Encounter: Payer: Self-pay | Admitting: Internal Medicine

## 2019-10-26 ENCOUNTER — Other Ambulatory Visit: Payer: Self-pay | Admitting: Internal Medicine

## 2019-10-26 MED ORDER — POTASSIUM CHLORIDE CRYS ER 20 MEQ PO TBCR: 20.0000 meq | EXTENDED_RELEASE_TABLET | Freq: Every day | ORAL | 1 refills | Status: DC

## 2020-02-05 ENCOUNTER — Telehealth: Payer: Self-pay | Admitting: Internal Medicine

## 2020-02-05 NOTE — Telephone Encounter (Signed)
[facility] Nurse called, Patient refusing 911 call.  Patient has SOB on exertion, chest pain, and pain radiating down back on exertion, high BP  Called patient to schedule a 12:40 pm appointment on 8.17.21

## 2020-02-05 NOTE — Telephone Encounter (Signed)
   Patient calling to report chest pain x1 week, and swollen feet Patient requesting echo  Call transferred to [facility] for immediate advice

## 2020-02-06 ENCOUNTER — Other Ambulatory Visit: Payer: Medicare HMO

## 2020-02-06 ENCOUNTER — Ambulatory Visit (INDEPENDENT_AMBULATORY_CARE_PROVIDER_SITE_OTHER)
Admission: RE | Admit: 2020-02-06 | Discharge: 2020-02-06 | Disposition: A | Discharge status: Home / Self Care | Payer: Medicare HMO | Source: Ambulatory Visit | Attending: Family | Admitting: Family

## 2020-02-06 ENCOUNTER — Encounter: Payer: Self-pay | Admitting: Family

## 2020-02-06 ENCOUNTER — Other Ambulatory Visit: Payer: Self-pay

## 2020-02-06 ENCOUNTER — Telehealth: Payer: Self-pay

## 2020-02-06 ENCOUNTER — Ambulatory Visit (INDEPENDENT_AMBULATORY_CARE_PROVIDER_SITE_OTHER): Payer: Medicare HMO | Admitting: Family

## 2020-02-06 VITALS — BP 116/62 | HR 67 | Temp 98.2°F | Ht 65.5 in | Wt 211.0 lb

## 2020-02-06 DIAGNOSIS — R079 Chest pain, unspecified: Secondary | ICD-10-CM

## 2020-02-06 DIAGNOSIS — R9431 Abnormal electrocardiogram [ECG] [EKG]: Secondary | ICD-10-CM

## 2020-02-06 DIAGNOSIS — I517 Cardiomegaly: Secondary | ICD-10-CM | POA: Diagnosis not present

## 2020-02-06 LAB — COMPREHENSIVE METABOLIC PANEL
ALT: 16 U/L (ref 0–53)
AST: 36 U/L (ref 0–37)
Albumin: 3.9 g/dL (ref 3.5–5.2)
Alkaline Phosphatase: 71 U/L (ref 39–117)
BUN: 11 mg/dL (ref 6–23)
CO2: 26 mEq/L (ref 19–32)
Calcium: 9.1 mg/dL (ref 8.4–10.5)
Chloride: 104 mEq/L (ref 96–112)
Creatinine, Ser: 0.74 mg/dL (ref 0.40–1.50)
GFR: 105.21 mL/min (ref >60.00)
Glucose, Bld: 103 mg/dL — ABNORMAL HIGH (ref 70–99)
Potassium: 3.3 mEq/L — ABNORMAL LOW (ref 3.5–5.1)
Sodium: 139 mEq/L (ref 135–145)
Total Bilirubin: 0.6 mg/dL (ref 0.2–1.2)
Total Protein: 6.9 g/dL (ref 6.0–8.3)

## 2020-02-06 LAB — TROPONIN I (HIGH SENSITIVITY): High Sens Troponin I: 2985 ng/L (ref 2–17)

## 2020-02-06 NOTE — Telephone Encounter (Signed)
CRITICAL VALUE STICKER  CRITICAL VALUE:  Troponin   RECEIVER (on-site recipient of call): Elza Rafter  DATE & TIME NOTIFIED: 02/06/20 at 1452  MESSENGER (representative from lab): Hope  MD NOTIFIED: [place]  TIME OF NOTIFICATION: 9199  RESPONSE: instructed to inform pt to go the the ED.    All 3 numbers on file called, lvm instructing pt to go to ED immediately.  Also called [facility] (emergency contacts) & go no answer or option to leave voicemail.  Will continue to call.  1509: Have called mobile number x 3

## 2020-02-06 NOTE — Progress Notes (Signed)
Connor Martinez is a 69 y.o. male with the following history as recorded in [facility]:  Patient Active Problem List   Diagnosis Date Noted  . Bilateral lower extremity edema 04/05/2019  . Skin abnormalities 01/24/2018  . Ascending aortic aneurysm ([place]) 09/19/2017  . Ear pressure, right 08/17/2017  . Abnormal EKG 08/17/2017  . Hyperglycemia 08/15/2017  . Folliculitis 02/54/2706  . Angioedema 12/03/2016  . Sialadenitis 12/03/2016  . Neck pain 12/03/2016  . Hyperlipidemia 03/22/2016  . Psoriasis 02/10/2016  . Tobacco use disorder 11/26/2015  . Uncontrolled hypertension 11/26/2015  . Morbid obesity ([place]) 11/26/2015    Current Outpatient Medications  Medication Sig Dispense Refill  . amLODipine (NORVASC) 10 MG tablet Take 1 tablet (10 mg total) by mouth daily. 90 tablet 1  . atorvastatin (LIPITOR) 20 MG tablet Take 1 tablet (20 mg total) by mouth daily. 90 tablet 1  . clindamycin (CLINDAGEL) 1 % gel Apply topically 2 (two) times daily. 60 g 2  . hydrALAZINE (APRESOLINE) 100 MG tablet Take 1 tablet (100 mg total) by mouth 3 (three) times daily. 270 tablet 1  . potassium chloride SA (KLOR-CON) 20 MEQ tablet Take 1 tablet (20 mEq total) by mouth daily. 90 tablet 1  . triamcinolone ointment (KENALOG) 0.5 % Apply 1 application topically 2 (two) times daily. 30 g 1   No current facility-administered medications for this visit.    Allergies: Ace inhibitors and Penicillins  Past Medical History:  Diagnosis Date  . Angioedema   . BPV (benign positional vertigo)   . Hemorrhoids   . Hyperlipidemia   . 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 BACK SURGERY    . SHOULDER SURGERY     Right    Family History  Problem Relation Age of Onset  . Stroke Mother   . Hypertension Mother   . Heart disease Mother   . Hepatitis C Brother   . Hypertension Brother     Social History   Tobacco Use   . Smoking status: Current Every Day Smoker  . Smokeless tobacco: Never Used  Substance Use Topics  . Alcohol use: Yes    Comment: infrequent    Subjective:  Patient called our office yesterday with complaint of chest pain, shortness of breath of exertion; he notes the pain had been there for at least 1 week; was recommended to go to the ER- he declined; chose instead to make an office visit 24 hours later to discuss; notes he is feeling better today;  is requesting a referral to cardiology only; does agree to do labs and CXR today;   Objective:  Vitals:   02/06/20 1224  BP: 116/62  Pulse: 67  Temp: 98.2 F (36.8 C)  TempSrc: Oral  SpO2: 96%  Weight: 211 lb (95.7 kg)  Height: 5' 5.5" (1.664 m)    General: Well developed, well nourished, in no acute distress  Skin : Warm and dry.  Head: Normocephalic and atraumatic  Lungs: Respirations unlabored; clear to auscultation bilaterally without wheeze, rales, rhonchi  CVS exam: normal rate and regular rhythm.  Neurologic: Alert and oriented; speech intact; face symmetrical; moves all extremities well; CNII-XII intact without focal deficit   Assessment:  1. Chest pain, unspecified type   2. Abnormal EKG     Plan:  Patient notes he is feeling better today- defers considering any type of ER evaluation; EKG  shows sinus rhythm with LBBB- similar to what was seen in 2019; he agrees to updating CXR and labs today; referral to cardiology updated as he is requesting; stressed need to quit smoking;  This visit occurred during the SARS-CoV-2 public health emergency.  Safety protocols were in place, including screening questions prior to the visit, additional usage of staff PPE, and extensive cleaning of exam room while observing appropriate contact time as indicated for disinfecting solutions.     No follow-ups on file.  Orders Placed This Encounter  Procedures  . DG Chest 2 View    Standing Status:   Future    Standing Expiration Date:    02/05/2021    Order Specific Question:   Reason for Exam (SYMPTOM  OR DIAGNOSIS REQUIRED)    Answer:   chest pain    Order Specific Question:   Preferred imaging location?    Answer:   [facility]    Order Specific Question:   Radiology Contrast Protocol - do NOT remove file path    Answer:   [email or web address]  . D-Dimer, Quantitative    Standing Status:   Future    Number of Occurrences:   1    Standing Expiration Date:   02/05/2021  . Troponin I    Standing Status:   Future    Number of Occurrences:   1    Standing Expiration Date:   02/05/2021  . Comp Met (CMET)    Standing Status:   Future    Number of Occurrences:   1    Standing Expiration Date:   02/05/2021  . Ambulatory referral to Cardiology    Referral Priority:   Routine    Referral Type:   Consultation    Referral Reason:   Specialty Services Required    Requested Specialty:   Cardiology    Number of Visits Requested:   1  . EKG 12-Lead    Requested Prescriptions    No prescriptions requested or ordered in this encounter

## 2020-02-06 NOTE — Telephone Encounter (Signed)
Patient told yesterday to call 911 and refused. He has appointment to see Mickel Baas today and based on symptoms will more than likely be sent to the ER.

## 2020-02-06 NOTE — Telephone Encounter (Signed)
[facility] Report/Call : ---Caller states he is having chest pain for the last week. Constant chest pain, no matter how he sits he can't get comfortable. SOB with exertion. No other symptoms.  Advised call EMS 911 now. Patient refused.

## 2020-02-07 ENCOUNTER — Ambulatory Visit (HOSPITAL_COMMUNITY)
Admission: EM | Admit: 2020-02-07 | Discharge: 2020-02-07 | Disposition: A | Discharge status: Home / Self Care | Payer: Medicare HMO

## 2020-02-07 ENCOUNTER — Inpatient Hospital Stay (HOSPITAL_COMMUNITY)
Admission: EM | Admit: 2020-02-07 | Discharge: 2020-02-10 | DRG: 246 | Disposition: A | Discharge status: Home / Self Care | Payer: Medicare HMO | Attending: Cardiovascular Disease | Admitting: Cardiovascular Disease

## 2020-02-07 ENCOUNTER — Emergency Department (HOSPITAL_COMMUNITY): Payer: Medicare HMO

## 2020-02-07 ENCOUNTER — Other Ambulatory Visit: Payer: Self-pay

## 2020-02-07 DIAGNOSIS — I2510 Atherosclerotic heart disease of native coronary artery without angina pectoris: Secondary | ICD-10-CM | POA: Diagnosis not present

## 2020-02-07 DIAGNOSIS — I77811 Abdominal aortic ectasia: Secondary | ICD-10-CM

## 2020-02-07 DIAGNOSIS — Z20822 Contact with and (suspected) exposure to covid-19: Secondary | ICD-10-CM | POA: Diagnosis present

## 2020-02-07 DIAGNOSIS — E669 Obesity, unspecified: Secondary | ICD-10-CM | POA: Diagnosis present

## 2020-02-07 DIAGNOSIS — I255 Ischemic cardiomyopathy: Secondary | ICD-10-CM | POA: Diagnosis present

## 2020-02-07 DIAGNOSIS — I214 Non-ST elevation (NSTEMI) myocardial infarction: Secondary | ICD-10-CM | POA: Diagnosis present

## 2020-02-07 DIAGNOSIS — I714 Abdominal aortic aneurysm, without rupture: Secondary | ICD-10-CM

## 2020-02-07 DIAGNOSIS — E119 Type 2 diabetes mellitus without complications: Secondary | ICD-10-CM | POA: Diagnosis present

## 2020-02-07 DIAGNOSIS — F17200 Nicotine dependence, unspecified, uncomplicated: Secondary | ICD-10-CM | POA: Diagnosis present

## 2020-02-07 DIAGNOSIS — I25110 Atherosclerotic heart disease of native coronary artery with unstable angina pectoris: Secondary | ICD-10-CM | POA: Diagnosis present

## 2020-02-07 DIAGNOSIS — R079 Chest pain, unspecified: Secondary | ICD-10-CM | POA: Diagnosis not present

## 2020-02-07 DIAGNOSIS — Z8249 Family history of ischemic heart disease and other diseases of the circulatory system: Secondary | ICD-10-CM

## 2020-02-07 DIAGNOSIS — K449 Diaphragmatic hernia without obstruction or gangrene: Secondary | ICD-10-CM | POA: Diagnosis not present

## 2020-02-07 DIAGNOSIS — Z823 Family history of stroke: Secondary | ICD-10-CM | POA: Diagnosis not present

## 2020-02-07 DIAGNOSIS — I5021 Acute systolic (congestive) heart failure: Secondary | ICD-10-CM | POA: Diagnosis present

## 2020-02-07 DIAGNOSIS — J9811 Atelectasis: Secondary | ICD-10-CM | POA: Diagnosis not present

## 2020-02-07 DIAGNOSIS — R001 Bradycardia, unspecified: Secondary | ICD-10-CM | POA: Diagnosis present

## 2020-02-07 DIAGNOSIS — I700 Atherosclerosis of aorta: Secondary | ICD-10-CM | POA: Diagnosis not present

## 2020-02-07 DIAGNOSIS — Z955 Presence of coronary angioplasty implant and graft: Secondary | ICD-10-CM

## 2020-02-07 DIAGNOSIS — I471 Supraventricular tachycardia: Secondary | ICD-10-CM | POA: Diagnosis present

## 2020-02-07 DIAGNOSIS — E876 Hypokalemia: Secondary | ICD-10-CM | POA: Diagnosis present

## 2020-02-07 DIAGNOSIS — I7121 Aneurysm of the ascending aorta, without rupture: Secondary | ICD-10-CM

## 2020-02-07 DIAGNOSIS — E785 Hyperlipidemia, unspecified: Secondary | ICD-10-CM | POA: Diagnosis present

## 2020-02-07 DIAGNOSIS — I110 Hypertensive heart disease with heart failure: Secondary | ICD-10-CM | POA: Diagnosis present

## 2020-02-07 DIAGNOSIS — I712 Thoracic aortic aneurysm, without rupture: Secondary | ICD-10-CM | POA: Diagnosis present

## 2020-02-07 DIAGNOSIS — K429 Umbilical hernia without obstruction or gangrene: Secondary | ICD-10-CM | POA: Diagnosis not present

## 2020-02-07 LAB — CBC
HCT: 41.3 % (ref 39.0–52.0)
Hemoglobin: 13.6 g/dL (ref 13.0–17.0)
MCH: 28.4 pg (ref 26.0–34.0)
MCHC: 32.9 g/dL (ref 30.0–36.0)
MCV: 86.2 fL (ref 80.0–100.0)
Platelets: 221 10*3/uL (ref 150–400)
RBC: 4.79 MIL/uL (ref 4.22–5.81)
RDW: 14.7 % (ref 11.5–15.5)
WBC: 9.9 10*3/uL (ref 4.0–10.5)
nRBC: 0 % (ref 0.0–0.2)

## 2020-02-07 LAB — BASIC METABOLIC PANEL
Anion gap: 11 (ref 5–15)
BUN: 9 mg/dL (ref 8–23)
CO2: 24 mmol/L (ref 22–32)
Calcium: 9.1 mg/dL (ref 8.9–10.3)
Chloride: 106 mmol/L (ref 98–111)
Creatinine, Ser: 0.72 mg/dL (ref 0.61–1.24)
GFR calc Af Amer: >60 mL/min (ref >60)
GFR calc non Af Amer: >60 mL/min (ref >60)
Glucose, Bld: 109 mg/dL — ABNORMAL HIGH (ref 70–99)
Potassium: 3.1 mmol/L — ABNORMAL LOW (ref 3.5–5.1)
Sodium: 141 mmol/L (ref 135–145)

## 2020-02-07 LAB — TROPONIN I (HIGH SENSITIVITY)
Troponin I (High Sensitivity): 2436 ng/L (ref <18)
Troponin I (High Sensitivity): 2591 ng/L (ref <18)

## 2020-02-07 LAB — TSH: TSH: 2.083 u[IU]/mL (ref 0.350–4.500)

## 2020-02-07 LAB — HEMOGLOBIN A1C
Hgb A1c MFr Bld: 5.3 % (ref 4.8–5.6)
Mean Plasma Glucose: 105.41 mg/dL

## 2020-02-07 LAB — HIV ANTIBODY (ROUTINE TESTING W REFLEX): HIV Screen 4th Generation wRfx: NONREACTIVE

## 2020-02-07 LAB — MAGNESIUM: Magnesium: 1.9 mg/dL (ref 1.7–2.4)

## 2020-02-07 LAB — SARS CORONAVIRUS 2 BY RT PCR (HOSPITAL ORDER, PERFORMED IN ~~LOC~~ HOSPITAL LAB): SARS Coronavirus 2: NEGATIVE

## 2020-02-07 LAB — D-DIMER, QUANTITATIVE: D-Dimer, Quant: 0.45 mcg/mL FEU (ref <0.50)

## 2020-02-07 MED ORDER — ONDANSETRON HCL 4 MG/2ML IJ SOLN: 4.0000 mg | Freq: Four times a day (QID) | INTRAMUSCULAR | Status: DC | PRN

## 2020-02-07 MED ORDER — IOHEXOL 350 MG/ML SOLN
100.0000 mL | Freq: Once | INTRAVENOUS | Status: AC | PRN
  Administered 2020-02-07: 100 mL via INTRAVENOUS

## 2020-02-07 MED ORDER — NITROGLYCERIN 0.4 MG SL SUBL: 0.4000 mg | SUBLINGUAL_TABLET | SUBLINGUAL | Status: DC | PRN

## 2020-02-07 MED ORDER — AMLODIPINE BESYLATE 10 MG PO TABS
10.0000 mg | ORAL_TABLET | Freq: Every day | ORAL | Status: DC
  Administered 2020-02-07 – 2020-02-10 (×3): 10 mg via ORAL
  Filled 2020-02-07: qty 1
  Filled 2020-02-07: qty 2
  Filled 2020-02-07: qty 1

## 2020-02-07 MED ORDER — SODIUM CHLORIDE 0.9 % IV SOLN: 250.0000 mL | INTRAVENOUS | Status: DC | PRN

## 2020-02-07 MED ORDER — DIPHENHYDRAMINE HCL 25 MG PO CAPS
25.0000 mg | ORAL_CAPSULE | Freq: Four times a day (QID) | ORAL | Status: DC | PRN
  Administered 2020-02-08: 25 mg via ORAL
  Filled 2020-02-07 (×2): qty 1

## 2020-02-07 MED ORDER — POTASSIUM CHLORIDE CRYS ER 20 MEQ PO TBCR
40.0000 meq | EXTENDED_RELEASE_TABLET | Freq: Once | ORAL | Status: AC
  Administered 2020-02-07: 40 meq via ORAL
  Filled 2020-02-07: qty 2

## 2020-02-07 MED ORDER — HEPARIN (PORCINE) 25000 UT/250ML-% IV SOLN
1400.0000 [IU]/h | INTRAVENOUS | Status: DC
  Administered 2020-02-07: 1100 [IU]/h via INTRAVENOUS
  Filled 2020-02-07 (×2): qty 250

## 2020-02-07 MED ORDER — ACETAMINOPHEN 325 MG PO TABS: 650.0000 mg | ORAL_TABLET | ORAL | Status: DC | PRN

## 2020-02-07 MED ORDER — SODIUM CHLORIDE 0.9% FLUSH
3.0000 mL | Freq: Two times a day (BID) | INTRAVENOUS | Status: DC
  Administered 2020-02-07 – 2020-02-09 (×4): 3 mL via INTRAVENOUS

## 2020-02-07 MED ORDER — ASPIRIN EC 81 MG PO TBEC: 81.0000 mg | DELAYED_RELEASE_TABLET | Freq: Every day | ORAL | Status: DC

## 2020-02-07 MED ORDER — METOPROLOL TARTRATE 25 MG PO TABS
25.0000 mg | ORAL_TABLET | Freq: Two times a day (BID) | ORAL | Status: DC
  Administered 2020-02-07: 25 mg via ORAL
  Filled 2020-02-07: qty 1

## 2020-02-07 MED ORDER — SODIUM CHLORIDE 0.9% FLUSH
3.0000 mL | Freq: Two times a day (BID) | INTRAVENOUS | Status: DC
  Administered 2020-02-07: 3 mL via INTRAVENOUS

## 2020-02-07 MED ORDER — HEPARIN BOLUS VIA INFUSION
4000.0000 [IU] | Freq: Once | INTRAVENOUS | Status: AC
  Administered 2020-02-07: 4000 [IU] via INTRAVENOUS
  Filled 2020-02-07: qty 4000

## 2020-02-07 MED ORDER — POTASSIUM CHLORIDE CRYS ER 20 MEQ PO TBCR
20.0000 meq | EXTENDED_RELEASE_TABLET | Freq: Every day | ORAL | Status: DC
  Administered 2020-02-07: 20 meq via ORAL
  Filled 2020-02-07: qty 1

## 2020-02-07 MED ORDER — ATORVASTATIN CALCIUM 80 MG PO TABS
80.0000 mg | ORAL_TABLET | Freq: Every day | ORAL | Status: DC
  Administered 2020-02-07 – 2020-02-10 (×3): 80 mg via ORAL
  Filled 2020-02-07 (×3): qty 1

## 2020-02-07 MED ORDER — ALPRAZOLAM 0.25 MG PO TABS: 0.2500 mg | ORAL_TABLET | Freq: Two times a day (BID) | ORAL | Status: DC | PRN

## 2020-02-07 MED ORDER — ASPIRIN 300 MG RE SUPP: 300.0000 mg | RECTAL | Status: DC

## 2020-02-07 MED ORDER — ZOLPIDEM TARTRATE 5 MG PO TABS: 5.0000 mg | ORAL_TABLET | Freq: Every evening | ORAL | Status: DC | PRN

## 2020-02-07 MED ORDER — SODIUM CHLORIDE 0.9% FLUSH: 3.0000 mL | INTRAVENOUS | Status: DC | PRN

## 2020-02-07 MED ORDER — ASPIRIN 81 MG PO CHEW: 324.0000 mg | CHEWABLE_TABLET | ORAL | Status: DC

## 2020-02-07 MED ORDER — ASPIRIN 81 MG PO CHEW
324.0000 mg | CHEWABLE_TABLET | Freq: Once | ORAL | Status: AC
  Administered 2020-02-07: 324 mg via ORAL
  Filled 2020-02-07: qty 4

## 2020-02-07 NOTE — Progress Notes (Signed)
ANTICOAGULATION CONSULT NOTE - Initial Consult  Pharmacy Consult for heparin Indication: chest pain/ACS  Allergies  Allergen Reactions  . Ace Inhibitors Swelling    angioedema  . Penicillins Rash    Patient Measurements:   Heparin Dosing Weight: 83.5kg  Vital Signs: Temp: 98.3 F (36.8 C) (08/18 1432) Temp Source: Oral (08/18 1432) BP: 133/81 (08/18 1630) Pulse Rate: 68 (08/18 1651)  Labs: Recent Labs    02/06/20 1315 02/07/20 1249 02/07/20 1454  HGB  --  13.6  --   HCT  --  41.3  --   PLT  --  221  --   CREATININE 0.74 0.72  --   TROPONINIHS  --  2,436* 2,591*    Estimated Creatinine Clearance: 96.2 mL/min (by C-G formula based on SCr of 0.72 mg/dL).   Medical History: Past Medical History:  Diagnosis Date  . Angioedema   . BPV (benign positional vertigo)   . Hemorrhoids   . Hyperlipidemia   . Sciatica    MRI with disc herniations L3-4, L4-5 s/p epidural injections by Dr [name]    Medications:  Infusions:  . heparin      Assessment: 22 yom presented to the ED with CP. Troponin elevated and now starting IV heparin. Baseline CBC is WNL. He is not on anticoagulation PTA.   Goal of Therapy:  Heparin level 0.3-0.7 units/ml Monitor platelets by anticoagulation protocol: Yes   Plan:  Heparin bolus 4000 units IV  1 Heparin gtt 1100 units/hr Check a 6 hr heparin level Daily heparin level and CBC  Jostin Rue, Rande Lawman 02/07/2020,5:22 PM

## 2020-02-07 NOTE — ED Provider Notes (Signed)
[facility] EMERGENCY DEPARTMENT Provider Note   CSN: [id number] Arrival date & time: 02/07/20  1156     History Chief Complaint  Patient presents with  . Chest Pain    Connor Martinez is a 69 y.o. male who  has a past medical history of Angioedema, BPV (benign positional vertigo), Hemorrhoids, Hyperlipidemia, Sciatica, Ascending Aortic Aneurysm, Hyperglycemia, tobacco abuse, hyperlipidemia, hypertension, and peripheral edema. He presents with a cc of chest pain. The patient reports that six days ago he began having "the worst pain I have ever had" in his left chest. The pain was radiating to his left upper back between his shoulder blades. He took 2 asa and drank water. The pain continued, waxing and waning, but progressively worsening for 4 days.  The next day he decided to see his pcp and yesterday his Troponin came back elevated to 2900 and his PCP was unable to contact him.  Apparently the patient had financial concerns about coming to the emergency department and he states that the pain is nonexertional.   HPI     Past Medical History:  Diagnosis Date  . Angioedema   . BPV (benign positional vertigo)   . Hemorrhoids   . Hyperlipidemia   . Sciatica    MRI with disc herniations L3-4, L4-5 s/p epidural injections by Dr [name]    Patient Active Problem List   Diagnosis Date Noted  . Bilateral lower extremity edema 04/05/2019  . Skin abnormalities 01/24/2018  . Ascending aortic aneurysm ([place]) 09/19/2017  . Ear pressure, right 08/17/2017  . Abnormal EKG 08/17/2017  . Hyperglycemia 08/15/2017  . Folliculitis 54/65/6812  . Angioedema 12/03/2016  . Sialadenitis 12/03/2016  . Neck pain 12/03/2016  . Hyperlipidemia 03/22/2016  . Psoriasis 02/10/2016  . Tobacco use disorder 11/26/2015  . Uncontrolled hypertension 11/26/2015  . Morbid obesity ([place]) 11/26/2015    Past Surgical History:  Procedure Laterality Date  . BACK SURGERY    . SHOULDER SURGERY      Right       Family History  Problem Relation Age of Onset  . Stroke Mother   . Hypertension Mother   . Heart disease Mother   . Hepatitis C Brother   . Hypertension Brother     Social History   Tobacco Use  . Smoking status: Current Every Day Smoker  . Smokeless tobacco: Never Used  Substance Use Topics  . Alcohol use: Yes    Comment: infrequent  . Drug use: Not Currently    Types: Marijuana    Comment: occasional    Home Medications Prior to Admission medications   Medication Sig Start Date End Date Taking? Authorizing Provider  amLODipine (NORVASC) 10 MG tablet Take 1 tablet (10 mg total) by mouth daily. 10/25/19  Yes Burns, [name], MD  atorvastatin (LIPITOR) 20 MG tablet Take 1 tablet (20 mg total) by mouth daily. 10/25/19  Yes Burns, [name], MD  diphenhydrAMINE (BENADRYL) 25 MG tablet Take 25 mg by mouth every 6 (six) hours as needed (swelling).   Yes [provider]  hydrALAZINE (APRESOLINE) 100 MG tablet Take 1 tablet (100 mg total) by mouth 3 (three) times daily. 10/25/19  Yes Burns, [name], MD  potassium chloride SA (KLOR-CON) 20 MEQ tablet Take 1 tablet (20 mEq total) by mouth daily. 10/26/19  Yes Burns, [name], MD  clindamycin (CLINDAGEL) 1 % gel Apply topically 2 (two) times daily. Patient not taking: Reported on 02/07/2020 08/16/17   [name],  MD  triamcinolone ointment (KENALOG) 0.5 % Apply 1 application topically 2 (two) times daily. Patient not taking: Reported on 02/07/2020 01/07/18   [name], MD  lisinopril (PRINIVIL,ZESTRIL) 10 MG tablet Take 1 tablet (10 mg total) by mouth daily. 11/26/15 07/06/18  [name], MD    Allergies    Ace inhibitors and Penicillins  Review of Systems   Review of Systems Ten systems reviewed and are negative for acute change, except as noted in the HPI.   Physical Exam Updated Vital Signs BP 133/81   Pulse 68   Temp 98.3 F (36.8 C) (Oral)   Resp 15   SpO2 98%   Physical Exam Vitals and nursing  note reviewed.  Constitutional:      General: He is not in acute distress.    Appearance: He is well-developed. He is not diaphoretic.  HENT:     Head: Normocephalic and atraumatic.  Eyes:     General: No scleral icterus.    Conjunctiva/sclera: Conjunctivae normal.  Cardiovascular:     Rate and Rhythm: Normal rate and regular rhythm.     Heart sounds: Normal heart sounds.  Pulmonary:     Effort: Pulmonary effort is normal. No respiratory distress.     Breath sounds: Normal breath sounds.  Abdominal:     Palpations: Abdomen is soft.     Tenderness: There is no abdominal tenderness.  Musculoskeletal:     Cervical back: Normal range of motion and neck supple.  Skin:    General: Skin is warm and dry.  Neurological:     Mental Status: He is alert.  Psychiatric:        Behavior: Behavior normal.     ED Results / Procedures / Treatments   Labs (all labs ordered are listed, but only abnormal results are displayed) Labs Reviewed  BASIC METABOLIC PANEL - Abnormal; Notable for the following components:      Result Value   Potassium 3.1 (*)    Glucose, Bld 109 (*)    All other components within normal limits  TROPONIN I (HIGH SENSITIVITY) - Abnormal; Notable for the following components:   Troponin I (High Sensitivity) 2,436 (*)    All other components within normal limits  TROPONIN I (HIGH SENSITIVITY) - Abnormal; Notable for the following components:   Troponin I (High Sensitivity) 2,591 (*)    All other components within normal limits  SARS CORONAVIRUS 2 BY RT PCR (HOSPITAL ORDER, [facility])  CBC  HEPARIN LEVEL (UNFRACTIONATED)  CBC    EKG EKG Interpretation  Date/Time:  Wednesday February 07 2020 14:54:33 EDT Ventricular Rate:  71 PR Interval:  208 QRS Duration: 142 QT Interval:  447 QTC Calculation: 486 R Axis:   -70 Text Interpretation: Age not entered, assumed to be  69 years old for purpose of ECG interpretation Sinus rhythm Prolonged  PR interval Consider left atrial enlargement Nonspecific IVCD with LAD no significant change since earlier in the day Confirmed by Sherwood Gambler [id number]) on 02/07/2020 4:21:26 PM   Radiology DG Chest 2 View  Result Date: 02/07/2020 CLINICAL DATA:  Chest pain EXAM: CHEST - 2 VIEW COMPARISON:  February 06, 2020 FINDINGS: There is mild left base atelectasis. The lungs elsewhere are clear. Heart is borderline enlarged with pulmonary vascularity normal. No adenopathy. There is postoperative change in the lower cervical region. There is also postoperative change in the right shoulder. IMPRESSION: Mild left base atelectasis. Lungs elsewhere clear. Heart borderline enlarged. No  adenopathy. Electronically Signed   By: Lowella Grip III M.D.   On: 02/07/2020 13:28   DG Chest 2 View  Result Date: 02/07/2020 CLINICAL DATA:  Chest pain EXAM: CHEST - 2 VIEW COMPARISON:  2006 FINDINGS: No new consolidation or edema. Borderline cardiomegaly. Ascending thoracic aorta dilatation is not well evaluated. No pleural effusion or pneumothorax. No acute osseous abnormality. IMPRESSION: No acute process in the chest. Electronically Signed   By: Macy Mis M.D.   On: 02/07/2020 11:53   CT Angio Chest/Abd/Pel for Dissection W and/or Wo Contrast  Result Date: 02/07/2020 CLINICAL DATA:  Chest pain, back pain EXAM: CT ANGIOGRAPHY CHEST, ABDOMEN AND PELVIS TECHNIQUE: Non-contrast CT of the chest was initially obtained. Multidetector CT imaging through the chest, abdomen and pelvis was performed using the standard protocol during bolus administration of intravenous contrast. Multiplanar reconstructed images and MIPs were obtained and reviewed to evaluate the vascular anatomy. CONTRAST:  164mL OMNIPAQUE IOHEXOL 350 MG/ML SOLN COMPARISON:  None. FINDINGS: CTA CHEST FINDINGS Cardiovascular: Preferential opacification of the thoracic aorta. No thoracic aortic dissection. Ascending thoracic aorta measures 4.4 cm at the level of the  main pulmonary artery. Enlarged heart size. No pericardial effusion. Coronary artery atherosclerosis. Thoracic aortic atherosclerosis. Main pulmonary artery measures 3.6 cm in diameter. Mediastinum/Nodes: No enlarged mediastinal, hilar, or axillary lymph nodes. Thyroid gland, trachea, and esophagus demonstrate no significant findings. Lungs/Pleura: Mild left basilar atelectasis. No pleural effusion or pneumothorax. Musculoskeletal: No acute osseous abnormality. No aggressive osseous lesion. Degenerative disease with disc height loss and facet arthropathy throughout the thoracic spine. Review of the MIP images confirms the above findings. CTA ABDOMEN AND PELVIS FINDINGS VASCULAR Aorta: Normal caliber aorta without aneurysm, dissection, vasculitis or significant stenosis. Abdominal aortic atherosclerosis. 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 Celiac: Patent without evidence of aneurysm, dissection, vasculitis or significant stenosis. SMA: Patent without evidence of aneurysm, dissection, vasculitis or significant stenosis. Renals: Both renal arteries are patent without evidence of aneurysm, dissection, vasculitis, fibromuscular dysplasia or significant stenosis. IMA: Patent without evidence of aneurysm, dissection, vasculitis or significant stenosis. Inflow: Left common iliac artery measures 2.5 cm . Right common iliac artery measures 2.2 cm. Atherosclerotic plaque. Veins: No obvious venous abnormality within the limitations of this arterial phase study. Review of the MIP images confirms the above findings. NON-VASCULAR Hepatobiliary: No focal liver abnormality is seen. No gallstones, gallbladder wall thickening, or biliary dilatation. Pancreas: Unremarkable. No pancreatic ductal dilatation or surrounding inflammatory changes. Spleen: Normal in size without focal abnormality. Adrenals/Urinary Tract: No adrenal mass. No urolithiasis or obstructive uropathy. 4.7 cm hypodense, fluid attenuating right inferior hepatic  mass most consistent with a cyst. Normal bladder. Stomach/Bowel: Small hiatal hernia. Stomach is within normal limits. Appendix appears normal. No evidence of bowel wall thickening, distention, or inflammatory changes. Diverticulosis without evidence of diverticulitis. Lymphatic: No lymphadenopathy. Reproductive: Prostate is unremarkable. Other: Small fat containing umbilical hernia.  No ascites. Musculoskeletal: No acute osseous abnormality. No aggressive osseous lesion. Degenerative disease with disc height loss and facet arthropathy of the lumbar spine. Review of the MIP images confirms the above findings. IMPRESSION: 1. No evidence of aortic dissection. 2. Ascending thoracic aorta measures 4.4 cm at the level of the main pulmonary artery. Recommend annual imaging followup by CTA or MRA. This recommendation follows 2010 ACCF/AHA/AATS/ACR/ASA/SCA/SCAI/SIR/STS/SVM Guidelines for the Diagnosis and Management of Patients with Thoracic Aortic Disease. Circulation. 2010; 121: F751-W258. Aortic aneurysm NOS (ICD10-I71.9) 3. 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 Recommend follow-up every 5 years. This recommendation follows ACR consensus  guidelines: White Paper of the ACR Incidental Findings Committee II on Vascular Findings. J Am Coll Radiol 2013; 84:132-440. 4. Aortic Atherosclerosis (ICD10-I70.0). Electronically Signed   By: Kathreen Devoid   On: 02/07/2020 16:41    Procedures .Critical Care Performed by: Margarita Mail, PA-C Authorized by: Margarita Mail, PA-C   Critical care provider statement:    Critical care time (minutes):  50   Critical care time was exclusive of:  Separately billable procedures and treating other patients   Critical care was necessary to treat or prevent imminent or life-threatening deterioration of the following conditions:  Cardiac failure   Critical care was time spent personally by me on the following activities:  Discussions with consultants, evaluation of patient's  response to treatment, examination of patient, ordering and performing treatments and interventions, ordering and review of laboratory studies, ordering and review of radiographic studies, pulse oximetry, re-evaluation of patient's condition, obtaining history from patient or surrogate and review of old charts   (including critical care time)  Medications Ordered in ED Medications  heparin bolus via infusion 4,000 Units (has no administration in time range)  heparin ADULT infusion 100 units/mL (25000 units/269mL sodium chloride 0.45%) (has no administration in time range)  potassium chloride SA (KLOR-CON) CR tablet 40 mEq (40 mEq Oral Given 02/07/20 1645)  iohexol (OMNIPAQUE) 350 MG/ML injection 100 mL (100 mLs Intravenous Contrast Given 02/07/20 1602)  aspirin chewable tablet 324 mg (324 mg Oral Given 02/07/20 1733)    ED Course  I have reviewed the triage vital signs and the nursing notes.  Pertinent labs & imaging results that were available during my care of the patient were reviewed by me and considered in my medical decision making (see chart for details).    MDM Rules/Calculators/A&P                           NU:UVOZD pain VS: BP 133/81   Pulse 68   Temp 98.3 F (36.8 C) (Oral)   Resp 15   SpO2 98%   GU:YQIHKVQ is gathered by patient and EMR. Previous records obtained and reviewed. DDX:The patient's complaint of chest pain involves an extensive number of diagnostic and treatment options, and is a complaint that carries with it a high risk of complications, morbidity, and potential mortality. Given the large differential diagnosis, medical decision making is of high complexity. The emergent differential diagnosis of chest pain includes: Acute coronary syndrome, pericarditis, aortic dissection, pulmonary embolism, tension pneumothorax, pneumonia, and esophageal rupture.  Labs: I ordered reviewed and interpreted labs which include CBC which shows no abnormalities, BMP with mildly  low potassium.  Patient given 65 M EQ orally.  Covid test pending, troponin elevated at 2436, it is increased to 2591 over 2 hours. Imaging: I ordered and reviewed images which included 2 view chest x-ray, CT angiogram of the chest abdomen and pelvis. I independently visualized and interpreted all imaging. Significant findings include ascending aortic aneurysm not significantly increased and abdominal aortic ectasia of about 2.6 cm.  EKG: Normal sinus rhythm at a rate of 61 Consults: Consulted with Dr. Burt Knack of cardiology service MDM: Patient with elevated troponin, over a week of chest pain likely cardiac.  Will treat for ACS with heparin.  He will need admission. Patient disposition:admit  The patient appears reasonably stabilized for admission considering the current resources, flow, and capabilities available in the ED at this time, and I doubt any other [facility] requiring further screening and/or treatment  in the ED prior to admission.        Final Clinical Impression(s) / ED Diagnoses Final diagnoses:  NSTEMI (non-ST elevated myocardial infarction) [facility])  Aortic ectasia, abdominal (HCC)  Ascending aortic aneurysm ([place])  Hypokalemia    Rx / DC Orders ED Discharge Orders    None       Margarita Mail, PA-C 02/07/20 1803    Sherwood Gambler, MD 02/08/20 1714

## 2020-02-07 NOTE — Progress Notes (Signed)
Patient refuses to go to ER; he expresses understanding of the risk associated with these labs results and notes he wants to think about it.

## 2020-02-07 NOTE — Telephone Encounter (Signed)
I continued to call the pt multiple times on his cell phone yesterday.   Called cell & home - both had full mailboxes; was able to reach pt on home phone.  Pt confirmed that he got the VM I left yesterday & repeated back my request for him to go the hospital.  Pt states he asked to see EKG yesterday, but acknowledges that Mickel Baas told him he would require cardiology f/u; informed pt that his troponin was elevated which could indicate there was a possible heart attack, that further evaluation in the ED was necessary.  Pt denies chest pain & SOB at this time & states he will be fine. Educated pt that just b/c he isn't symptomatic at this time, further evaluation is needed to determine if cardiac cath needed.  Pt has financial concerns about a ED visit & cardiac cath & states he would rather not have to spend money on that & stay at home.   I spent approx 15 minutes on phone trying to convince pt that he should go to ED for evaluation for a heart attack.  Pt states he may or may not go & thanked me for taking the time to talk with him.

## 2020-02-07 NOTE — H&P (Addendum)
Cardiology Admission History and Physical:   Patient ID: Connor Martinez MRN: [id number]; DOB: March 09, 1951   Admission date: 02/07/2020  Primary Care Provider: [name], MD [facility] Cardiologist: [place] Electrophysiologist:  None   Chief Complaint:  Chest pain  Patient Profile:   Connor Martinez is a 69 y.o. male with pmh of BPV, sciatica, angioedema with ACEi, ascending aortic aneurysm, HLD, HTN, obesity, tobacco use who presents to the ED with chest pain.   History of Present Illness:   Connor Martinez has not been seen by [facility] in the past. No history of MI, stent, or CHF. He had an echo in 2019 showing preserved EF and G1DD, ascending aortic diameter 32mm, trivial MR, mod dilated LA, mildly dilated RA. He takes amlodipine and hydralazine for BP. Also on atorvastatin for cholesterol. Family history positive for MI in his mother at age 51. He has been a smoker his whole life. No alcohol or drug use. He has an Ascending aortic aneurysm for which he follows with Connor Martinez.   The patient was seen yesterday at his PCPs reporting chest pain. He said it started 7 days ago. It is in the left side of the chest and radiating into his upper back and between the shoulder blades. Pain was not worse with exertion. Denied sob, diaphoresis, N/V. He took 2 aspirin for the pain. The pain continued to wax and wane which is when he decided to go to his PCP's. Troponin level was drawn which cam back to over 2,9000. Office called the patient and told him to go to the ER for evaluation. Patient was reluctant at first but did go in.    In the ED BP 138/66, pulse 74, afebrile, RR 16, 96%. Labs showed potassium 3.1, glucose 109, creatinine 0.72, WBC 9.9, Hgb 13.6. HS troponin [phone]. EKG showed NSR with possible LVH and repol abnormalities, no ST elevation. Cardiology was called to admit.   Past Medical History:  Diagnosis Date  . Angioedema   . BPV (benign positional vertigo)   . Hemorrhoids    . Hyperlipidemia   . 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 BACK SURGERY    . SHOULDER SURGERY     Right     Medications Prior to Admission: Prior to Admission medications   Medication Sig Start Date End Date Taking? Authorizing Provider  amLODipine (NORVASC) 10 MG tablet Take 1 tablet (10 mg total) by mouth daily. 10/25/19  Yes Burns, [name], MD  atorvastatin (LIPITOR) 20 MG tablet Take 1 tablet (20 mg total) by mouth daily. 10/25/19  Yes Burns, [name], MD  diphenhydrAMINE (BENADRYL) 25 MG tablet Take 25 mg by mouth every 6 (six) hours as needed (swelling).   Yes [provider]  hydrALAZINE (APRESOLINE) 100 MG tablet Take 1 tablet (100 mg total) by mouth 3 (three) times daily. 10/25/19  Yes Burns, [name], MD  potassium chloride SA (KLOR-CON) 20 MEQ tablet Take 1 tablet (20 mEq total) by mouth daily. 10/26/19  Yes Burns, [name], MD  clindamycin (CLINDAGEL) 1 % gel Apply topically 2 (two) times daily. Patient not taking: Reported on 02/07/2020 08/16/17   [name], MD  triamcinolone ointment (KENALOG) 0.5 % Apply 1 application topically 2 (two) times daily. Patient not taking: Reported on 02/07/2020 01/07/18   [name], MD  lisinopril (PRINIVIL,ZESTRIL) 10  MG tablet Take 1 tablet (10 mg total) by mouth daily. 11/26/15 07/06/18  [name], MD     Allergies:    Allergies  Allergen Reactions  . Ace Inhibitors Swelling    angioedema  . Penicillins Rash    Social History:   Social History   Socioeconomic History  . Marital status: Divorced    Spouse name: Not on file  . Number of children: Not on file  . Years of education: Not on file  . Highest education level: Not on file  Occupational History  . Not on file  Tobacco Use  . Smoking status: Current Every Day Smoker  . Smokeless tobacco: Never Used  Substance and Sexual Activity  . Alcohol use: Yes     Comment: infrequent  . Drug use: Not Currently    Types: Marijuana    Comment: occasional  . Sexual activity: Not on file  Other Topics Concern  . Not on file  Social History Narrative  . Not on file   Social Determinants of Health   Financial Resource Strain:   . Difficulty of Paying Living Expenses:   Food Insecurity:   . Worried About Charity fundraiser in the Last Year:   . Arboriculturist in the Last Year:   Transportation Needs:   . Film/video editor (Medical):   Marland Kitchen Lack of Transportation (Non-Medical):   Physical Activity:   . Days of Exercise per Week:   . Minutes of Exercise per Session:   Stress:   . Feeling of Stress :   Social Connections:   . Frequency of Communication with Friends and Family:   . Frequency of Social Gatherings with Friends and Family:   . Attends Religious Services:   . Active Member of Clubs or Organizations:   . Attends Archivist Meetings:   Marland Kitchen Marital Status:   Intimate Partner Violence:   . Fear of Current or Ex-Partner:   . Emotionally Abused:   Marland Kitchen Physically Abused:   . Sexually Abused:     Family History:   The patient's family history includes Heart disease in his mother; Hepatitis C in his brother; Hypertension in his brother and mother; Stroke in his mother.    ROS:  Please see the history of present illness.  All other ROS reviewed and negative.     Physical Exam/Data:   Vitals:   02/07/20 1432 02/07/20 1611 02/07/20 1630 02/07/20 1651  BP: (!) 143/81 (!) 145/105 133/81   Pulse: 63 61 65 68  Resp: 16 15    Temp: 98.3 F (36.8 C)     TempSrc: Oral     SpO2: 96% 100% 91% 98%   No intake or output data in the 24 hours ending 02/07/20 1803 Last 3 Weights 02/06/2020 10/25/2019 04/05/2019  Weight (lbs) 211 lb 215 lb 12.8 oz 217 lb  Weight (kg) 95.709 kg 97.886 kg 98.431 kg     There is no height or weight on file to calculate BMI.  General:  Well nourished, well developed, in no acute distress HEENT:  normal Lymph: no adenopathy Neck: no JVD Endocrine:  No thryomegaly Vascular: No carotid bruits; FA pulses 2+ bilaterally without bruits  Cardiac:  normal S1, S2; RRR; no murmur  Lungs:  clear to auscultation bilaterally, no wheezing, rhonchi or rales  Abd: soft, nontender, no hepatomegaly  Ext: no edema Musculoskeletal:  No deformities, BUE and BLE strength normal and equal Skin: warm and dry  Neuro:  CNs 2-12 intact, no focal abnormalities noted Psych:  Normal affect    EKG:  The ECG that was done 02/07/20 was personally reviewed and demonstrates NSR, LAD, possible LVH, TWI aVL  Relevant CV Studies:  Echo 2019 Study Conclusions   - Left ventricle: The cavity size was normal. There was moderate  concentric hypertrophy. Systolic function was normal. The  estimated ejection fraction was in the range of 60% to 65%. Wall  motion was normal; there were no regional wall motion  abnormalities. Doppler parameters are consistent with abnormal  left ventricular relaxation (grade 1 diastolic dysfunction). The  E/e&' ratio is >15, suggesting elevated LV filling pressure.  - Aortic valve: Trileaflet. Sclerosis without stenosis. There was  no regurgitation.  - Aorta: Aortic root dimension: 40 mm (ED). Ascending aortic  diameter: 44 mm (S).  - Ascending aorta: The ascending aorta was moderately dilated.  - Mitral valve: Mildly thickened leaflets . There was trivial  regurgitation.  - Left atrium: Moderately dilated.  - Right atrium: The atrium was mildly dilated.  - Inferior vena cava: The vessel was normal in size. The  respirophasic diameter changes were in the normal range (>= 50%),  consistent with normal central venous pressure.   Impressions:   - Compared to a prior study in 2010, there are few changes. The  ascending aorta is dilated up to 4.4 cm. There is moderate LAE  and mild RAE.    Laboratory Data:  High Sensitivity Troponin:   Recent Labs   Lab 02/07/20 1249 02/07/20 1454  TROPONINIHS 2,436* 2,591*      Chemistry Recent Labs  Lab 02/06/20 1315 02/07/20 1249  NA 139 141  K 3.3* 3.1*  CL 104 106  CO2 26 24  GLUCOSE 103* 109*  BUN 11 9  CREATININE 0.74 0.72  CALCIUM 9.1 9.1  GFRNONAA  --  >60  GFRAA  --  >60  ANIONGAP  --  11    Recent Labs  Lab 02/06/20 1315  PROT 6.9  ALBUMIN 3.9  AST 36  ALT 16  ALKPHOS 71  BILITOT 0.6   Hematology Recent Labs  Lab 02/07/20 1249  WBC 9.9  RBC 4.79  HGB 13.6  HCT 41.3  MCV 86.2  MCH 28.4  MCHC 32.9  RDW 14.7  PLT 221   BNPNo results for input(s): BNP, PROBNP in the last 168 hours.  DDimer  Recent Labs  Lab 02/06/20 1315  DDIMER 0.45     Radiology/Studies:  DG Chest 2 View  Result Date: 02/07/2020 CLINICAL DATA:  Chest pain EXAM: CHEST - 2 VIEW COMPARISON:  February 06, 2020 FINDINGS: There is mild left base atelectasis. The lungs elsewhere are clear. Heart is borderline enlarged with pulmonary vascularity normal. No adenopathy. There is postoperative change in the lower cervical region. There is also postoperative change in the right shoulder. IMPRESSION: Mild left base atelectasis. Lungs elsewhere clear. Heart borderline enlarged. No adenopathy. Electronically Signed   By: Lowella Grip III M.D.   On: 02/07/2020 13:28   CT Angio Chest/Abd/Pel for Dissection W and/or Wo Contrast  Result Date: 02/07/2020 CLINICAL DATA:  Chest pain, back pain EXAM: CT ANGIOGRAPHY CHEST, ABDOMEN AND PELVIS TECHNIQUE: Non-contrast CT of the chest was initially obtained. Multidetector CT imaging through the chest, abdomen and pelvis was performed using the standard protocol during bolus administration of intravenous contrast. Multiplanar reconstructed images and MIPs were obtained and reviewed to evaluate the vascular anatomy. CONTRAST:  134mL OMNIPAQUE IOHEXOL 350 MG/ML SOLN COMPARISON:  None. FINDINGS: CTA CHEST FINDINGS Cardiovascular: Preferential opacification of the  thoracic aorta. No thoracic aortic dissection. Ascending thoracic aorta measures 4.4 cm at the level of the main pulmonary artery. Enlarged heart size. No pericardial effusion. Coronary artery atherosclerosis. Thoracic aortic atherosclerosis. Main pulmonary artery measures 3.6 cm in diameter. Mediastinum/Nodes: No enlarged mediastinal, hilar, or axillary lymph nodes. Thyroid gland, trachea, and esophagus demonstrate no significant findings. Lungs/Pleura: Mild left basilar atelectasis. No pleural effusion or pneumothorax. Musculoskeletal: No acute osseous abnormality. No aggressive osseous lesion. Degenerative disease with disc height loss and facet arthropathy throughout the thoracic spine. Review of the MIP images confirms the above findings. CTA ABDOMEN AND PELVIS FINDINGS VASCULAR Aorta: Normal caliber aorta without aneurysm, dissection, vasculitis or significant stenosis. Abdominal aortic atherosclerosis. 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 Celiac: Patent without evidence of aneurysm, dissection, vasculitis or significant stenosis. SMA: Patent without evidence of aneurysm, dissection, vasculitis or significant stenosis. Renals: Both renal arteries are patent without evidence of aneurysm, dissection, vasculitis, fibromuscular dysplasia or significant stenosis. IMA: Patent without evidence of aneurysm, dissection, vasculitis or significant stenosis. Inflow: Left common iliac artery measures 2.5 cm . Right common iliac artery measures 2.2 cm. Atherosclerotic plaque. Veins: No obvious venous abnormality within the limitations of this arterial phase study. Review of the MIP images confirms the above findings. NON-VASCULAR Hepatobiliary: No focal liver abnormality is seen. No gallstones, gallbladder wall thickening, or biliary dilatation. Pancreas: Unremarkable. No pancreatic ductal dilatation or surrounding inflammatory changes. Spleen: Normal in size without focal abnormality. Adrenals/Urinary Tract: No  adrenal mass. No urolithiasis or obstructive uropathy. 4.7 cm hypodense, fluid attenuating right inferior hepatic mass most consistent with a cyst. Normal bladder. Stomach/Bowel: Small hiatal hernia. Stomach is within normal limits. Appendix appears normal. No evidence of bowel wall thickening, distention, or inflammatory changes. Diverticulosis without evidence of diverticulitis. Lymphatic: No lymphadenopathy. Reproductive: Prostate is unremarkable. Other: Small fat containing umbilical hernia.  No ascites. Musculoskeletal: No acute osseous abnormality. No aggressive osseous lesion. Degenerative disease with disc height loss and facet arthropathy of the lumbar spine. Review of the MIP images confirms the above findings. IMPRESSION: 1. No evidence of aortic dissection. 2. Ascending thoracic aorta measures 4.4 cm at the level of the main pulmonary artery. Recommend annual imaging followup by CTA or MRA. This recommendation follows 2010 ACCF/AHA/AATS/ACR/ASA/SCA/SCAI/SIR/STS/SVM Guidelines for the Diagnosis and Management of Patients with Thoracic Aortic Disease. Circulation. 2010; 121: W258-N277. Aortic aneurysm NOS (ICD10-I71.9) 3. 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 Recommend follow-up every 5 years. This recommendation follows ACR consensus guidelines: White Paper of the ACR Incidental Findings Committee II on Vascular Findings. J Am Coll Radiol 2013; 82:423-536. 4. Aortic Atherosclerosis (ICD10-I70.0). Electronically Signed   By: Kathreen Devoid   On: 02/07/2020 16:41    TIMI Risk Score for Unstable Angina or Non-ST Elevation MI:   The patient's TIMI risk score is 5, which indicates a 26% risk of all cause mortality, new or recurrent myocardial infarction or need for urgent revascularization in the next 14 days.    Assessment and Plan:   NSTEMI - presents with intermittent chest pain x 1 week and initial troponin than 2,9000 at his PCPs office and was sent to the ER  - Follow-up troponin  2,436>2,591. EKG with NSR with IVCD, possible LVH with repolarization abnormalities, no STE or St depression  - patient is chest pain free at this time - IV heparin - start Aspirin 81 mg daily - continue atorvastatin>>will increase - start BB - check echo - admit and  plan cath. Kidney function stable Risks and benefits of cardiac catheterization have been discussed with the patient.  These include bleeding, infection, kidney damage, stroke, heart attack, death.  The patient understands these risks and is willing to proceed.  HTN - history of uncontrolled diabetes. continue home amlodipine and hold hydralazine - add Lopressor 25 mg BID - so far pressure reasonable. Might need to re-start hydralazine if too high  HLD - lipid profile: LDL 117, HDL 50, TG 77, Total 184 - Increase atorvastatin to 80 mg  Hypokalemia - appears to be a chronic issue - given K+ in the ED. Restart home potassium - daily BMET  Tobacco use  - counseled on cessation  Ascending aortic Aneurysm - followed by Connor Martinez   Severity of Illness: The appropriate patient status for this patient is OBSERVATION. Observation status is judged to be reasonable and necessary in order to provide the required intensity of service to ensure the patient's safety. The patient's presenting symptoms, physical exam findings, and initial radiographic and laboratory data in the context of their medical condition is felt to place them at decreased risk for further clinical deterioration. Furthermore, it is anticipated that the patient will be medically stable for discharge from the hospital within 2 midnights of admission. The following factors support the patient status of observation.   " The patient's presenting symptoms include chest pain. " The physical exam findings include chest pain. " The initial radiographic and laboratory data are chest pain.     For questions or updates, please contact [place] Please consult  [email or web address] for contact info under     Signed, Cadence Ninfa Meeker, PA-C  02/07/2020 6:03 PM   Patient seen, examined. Available data reviewed. Agree with findings, assessment, and plan as outlined by Cadence Kathlen Mody, PA-C. On my exam: Vitals:   02/07/20 1700 02/07/20 1800  BP: (!) 148/77 112/90  Pulse: 71 63  Resp:  18  Temp:    SpO2: 95% 94%   Pt is alert and oriented, NAD HEENT: normal Neck: JVP - normal Lungs: CTA bilaterally CV: RRR without murmur or gallop Abd: soft, NT, Positive BS, no hepatomegaly Ext: no C/C/E, distal pulses intact and equal Skin: warm/dry no rash  The patient presents with 4 days of waxing and waning chest discomfort.  His symptoms have now resolved and he denies any chest pain over the last 24 hours.  However, his cardiac markers are significantly elevated.  His outpatient troponin was in a similar range to our hospital troponin numbers today.  He has worsening of a left intraventricular conduction delay on EKG.  He has no signs or symptoms of congestive heart failure.  The patient has multiple cardiovascular risk factors as outlined above with a strong family history of premature CAD (mother had CABG at 80), hypertension, and ongoing tobacco use.  With positive biomarkers and high probability of obstructive CAD, I have recommended definitive evaluation with cardiac catheterization and possible PCI.  The patient has been started on IV heparin.  A beta-blocker is started.  Atorvastatin is increased to 80 mg for high intensity dosing. I have reviewed the risks, indications, and alternatives to cardiac catheterization, possible angioplasty, and stenting with the patient. Risks include but are not limited to bleeding, infection, vascular injury, stroke, myocardial infection, arrhythmia, kidney injury, radiation-related injury in the case of prolonged fluoroscopy use, emergency cardiac surgery, and death. The patient understands the risks of serious complication is 1-2 in  3419 with diagnostic cardiac cath and 1-2%  or less with angioplasty/stenting.  Further plans/disposition pending cardiac catheterization results.  Sherren Mocha, M.D. 02/07/2020 6:59 PM

## 2020-02-07 NOTE — ED Triage Notes (Signed)
Pt reports central and L sided chest pain x 1 week. Denies n/v and shob. Troponin level drawn yesterday was over 2900.

## 2020-02-08 ENCOUNTER — Observation Stay (HOSPITAL_COMMUNITY): Payer: Medicare HMO

## 2020-02-08 ENCOUNTER — Inpatient Hospital Stay (HOSPITAL_COMMUNITY)
Admission: EM | Disposition: A | Discharge status: Home / Self Care | Payer: Medicare HMO | Source: Home / Self Care | Attending: Cardiovascular Disease

## 2020-02-08 DIAGNOSIS — I712 Thoracic aortic aneurysm, without rupture: Secondary | ICD-10-CM | POA: Diagnosis present

## 2020-02-08 DIAGNOSIS — R079 Chest pain, unspecified: Secondary | ICD-10-CM

## 2020-02-08 DIAGNOSIS — Z823 Family history of stroke: Secondary | ICD-10-CM | POA: Diagnosis not present

## 2020-02-08 DIAGNOSIS — E669 Obesity, unspecified: Secondary | ICD-10-CM | POA: Diagnosis present

## 2020-02-08 DIAGNOSIS — R001 Bradycardia, unspecified: Secondary | ICD-10-CM | POA: Diagnosis present

## 2020-02-08 DIAGNOSIS — I714 Abdominal aortic aneurysm, without rupture: Secondary | ICD-10-CM | POA: Diagnosis not present

## 2020-02-08 DIAGNOSIS — I25110 Atherosclerotic heart disease of native coronary artery with unstable angina pectoris: Secondary | ICD-10-CM | POA: Diagnosis present

## 2020-02-08 DIAGNOSIS — I471 Supraventricular tachycardia: Secondary | ICD-10-CM | POA: Diagnosis present

## 2020-02-08 DIAGNOSIS — I5021 Acute systolic (congestive) heart failure: Secondary | ICD-10-CM | POA: Diagnosis present

## 2020-02-08 DIAGNOSIS — I255 Ischemic cardiomyopathy: Secondary | ICD-10-CM | POA: Diagnosis present

## 2020-02-08 DIAGNOSIS — F17200 Nicotine dependence, unspecified, uncomplicated: Secondary | ICD-10-CM | POA: Diagnosis present

## 2020-02-08 DIAGNOSIS — Z20822 Contact with and (suspected) exposure to covid-19: Secondary | ICD-10-CM | POA: Diagnosis present

## 2020-02-08 DIAGNOSIS — I110 Hypertensive heart disease with heart failure: Secondary | ICD-10-CM | POA: Diagnosis present

## 2020-02-08 DIAGNOSIS — I214 Non-ST elevation (NSTEMI) myocardial infarction: Secondary | ICD-10-CM | POA: Diagnosis present

## 2020-02-08 DIAGNOSIS — I2510 Atherosclerotic heart disease of native coronary artery without angina pectoris: Secondary | ICD-10-CM | POA: Diagnosis not present

## 2020-02-08 DIAGNOSIS — E785 Hyperlipidemia, unspecified: Secondary | ICD-10-CM | POA: Diagnosis present

## 2020-02-08 DIAGNOSIS — E876 Hypokalemia: Secondary | ICD-10-CM | POA: Diagnosis present

## 2020-02-08 DIAGNOSIS — E119 Type 2 diabetes mellitus without complications: Secondary | ICD-10-CM | POA: Diagnosis present

## 2020-02-08 DIAGNOSIS — Z8249 Family history of ischemic heart disease and other diseases of the circulatory system: Secondary | ICD-10-CM | POA: Diagnosis not present

## 2020-02-08 HISTORY — PX: CORONARY STENT INTERVENTION: CATH118234

## 2020-02-08 HISTORY — PX: LEFT HEART CATH AND CORONARY ANGIOGRAPHY: CATH118249

## 2020-02-08 LAB — POCT ACTIVATED CLOTTING TIME
Activated Clotting Time: 252 seconds
Activated Clotting Time: 351 seconds

## 2020-02-08 LAB — BASIC METABOLIC PANEL
Anion gap: 11 (ref 5–15)
BUN: 7 mg/dL — ABNORMAL LOW (ref 8–23)
CO2: 25 mmol/L (ref 22–32)
Calcium: 8.8 mg/dL — ABNORMAL LOW (ref 8.9–10.3)
Chloride: 103 mmol/L (ref 98–111)
Creatinine, Ser: 0.59 mg/dL — ABNORMAL LOW (ref 0.61–1.24)
GFR calc Af Amer: >60 mL/min (ref >60)
GFR calc non Af Amer: >60 mL/min (ref >60)
Glucose, Bld: 112 mg/dL — ABNORMAL HIGH (ref 70–99)
Potassium: 3.3 mmol/L — ABNORMAL LOW (ref 3.5–5.1)
Sodium: 139 mmol/L (ref 135–145)

## 2020-02-08 LAB — ECHOCARDIOGRAM COMPLETE
Area-P 1/2: 4.68 cm2
Calc EF: 48.6 %
S' Lateral: 4.9 cm
Single Plane A2C EF: 42.6 %
Single Plane A4C EF: 52.4 %

## 2020-02-08 LAB — CBC
HCT: 40.6 % (ref 39.0–52.0)
Hemoglobin: 13.1 g/dL (ref 13.0–17.0)
MCH: 28.4 pg (ref 26.0–34.0)
MCHC: 32.3 g/dL (ref 30.0–36.0)
MCV: 88.1 fL (ref 80.0–100.0)
Platelets: 197 10*3/uL (ref 150–400)
RBC: 4.61 MIL/uL (ref 4.22–5.81)
RDW: 14.8 % (ref 11.5–15.5)
WBC: 7.8 10*3/uL (ref 4.0–10.5)
nRBC: 0 % (ref 0.0–0.2)

## 2020-02-08 LAB — HEPARIN LEVEL (UNFRACTIONATED): Heparin Unfractionated: <0.1 IU/mL — ABNORMAL LOW (ref 0.30–0.70)

## 2020-02-08 SURGERY — LEFT HEART CATH AND CORONARY ANGIOGRAPHY
Anesthesia: LOCAL

## 2020-02-08 MED ORDER — SODIUM CHLORIDE 0.9% FLUSH: 3.0000 mL | INTRAVENOUS | Status: DC | PRN

## 2020-02-08 MED ORDER — HEPARIN SODIUM (PORCINE) 1000 UNIT/ML IJ SOLN
INTRAMUSCULAR | Status: AC
  Filled 2020-02-08: qty 1

## 2020-02-08 MED ORDER — METOPROLOL TARTRATE 12.5 MG HALF TABLET
12.5000 mg | ORAL_TABLET | Freq: Two times a day (BID) | ORAL | Status: DC
  Administered 2020-02-08 – 2020-02-09 (×2): 12.5 mg via ORAL
  Filled 2020-02-08 (×2): qty 1

## 2020-02-08 MED ORDER — TICAGRELOR 90 MG PO TABS
90.0000 mg | ORAL_TABLET | Freq: Two times a day (BID) | ORAL | Status: DC
  Administered 2020-02-08 – 2020-02-10 (×4): 90 mg via ORAL
  Filled 2020-02-08 (×4): qty 1

## 2020-02-08 MED ORDER — NITROGLYCERIN 1 MG/10 ML FOR IR/CATH LAB
INTRA_ARTERIAL | Status: DC | PRN
  Administered 2020-02-08 (×2): 200 ug via INTRACORONARY

## 2020-02-08 MED ORDER — HEPARIN (PORCINE) IN NACL 1000-0.9 UT/500ML-% IV SOLN
INTRAVENOUS | Status: AC
  Filled 2020-02-08: qty 1000

## 2020-02-08 MED ORDER — FUROSEMIDE 10 MG/ML IJ SOLN
INTRAMUSCULAR | Status: DC | PRN
  Administered 2020-02-08: 40 mg via INTRAVENOUS

## 2020-02-08 MED ORDER — TIROFIBAN HCL IN NACL 5-0.9 MG/100ML-% IV SOLN
INTRAVENOUS | Status: AC
  Filled 2020-02-08: qty 100

## 2020-02-08 MED ORDER — IOHEXOL 350 MG/ML SOLN
INTRAVENOUS | Status: DC | PRN
  Administered 2020-02-08: 170 mL

## 2020-02-08 MED ORDER — SODIUM CHLORIDE 0.9 % IV SOLN: 250.0000 mL | INTRAVENOUS | Status: DC | PRN

## 2020-02-08 MED ORDER — VERAPAMIL HCL 2.5 MG/ML IV SOLN
INTRA_ARTERIAL | Status: DC | PRN
  Administered 2020-02-08 (×3): 5 mL via INTRA_ARTERIAL

## 2020-02-08 MED ORDER — VERAPAMIL HCL 2.5 MG/ML IV SOLN
INTRAVENOUS | Status: AC
  Filled 2020-02-08: qty 2

## 2020-02-08 MED ORDER — MORPHINE SULFATE (PF) 2 MG/ML IV SOLN: 2.0000 mg | INTRAVENOUS | Status: DC | PRN

## 2020-02-08 MED ORDER — HEPARIN BOLUS VIA INFUSION
2000.0000 [IU] | Freq: Once | INTRAVENOUS | Status: AC
  Administered 2020-02-08: 2000 [IU] via INTRAVENOUS
  Filled 2020-02-08: qty 2000

## 2020-02-08 MED ORDER — POTASSIUM CHLORIDE CRYS ER 20 MEQ PO TBCR: 40.0000 meq | EXTENDED_RELEASE_TABLET | Freq: Every day | ORAL | Status: DC

## 2020-02-08 MED ORDER — SODIUM CHLORIDE 0.9 % WEIGHT BASED INFUSION
3.0000 mL/kg/h | INTRAVENOUS | Status: DC
  Administered 2020-02-08: 3 mL/kg/h via INTRAVENOUS

## 2020-02-08 MED ORDER — ASPIRIN 81 MG PO CHEW
81.0000 mg | CHEWABLE_TABLET | ORAL | Status: AC
  Administered 2020-02-08: 81 mg via ORAL
  Filled 2020-02-08: qty 1

## 2020-02-08 MED ORDER — FENTANYL CITRATE (PF) 100 MCG/2ML IJ SOLN
INTRAMUSCULAR | Status: DC | PRN
Start: 2020-02-08 — End: 2020-02-08
  Administered 2020-02-08 (×2): 25 ug via INTRAVENOUS

## 2020-02-08 MED ORDER — FAMOTIDINE 20 MG PO TABS
20.0000 mg | ORAL_TABLET | Freq: Once | ORAL | Status: AC
  Administered 2020-02-08: 20 mg via ORAL
  Filled 2020-02-08: qty 1

## 2020-02-08 MED ORDER — SODIUM CHLORIDE 0.9% FLUSH
3.0000 mL | Freq: Two times a day (BID) | INTRAVENOUS | Status: DC
  Administered 2020-02-08 – 2020-02-09 (×3): 3 mL via INTRAVENOUS

## 2020-02-08 MED ORDER — NITROGLYCERIN 1 MG/10 ML FOR IR/CATH LAB
INTRA_ARTERIAL | Status: AC
  Filled 2020-02-08: qty 10

## 2020-02-08 MED ORDER — ATORVASTATIN CALCIUM 80 MG PO TABS: 80.0000 mg | ORAL_TABLET | Freq: Every day | ORAL | Status: DC

## 2020-02-08 MED ORDER — MIDAZOLAM HCL 2 MG/2ML IJ SOLN
INTRAMUSCULAR | Status: DC | PRN
  Administered 2020-02-08 (×2): 1 mg via INTRAVENOUS

## 2020-02-08 MED ORDER — TICAGRELOR 90 MG PO TABS
ORAL_TABLET | ORAL | Status: AC
  Filled 2020-02-08: qty 1

## 2020-02-08 MED ORDER — SODIUM CHLORIDE 0.9 % IV SOLN: INTRAVENOUS | Status: AC

## 2020-02-08 MED ORDER — MIDAZOLAM HCL 2 MG/2ML IJ SOLN
INTRAMUSCULAR | Status: AC
  Filled 2020-02-08: qty 2

## 2020-02-08 MED ORDER — ONDANSETRON HCL 4 MG/2ML IJ SOLN: 4.0000 mg | Freq: Four times a day (QID) | INTRAMUSCULAR | Status: DC | PRN

## 2020-02-08 MED ORDER — HEPARIN (PORCINE) IN NACL 1000-0.9 UT/500ML-% IV SOLN
INTRAVENOUS | Status: DC | PRN
  Administered 2020-02-08 (×2): 500 mL

## 2020-02-08 MED ORDER — TICAGRELOR 90 MG PO TABS
ORAL_TABLET | ORAL | Status: DC | PRN
  Administered 2020-02-08: 180 mg via ORAL

## 2020-02-08 MED ORDER — SODIUM CHLORIDE 0.9 % WEIGHT BASED INFUSION: 1.0000 mL/kg/h | INTRAVENOUS | Status: DC

## 2020-02-08 MED ORDER — FENTANYL CITRATE (PF) 100 MCG/2ML IJ SOLN
INTRAMUSCULAR | Status: AC
Start: 2020-02-08 — End: ?
  Filled 2020-02-08: qty 2

## 2020-02-08 MED ORDER — LIDOCAINE HCL (PF) 1 % IJ SOLN
INTRAMUSCULAR | Status: DC | PRN
  Administered 2020-02-08: 2 mL

## 2020-02-08 MED ORDER — ACETAMINOPHEN 325 MG PO TABS: 650.0000 mg | ORAL_TABLET | ORAL | Status: DC | PRN

## 2020-02-08 MED ORDER — LIDOCAINE HCL (PF) 1 % IJ SOLN
INTRAMUSCULAR | Status: AC
  Filled 2020-02-08: qty 30

## 2020-02-08 MED ORDER — LABETALOL HCL 5 MG/ML IV SOLN: 10.0000 mg | INTRAVENOUS | Status: AC | PRN

## 2020-02-08 MED ORDER — TIROFIBAN (AGGRASTAT) BOLUS VIA INFUSION
INTRAVENOUS | Status: DC | PRN
  Administered 2020-02-08: 2392.5 ug via INTRAVENOUS

## 2020-02-08 MED ORDER — HYDRALAZINE HCL 20 MG/ML IJ SOLN: 10.0000 mg | INTRAMUSCULAR | Status: AC | PRN

## 2020-02-08 MED ORDER — FUROSEMIDE 10 MG/ML IJ SOLN
INTRAMUSCULAR | Status: AC
  Filled 2020-02-08: qty 4

## 2020-02-08 MED ORDER — ASPIRIN 81 MG PO CHEW
81.0000 mg | CHEWABLE_TABLET | Freq: Every day | ORAL | Status: DC
  Administered 2020-02-09 – 2020-02-10 (×2): 81 mg via ORAL
  Filled 2020-02-08 (×2): qty 1

## 2020-02-08 MED ORDER — HEPARIN SODIUM (PORCINE) 1000 UNIT/ML IJ SOLN
INTRAMUSCULAR | Status: DC | PRN
  Administered 2020-02-08: 5000 [IU] via INTRAVENOUS
  Administered 2020-02-08: 6000 [IU] via INTRAVENOUS
  Administered 2020-02-08: 3000 [IU] via INTRAVENOUS

## 2020-02-08 MED ORDER — DIPHENHYDRAMINE HCL 25 MG PO CAPS
25.0000 mg | ORAL_CAPSULE | Freq: Once | ORAL | Status: AC
  Administered 2020-02-08: 25 mg via ORAL

## 2020-02-08 SURGICAL SUPPLY — 25 items
BALLN SAPPHIRE 2.0X12 (BALLOONS) ×2
BALLN SAPPHIRE ~~LOC~~ 2.5X12 (BALLOONS) ×2 IMPLANT
BALLOON SAPPHIRE 2.0X12 (BALLOONS) ×1 IMPLANT
CATH INFINITI 5FR ANG PIGTAIL (CATHETERS) ×2 IMPLANT
CATH OPTITORQUE TIG 4.0 5F (CATHETERS) ×2 IMPLANT
CATH VISTA GUIDE 6FR JR4 (CATHETERS) ×2 IMPLANT
CATH VISTA GUIDE 6FR XB3.5 (CATHETERS) ×2 IMPLANT
DEVICE RAD COMP TR BAND LRG (VASCULAR PRODUCTS) ×2 IMPLANT
GLIDESHEATH SLEND A-KIT 6F 22G (SHEATH) ×2 IMPLANT
GUIDEWIRE INQWIRE 1.5J.035X260 (WIRE) ×1 IMPLANT
INQWIRE 1.5J .035X260CM (WIRE) ×2
KIT ENCORE 26 ADVANTAGE (KITS) ×2 IMPLANT
KIT HEART LEFT (KITS) ×2 IMPLANT
PACK CARDIAC CATHETERIZATION (CUSTOM PROCEDURE TRAY) ×2 IMPLANT
STENT SYNERGY XD 2.25X12 (Permanent Stent) ×1 IMPLANT
STENT SYNERGY XD 2.25X16 (Permanent Stent) ×1 IMPLANT
STENT SYNERGY XD 2.25X24 (Permanent Stent) ×1 IMPLANT
SYNERGY XD 2.25X12 (Permanent Stent) ×2 IMPLANT
SYNERGY XD 2.25X16 (Permanent Stent) ×2 IMPLANT
SYNERGY XD 2.25X24 (Permanent Stent) ×2 IMPLANT
SYR MEDRAD MARK 7 150ML (SYRINGE) ×2 IMPLANT
TRANSDUCER W/STOPCOCK (MISCELLANEOUS) ×2 IMPLANT
TUBING CIL FLEX 10 FLL-RA (TUBING) ×2 IMPLANT
WIRE ASAHI PROWATER 180CM (WIRE) ×2 IMPLANT
WIRE HI TORQ VERSACORE-J 145CM (WIRE) ×2 IMPLANT

## 2020-02-08 NOTE — ED Notes (Signed)
Ordered [place] °

## 2020-02-08 NOTE — ED Notes (Signed)
No breakfast ordered--NPO--Daniele Dillow

## 2020-02-08 NOTE — Progress Notes (Signed)
[place] for heparin Indication: chest pain/ACS  Allergies  Allergen Reactions  . Ace Inhibitors Swelling    angioedema  . Penicillins Rash    Patient Measurements:   Heparin Dosing Weight: 83.5kg  Vital Signs: BP: 122/76 (08/19 0158) Pulse Rate: 53 (08/19 0158)  Labs: Recent Labs    02/06/20 1315 02/07/20 1249 02/07/20 1454 02/08/20 0444  HGB  --  13.6  --  13.1  HCT  --  41.3  --  40.6  PLT  --  221  --  197  HEPARINUNFRC  --   --   --  <0.10*  CREATININE 0.74 0.72  --   --   TROPONINIHS  --  2,436* 2,591*  --     Estimated Creatinine Clearance: 96.2 mL/min (by C-G formula based on SCr of 0.72 mg/dL).   Assessment: 69 y.o. male with chest pain for heparin   Goal of Therapy:  Heparin level 0.3-0.7 units/ml Monitor platelets by anticoagulation protocol: Yes   Plan:  Heparin 2000 units IV bolus, then increase heparin 1400 units/hr Follow up after cath today   Caryl Pina 02/08/2020,5:50 AM

## 2020-02-08 NOTE — ED Notes (Signed)
Hospital bed ordered for pt comfort while holding in ED

## 2020-02-08 NOTE — Progress Notes (Addendum)
Progress Note  Patient Name: Connor Martinez Date of Encounter: 02/08/2020  Primary Cardiologist: Sherren Mocha, MD  Subjective   Denies CP or SOB. Feeling well.   Inpatient Medications    Scheduled Meds: . amLODipine  10 mg Oral Daily  . aspirin  324 mg Oral NOW   Or  . aspirin  300 mg Rectal NOW  . aspirin EC  81 mg Oral Daily  . atorvastatin  80 mg Oral Daily  . metoprolol tartrate  25 mg Oral BID  . potassium chloride SA  20 mEq Oral Daily  . sodium chloride flush  3 mL Intravenous Q12H  . sodium chloride flush  3 mL Intravenous Q12H   Continuous Infusions: . sodium chloride    . heparin 1,400 Units/hr (02/08/20 0731)   PRN Meds: sodium chloride, acetaminophen, ALPRAZolam, diphenhydrAMINE, nitroGLYCERIN, ondansetron (ZOFRAN) IV, sodium chloride flush, zolpidem   Vital Signs    Vitals:   02/07/20 2220 02/07/20 2300 02/08/20 0146 02/08/20 0158  BP: 130/69 132/77 126/80 122/76  Pulse: (!) 56 (!) 58 (!) 52 (!) 53  Resp: 18 16 19  (!) 24  Temp:      TempSrc:      SpO2: 96% 96% 94% 92%    Intake/Output Summary (Last 24 hours) at 02/08/2020 0807 Last data filed at 02/08/2020 0730 Gross per 24 hour  Intake 183.84 ml  Output --  Net 183.84 ml   Last 3 Weights 02/06/2020 10/25/2019 04/05/2019  Weight (lbs) 211 lb 215 lb 12.8 oz 217 lb  Weight (kg) 95.709 kg 97.886 kg 98.431 kg     Telemetry    NSR, brief run of tachycardia overnight at 22h, occasional PACs/PVCs- Personally Reviewed  Physical Exam   GEN: No acute distress.  HEENT: Normocephalic, atraumatic, sclera non-icteric. Neck: No JVD or bruits. Cardiac: RRR no murmurs, rubs, or gallops.  Radials/DP/PT 1+ and equal bilaterally.  Respiratory: Clear to auscultation bilaterally. Breathing is unlabored. GI: Soft, nontender, non-distended, BS +x 4. MS: no deformity. Extremities: No clubbing or cyanosis. No edema. Distal pedal pulses are 2+ and equal bilaterally. Neuro:  AAOx3. Follows commands. Psych:   Responds to questions appropriately with a normal affect.  Labs    High Sensitivity Troponin:   Recent Labs  Lab 02/07/20 1249 02/07/20 1454  TROPONINIHS 2,436* 2,591*      Cardiac EnzymesNo results for input(s): TROPONINI in the last 168 hours. No results for input(s): TROPIPOC in the last 168 hours.   Chemistry Recent Labs  Lab 02/06/20 1315 02/07/20 1249  NA 139 141  K 3.3* 3.1*  CL 104 106  CO2 26 24  GLUCOSE 103* 109*  BUN 11 9  CREATININE 0.74 0.72  CALCIUM 9.1 9.1  PROT 6.9  --   ALBUMIN 3.9  --   AST 36  --   ALT 16  --   ALKPHOS 71  --   BILITOT 0.6  --   GFRNONAA  --  >60  GFRAA  --  >60  ANIONGAP  --  11     Hematology Recent Labs  Lab 02/07/20 1249 02/08/20 0444  WBC 9.9 7.8  RBC 4.79 4.61  HGB 13.6 13.1  HCT 41.3 40.6  MCV 86.2 88.1  MCH 28.4 28.4  MCHC 32.9 32.3  RDW 14.7 14.8  PLT 221 197    BNPNo results for input(s): BNP, PROBNP in the last 168 hours.   DDimer  Recent Labs  Lab 02/06/20 1315  DDIMER 0.45  Radiology    DG Chest 2 View  Result Date: 02/07/2020 CLINICAL DATA:  Chest pain EXAM: CHEST - 2 VIEW COMPARISON:  February 06, 2020 FINDINGS: There is mild left base atelectasis. The lungs elsewhere are clear. Heart is borderline enlarged with pulmonary vascularity normal. No adenopathy. There is postoperative change in the lower cervical region. There is also postoperative change in the right shoulder. IMPRESSION: Mild left base atelectasis. Lungs elsewhere clear. Heart borderline enlarged. No adenopathy. Electronically Signed   By: Lowella Grip III M.D.   On: 02/07/2020 13:28   DG Chest 2 View  Result Date: 02/07/2020 CLINICAL DATA:  Chest pain EXAM: CHEST - 2 VIEW COMPARISON:  2006 FINDINGS: No new consolidation or edema. Borderline cardiomegaly. Ascending thoracic aorta dilatation is not well evaluated. No pleural effusion or pneumothorax. No acute osseous abnormality. IMPRESSION: No acute process in the chest.  Electronically Signed   By: Macy Mis M.D.   On: 02/07/2020 11:53   CT Angio Chest/Abd/Pel for Dissection W and/or Wo Contrast  Result Date: 02/07/2020 CLINICAL DATA:  Chest pain, back pain EXAM: CT ANGIOGRAPHY CHEST, ABDOMEN AND PELVIS TECHNIQUE: Non-contrast CT of the chest was initially obtained. Multidetector CT imaging through the chest, abdomen and pelvis was performed using the standard protocol during bolus administration of intravenous contrast. Multiplanar reconstructed images and MIPs were obtained and reviewed to evaluate the vascular anatomy. CONTRAST:  110mL OMNIPAQUE IOHEXOL 350 MG/ML SOLN COMPARISON:  None. FINDINGS: CTA CHEST FINDINGS Cardiovascular: Preferential opacification of the thoracic aorta. No thoracic aortic dissection. Ascending thoracic aorta measures 4.4 cm at the level of the main pulmonary artery. Enlarged heart size. No pericardial effusion. Coronary artery atherosclerosis. Thoracic aortic atherosclerosis. Main pulmonary artery measures 3.6 cm in diameter. Mediastinum/Nodes: No enlarged mediastinal, hilar, or axillary lymph nodes. Thyroid gland, trachea, and esophagus demonstrate no significant findings. Lungs/Pleura: Mild left basilar atelectasis. No pleural effusion or pneumothorax. Musculoskeletal: No acute osseous abnormality. No aggressive osseous lesion. Degenerative disease with disc height loss and facet arthropathy throughout the thoracic spine. Review of the MIP images confirms the above findings. CTA ABDOMEN AND PELVIS FINDINGS VASCULAR Aorta: Normal caliber aorta without aneurysm, dissection, vasculitis or significant stenosis. Abdominal aortic atherosclerosis. 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 Celiac: Patent without evidence of aneurysm, dissection, vasculitis or significant stenosis. SMA: Patent without evidence of aneurysm, dissection, vasculitis or significant stenosis. Renals: Both renal arteries are patent without evidence of aneurysm, dissection,  vasculitis, fibromuscular dysplasia or significant stenosis. IMA: Patent without evidence of aneurysm, dissection, vasculitis or significant stenosis. Inflow: Left common iliac artery measures 2.5 cm . Right common iliac artery measures 2.2 cm. Atherosclerotic plaque. Veins: No obvious venous abnormality within the limitations of this arterial phase study. Review of the MIP images confirms the above findings. NON-VASCULAR Hepatobiliary: No focal liver abnormality is seen. No gallstones, gallbladder wall thickening, or biliary dilatation. Pancreas: Unremarkable. No pancreatic ductal dilatation or surrounding inflammatory changes. Spleen: Normal in size without focal abnormality. Adrenals/Urinary Tract: No adrenal mass. No urolithiasis or obstructive uropathy. 4.7 cm hypodense, fluid attenuating right inferior hepatic mass most consistent with a cyst. Normal bladder. Stomach/Bowel: Small hiatal hernia. Stomach is within normal limits. Appendix appears normal. No evidence of bowel wall thickening, distention, or inflammatory changes. Diverticulosis without evidence of diverticulitis. Lymphatic: No lymphadenopathy. Reproductive: Prostate is unremarkable. Other: Small fat containing umbilical hernia.  No ascites. Musculoskeletal: No acute osseous abnormality. No aggressive osseous lesion. Degenerative disease with disc height loss and facet arthropathy of the lumbar spine. Review of  the MIP images confirms the above findings. IMPRESSION: 1. No evidence of aortic dissection. 2. Ascending thoracic aorta measures 4.4 cm at the level of the main pulmonary artery. Recommend annual imaging followup by CTA or MRA. This recommendation follows 2010 ACCF/AHA/AATS/ACR/ASA/SCA/SCAI/SIR/STS/SVM Guidelines for the Diagnosis and Management of Patients with Thoracic Aortic Disease. Circulation. 2010; 121: Z329-J242. Aortic aneurysm NOS (ICD10-I71.9) 3. 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 Recommend follow-up every 5 years. This  recommendation follows ACR consensus guidelines: White Paper of the ACR Incidental Findings Committee II on Vascular Findings. J Am Coll Radiol 2013; 68:341-962. 4. Aortic Atherosclerosis (ICD10-I70.0). Electronically Signed   By: Kathreen Devoid   On: 02/07/2020 16:41    Cardiac Studies   08/2017 Echo  - Left ventricle: The cavity size was normal. There was moderate  concentric hypertrophy. Systolic function was normal. The  estimated ejection fraction was in the range of 60% to 65%. Wall  motion was normal; there were no regional wall motion  abnormalities. Doppler parameters are consistent with abnormal  left ventricular relaxation (grade 1 diastolic dysfunction). The  E/e&' ratio is >15, suggesting elevated LV filling pressure.  - Aortic valve: Trileaflet. Sclerosis without stenosis. There was  no regurgitation.  - Aorta: Aortic root dimension: 40 mm (ED). Ascending aortic  diameter: 44 mm (S).  - Ascending aorta: The ascending aorta was moderately dilated.  - Mitral valve: Mildly thickened leaflets . There was trivial  regurgitation.  - Left atrium: Moderately dilated.  - Right atrium: The atrium was mildly dilated.  - Inferior vena cava: The vessel was normal in size. The  respirophasic diameter changes were in the normal range (>= 50%),  consistent with normal central venous pressure.   Impressions:   - Compared to a prior study in 2010, there are few changes. The  ascending aorta is dilated up to 4.4 cm. There is moderate LAE  and mild RAE.   Patient Profile     69 y.o. 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  Assessment & Plan    1. NSTEMI - hsTroponin peak 2591 - for cath today (risks/benefits discussed yesterday), also echo planned - on ASA, heparin, increased statin dose, amlodipine - HR  upper 40s overnight at times so will reduce metoprolol to 12.5mg  BID with hold parameters - did not see pre cath IVF hanging yet so have released orders, sent message to nurse  2. Essential HTN - controlled  3. Hyperlipidemia - atorvastatin titrated this admission - suggest OP labs in about 6 weeks if pt tolerating at f/u  4. Hypokalemia - given 74meq total KCl yesterday, recheck today and continue home potassium - addendum: K 3.3 -> will increase standing KCl to 66meq daily  5. Ascending aortic aneurysm - 4.4cm and stable by CTA this admission, for yearly f/u, also 2.6cm abdominal aortic ectasia for 5 year f/u - follows with Dr. Cyndia Bent - BP control and avoidance of smoking are utmost importance  6. Brief run of atrial arrhythmia overnight 22h -mostly regular with some brief irregularity with potential P waves - only 1 lead available for review at that time so challenging to tell. Follows axis of normal rhythm so likely an atrial arrhythmia. Continue new low dose BB as tolerated - will review with MD - TSH wnl  For questions or updates, please contact [facility] Please consult [email or web address] for contact info under  Cardiology/STEMI.  Signed, Charlie Pitter, PA-C 02/08/2020, 8:07 AM    Patient seen, examined. Available data reviewed. Agree with findings, assessment, and plan as outlined by Melina Copa, PA-C.  The patient is independently interviewed and examined.  He is alert, oriented, in no distress.  HEENT is normal, lungs are clear, heart is regular rate and rhythm with no murmur gallop, abdomen is soft and nontender, extremities have 1+ ankle edema.  The patient is status post PCI of the left circumflex and right coronary artery.  He is examined today in the post catheterization recovery area.  I have personally reviewed his cardiac catheterization films.  Agree with findings and plan as outlined above.  He will require 12 months of uninterrupted dual antiplatelet therapy.  An  echocardiogram is pending.  We will review his echocardiogram as well as his medical program tomorrow morning and I would anticipate hospital discharge tomorrow.  Complete tobacco cessation has been reviewed with the patient.  Sherren Mocha, M.D. 02/08/2020 12:49 PM

## 2020-02-08 NOTE — Progress Notes (Signed)
  Echocardiogram 2D Echocardiogram has been performed.  Connor Martinez 02/08/2020, 1:09 PM

## 2020-02-08 NOTE — Interval H&P Note (Signed)
History and Physical Interval Note:  8/19/2021Cath Lab Visit (complete for each Cath Lab visit)  Clinical Evaluation Leading to the Procedure:   ACS: Yes.    Non-ACS:    Anginal Classification: CCS III  Anti-ischemic medical therapy: Minimal Therapy (1 class of medications)  Non-Invasive Test Results: No non-invasive testing performed  Prior CABG: No previous CABG       10:14 AM  Connor Martinez  has presented today for surgery, with the diagnosis of chest pain.  The various methods of treatment have been discussed with the patient and family. After consideration of risks, benefits and other options for treatment, the patient has consented to  Procedure(s): LEFT HEART CATH AND CORONARY ANGIOGRAPHY (N/A) as a surgical intervention.  The patient's history has been reviewed, patient examined, no change in status, stable for surgery.  I have reviewed the patient's chart and labs.  Questions were answered to the patient's satisfaction.     [name]

## 2020-02-09 ENCOUNTER — Encounter (HOSPITAL_COMMUNITY): Payer: Self-pay | Admitting: Cardiovascular Disease

## 2020-02-09 LAB — CBC
HCT: 39.5 % (ref 39.0–52.0)
Hemoglobin: 13.1 g/dL (ref 13.0–17.0)
MCH: 28.4 pg (ref 26.0–34.0)
MCHC: 33.2 g/dL (ref 30.0–36.0)
MCV: 85.7 fL (ref 80.0–100.0)
Platelets: 211 10*3/uL (ref 150–400)
RBC: 4.61 MIL/uL (ref 4.22–5.81)
RDW: 14.4 % (ref 11.5–15.5)
WBC: 8.3 10*3/uL (ref 4.0–10.5)
nRBC: 0 % (ref 0.0–0.2)

## 2020-02-09 LAB — BASIC METABOLIC PANEL
Anion gap: 9 (ref 5–15)
BUN: 7 mg/dL — ABNORMAL LOW (ref 8–23)
CO2: 27 mmol/L (ref 22–32)
Calcium: 9 mg/dL (ref 8.9–10.3)
Chloride: 105 mmol/L (ref 98–111)
Creatinine, Ser: 0.73 mg/dL (ref 0.61–1.24)
GFR calc Af Amer: >60 mL/min (ref >60)
GFR calc non Af Amer: >60 mL/min (ref >60)
Glucose, Bld: 98 mg/dL (ref 70–99)
Potassium: 3 mmol/L — ABNORMAL LOW (ref 3.5–5.1)
Sodium: 141 mmol/L (ref 135–145)

## 2020-02-09 MED ORDER — CARVEDILOL 3.125 MG PO TABS
3.1250 mg | ORAL_TABLET | Freq: Two times a day (BID) | ORAL | Status: DC
  Administered 2020-02-09 – 2020-02-10 (×2): 3.125 mg via ORAL
  Filled 2020-02-09 (×2): qty 1

## 2020-02-09 MED ORDER — HEART ATTACK BOUNCING BOOK
Freq: Once | Status: AC
  Filled 2020-02-09: qty 1

## 2020-02-09 MED ORDER — POTASSIUM CHLORIDE CRYS ER 20 MEQ PO TBCR
40.0000 meq | EXTENDED_RELEASE_TABLET | Freq: Every day | ORAL | Status: DC
  Administered 2020-02-09 – 2020-02-10 (×2): 40 meq via ORAL
  Filled 2020-02-09 (×2): qty 2

## 2020-02-09 MED ORDER — THE SENSUOUS HEART BOOK
Freq: Once | Status: AC
  Filled 2020-02-09: qty 1

## 2020-02-09 MED ORDER — SPIRONOLACTONE 12.5 MG HALF TABLET
12.5000 mg | ORAL_TABLET | Freq: Every day | ORAL | Status: DC
  Administered 2020-02-09: 12.5 mg via ORAL
  Filled 2020-02-09: qty 1

## 2020-02-09 MED ORDER — POTASSIUM CHLORIDE CRYS ER 20 MEQ PO TBCR
20.0000 meq | EXTENDED_RELEASE_TABLET | Freq: Once | ORAL | Status: AC
  Administered 2020-02-09: 20 meq via ORAL
  Filled 2020-02-09: qty 1

## 2020-02-09 MED ORDER — SPIRONOLACTONE 25 MG PO TABS
25.0000 mg | ORAL_TABLET | Freq: Every day | ORAL | Status: DC
  Administered 2020-02-10: 25 mg via ORAL
  Filled 2020-02-09: qty 1

## 2020-02-09 MED ORDER — ANGIOPLASTY BOOK
Freq: Once | Status: AC
  Filled 2020-02-09: qty 1

## 2020-02-09 MED ORDER — FUROSEMIDE 20 MG PO TABS
20.0000 mg | ORAL_TABLET | Freq: Every day | ORAL | Status: DC
  Administered 2020-02-09 – 2020-02-10 (×2): 20 mg via ORAL
  Filled 2020-02-09 (×2): qty 1

## 2020-02-09 MED FILL — Tirofiban HCl in NaCl 0.9% IV Soln 5 MG/100ML (Base Equiv): INTRAVENOUS | Qty: 100 | Status: AC

## 2020-02-09 NOTE — Progress Notes (Signed)
CARDIAC REHAB PHASE I   PRE:  Rate/Rhythm: 67 SR    BP: sitting 132/70    SaO2: 95 RA  MODE:  Ambulation: 400 ft   POST:  Rate/Rhythm: 82 SR with PACs    BP: sitting 151/69     SaO2: 96 RA  Pt with slow pace, significant SOB. Pt sts this is normal for him. Discussed MI, stents, Brilinta, restrictions, smoking cessation, diet, exercise, and CRPII. Pt receptive. He wants to quit smoking and has done so before for 5 years. Will refer to [place]. Stressed the importance of Brilinta, he sts he takes his meds.  [id number]   [place], ACSM 02/09/2020 10:10 AM

## 2020-02-09 NOTE — Progress Notes (Addendum)
Progress Note  Patient Name: Connor Martinez Date of Encounter: 02/09/2020  Primary Cardiologist: Sherren Mocha, MD  Subjective   Doing well today. No recurrent chest pain symptoms. K low again today.  Inpatient Medications    Scheduled Meds: . amLODipine  10 mg Oral Daily  . aspirin  81 mg Oral Daily  . atorvastatin  80 mg Oral Daily  . metoprolol tartrate  12.5 mg Oral BID  . potassium chloride SA  40 mEq Oral Daily  . sodium chloride flush  3 mL Intravenous Q12H  . sodium chloride flush  3 mL Intravenous Q12H  . sodium chloride flush  3 mL Intravenous Q12H  . ticagrelor  90 mg Oral BID   Continuous Infusions: . sodium chloride    . sodium chloride     PRN Meds: sodium chloride, sodium chloride, acetaminophen, acetaminophen, ALPRAZolam, diphenhydrAMINE, morphine injection, nitroGLYCERIN, ondansetron (ZOFRAN) IV, ondansetron (ZOFRAN) IV, sodium chloride flush, sodium chloride flush, zolpidem   Vital Signs    Vitals:   02/08/20 2122 02/09/20 0109 02/09/20 0447 02/09/20 0451  BP: (!) 147/74 (!) 143/77  (!) 143/85  Pulse: 68 (!) 50  67  Resp:  16    Temp:  98.3 F (36.8 C)  98.3 F (36.8 C)  TempSrc:  Oral  Oral  SpO2:  93%    Weight:   94.4 kg 94.4 kg    Intake/Output Summary (Last 24 hours) at 02/09/2020 0831 Last data filed at 02/09/2020 0109 Gross per 24 hour  Intake 621.59 ml  Output 3170 ml  Net -2548.41 ml   Filed Weights   02/08/20 1452 02/09/20 0447 02/09/20 0451  Weight: 94.8 kg 94.4 kg 94.4 kg    Physical Exam   General: Well developed, well nourished, NAD Neck: Negative for carotid bruits. No JVD Lungs:Clear to ausculation bilaterally. No wheezes, rales, or rhonchi. Breathing is unlabored. Cardiovascular: RRR with S1 S2. No murmurs Abdomen: Soft, non-tender, non-distended. No obvious abdominal masses. Extremities: No edema. Radial pulses 2+ bilaterally Neuro: Alert and oriented. No focal deficits. No facial asymmetry. MAE  spontaneously. Psych: Responds to questions appropriately with normal affect.    Labs    Chemistry Recent Labs  Lab 02/06/20 1315 02/06/20 1315 02/07/20 1249 02/08/20 0818 02/09/20 0440  NA 139   < > 141 139 141  K 3.3*   < > 3.1* 3.3* 3.0*  CL 104   < > 106 103 105  CO2 26   < > '24 25 27  ' GLUCOSE 103*   < > 109* 112* 98  BUN 11   < > 9 7* 7*  CREATININE 0.74   < > 0.72 0.59* 0.73  CALCIUM 9.1   < > 9.1 8.8* 9.0  PROT 6.9  --   --   --   --   ALBUMIN 3.9  --   --   --   --   AST 36  --   --   --   --   ALT 16  --   --   --   --   ALKPHOS 71  --   --   --   --   BILITOT 0.6  --   --   --   --   GFRNONAA  --   --  >60 >60 >60  GFRAA  --   --  >60 >60 >60  ANIONGAP  --   --  '11 11 9   ' < > = values in  this interval not displayed.     Hematology Recent Labs  Lab 02/07/20 1249 02/08/20 0444 02/09/20 0440  WBC 9.9 7.8 8.3  RBC 4.79 4.61 4.61  HGB 13.6 13.1 13.1  HCT 41.3 40.6 39.5  MCV 86.2 88.1 85.7  MCH 28.4 28.4 28.4  MCHC 32.9 32.3 33.2  RDW 14.7 14.8 14.4  PLT 221 197 211    Cardiac EnzymesNo results for input(s): TROPONINI in the last 168 hours. No results for input(s): TROPIPOC in the last 168 hours.   BNPNo results for input(s): BNP, PROBNP in the last 168 hours.   DDimer  Recent Labs  Lab 02/06/20 1315  DDIMER 0.45     Radiology    DG Chest 2 View  Result Date: 02/07/2020 CLINICAL DATA:  Chest pain EXAM: CHEST - 2 VIEW COMPARISON:  February 06, 2020 FINDINGS: There is mild left base atelectasis. The lungs elsewhere are clear. Heart is borderline enlarged with pulmonary vascularity normal. No adenopathy. There is postoperative change in the lower cervical region. There is also postoperative change in the right shoulder. IMPRESSION: Mild left base atelectasis. Lungs elsewhere clear. Heart borderline enlarged. No adenopathy. Electronically Signed   By: Lowella Grip III M.D.   On: 02/07/2020 13:28   CARDIAC CATHETERIZATION  Result Date:  02/08/2020  Mid RCA lesion is 95% stenosed.  Mid Cx lesion is 99% stenosed.  Lat 1st Diag lesion is 100% stenosed.  Prox LAD to Mid LAD lesion is 50% stenosed.  Mid LAD lesion is 50% stenosed.  A drug-eluting stent was successfully placed using a SYNERGY XD 2.25X16.  Post intervention, there is a 0% residual stenosis.  A drug-eluting stent was successfully placed using a SYNERGY XD 2.25X24.  Post intervention, there is a 0% residual stenosis.  Connor Martinez is a 69 y.o. male  [id number] LOCATION:  FACILITY: [facility] PHYSICIAN: [name], M.D. [birth year] DATE OF PROCEDURE:  02/08/2020 DATE OF DISCHARGE: CARDIAC CATHETERIZATION / PCI DES RCA/LCX History obtained from chart review.69 y.o. 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  He was admitted with unstable angina.  His enzymes rose to the 2000 level suggesting had a "non-STEMI.  He does have multiple cardiac risk factors.  Based on this he was referred for diagnostic coronary angiography to define his anatomy. PROCEDURE DESCRIPTION: The patient was brought to the second floor [place] [facility]iac cath lab in the postabsorptive state. He was premedicated with IV Versed and fentanyl. His right wrist was prepped and shaved in usual sterile fashion. Xylocaine 1% was used for local anesthesia. A 6 French sheath was inserted into the right radial artery using standard Seldinger technique. The patient received 6000 units  of heparin intravenously.  A 5 [place] TIG catheter pigtail catheters were used for selective coronary angiography and obtain left heart pressures (LVEDP was 30 mmHg).  Isovue dye was used for the entirety of the case.  Retrograde aorta, ventricular and pullback pressures were recorded.  Radial cocktail was administered via the SideArm sheath. The patient had significant two-vessel disease including  the mid to distal dominant RCA and mid AV groove circumflex.  I elected to intervene on the RCA first.  He received 180 mg of p.o. Brilinta in addition to an additional 5000 as of heparin with an ACT of 351.  Isovue dye was used for the entirety of the intervention.  Retrograde or pressures monitored  during the case. Using a 6 [place] JR4 guide catheter along with 0.14 Prowater guidewire and a 2 mm x 12 mm balloon the mid to distal RCA lesion was predilated.  There did appear to be intraluminal thrombus.  I carefully positioned and deployed a 2.25 x 18 mm long Synergy drug-eluting stent at 14 atm.  There appear to be some thrombus at the distal end of the stent and therefore overlapped an additional 2.25 mm x 12 mm long Synergy drug-eluting stent and dilated the entire stented segment with a stent delivery balloon.  I then postdilated the stented segment with a 2.5 mm x 12 mm long noncompliant balloon at 14 to 16 atm (2.62 mm) resulting reduction of a 95 to 99% mid to distal dominant RCA stenosis with a thrombotic component to 0% residual.  The patient did receive a bolus of intravenous Aggrastat. I then turned my attention to the AV groove circumflex.  Using a 6 [place] XB 3.5 cm guide catheter along with the same Prowater guidewire and predilatation balloon the mid AV groove circumflex was predilated.  I then carefully positioned a 2.25 x 24 mm long Synergy drug-eluting stent and deployed at 14 to 16 atm.  I postdilated the entire stented segment with a 2.5 mm x 12 mm long noncompliant balloon at 16 atm (2.60 mm) resulting reduction of a 99% mid AV groove circumflex with TIMI II flow to 0% residual TIMI-3 flow.  The patient did receive 200 mcg of intra coronary nitroglycerin because of spasm.   Successful two-vessel PCI and stenting of the mid to distal dominant RCA and AV groove circumflex.  He has moderate but noncritical LAD disease.  A small diagonal branch appears to be occluded in the proximal LAD.  His  LVEDP was 30 and therefore he received 40 mg of IV furosemide at the end of the case.  2D echo is pending.  He will need uninterrupted DAPT for at least 12 months as well as guideline directed optimal medical therapy.  Cardiac risk factor modification and medication compliance will be major issues.  The sheath was removed and a TR band was placed on the right wrist to achieve patent hemostasis.  The patient left lab in stable condition. [name]. MD, [facility] 02/08/2020 12:05 PM   ECHOCARDIOGRAM COMPLETE  Result Date: 02/08/2020    ECHOCARDIOGRAM REPORT   Patient Name:   DARIS HARKINS [facility] Date of Exam: 02/08/2020 Medical Rec #:  [id number]     Height:       65.5 in Accession #:    [id number]    Weight:       211.0 lb Date of Birth:  1950/12/11     BSA:          2.035 m Patient Age:    14 years      BP:           155/80 mmHg Patient Gender: M             HR:           58 bpm. Exam Location:  Inpatient Procedure: 2D Echo, Cardiac Doppler, Color Doppler and Strain Analysis                                MODIFIED REPORT: This report was modified by Dorris Carnes MD on 02/08/2020 due to Complete report.  Indications:     R07.89 Other chest pain  History:  Patient has prior history of Echocardiogram examinations, most                  recent 09/17/2017. Acute MI, Abnormal ECG; Risk                  Factors:Dyslipidemia and Current Smoker. Ascending aortic                  aneurysm. Edema.  Sonographer:     Roseanna Rainbow RDCS Referring Phys:  [id number] [place] Diagnosing Phys: Dorris Carnes MD  Sonographer Comments: Technically difficult study due to poor echo windows. Patient is post cath and could not turn. EKG malfunction. IMPRESSIONS  1. Unable to bring up previous echo images Compared to previous echo report, LVEF is mildly down with regional changes noted.  2. LVEF is mildly depressed wiith hypokinesis of the basal/mid infferior,basal inferolateral and basal inferoseptal walls.     Global longitudinal strain is  -14.7%. Left ventricular ejection fraction, by estimation, is 45 to 50%. The left ventricle has mildly decreased function. The left ventricle demonstrates regional wall motion abnormalities (see scoring diagram/findings for description). The left ventricular internal cavity size was severely dilated. There is moderate left ventricular hypertrophy. Left ventricular diastolic parameters are consistent with Grade II diastolic dysfunction (pseudonormalization).  3. Right ventricular systolic function is normal. The right ventricular size is normal.  4. Left atrial size was mild to moderately dilated.  5. The mitral valve is grossly normal. Trivial mitral valve regurgitation.  6. The aortic valve is abnormal. Aortic valve regurgitation is not visualized. Mild aortic valve sclerosis is present, with no evidence of aortic valve stenosis.  7. Aortic dilatation noted. There is mild dilatation of the ascending aorta measuring 44 mm.  8. The inferior vena cava is dilated in size with >50% respiratory variability, suggesting right atrial pressure of 8 mmHg. FINDINGS  Left Ventricle: LVEF is mildly depressed wiith hypokinesis of the basal/mid infferior,basal inferolateral and basal inferoseptal walls. Global longitudinal strain is -14.7%. Left ventricular ejection fraction, by estimation, is 45 to 50%. The left ventricle has mildly decreased function. The left ventricle demonstrates regional wall motion abnormalities. The left ventricular internal cavity size was severely dilated. There is moderate left ventricular hypertrophy. Left ventricular diastolic parameters are consistent with Grade II diastolic dysfunction (pseudonormalization). Right Ventricle: The right ventricular size is normal. Right vetricular wall thickness was not assessed. Right ventricular systolic function is normal. Left Atrium: Left atrial size was mild to moderately dilated. Right Atrium: Right atrial size was normal in size. Pericardium: There is no  evidence of pericardial effusion. Mitral Valve: The mitral valve is grossly normal. Trivial mitral valve regurgitation. Tricuspid Valve: The tricuspid valve is normal in structure. Tricuspid valve regurgitation is trivial. Aortic Valve: The aortic valve is abnormal. Aortic valve regurgitation is not visualized. Mild aortic valve sclerosis is present, with no evidence of aortic valve stenosis. Pulmonic Valve: The pulmonic valve was grossly normal. Pulmonic valve regurgitation is not visualized. Aorta: Aortic dilatation noted. There is mild dilatation of the ascending aorta measuring 44 mm. Venous: The inferior vena cava is dilated in size with greater than 50% respiratory variability, suggesting right atrial pressure of 8 mmHg. IAS/Shunts: The interatrial septum was not assessed.  LEFT VENTRICLE PLAX 2D LVIDd:         6.30 cm      Diastology LVIDs:         4.90 cm      LV e' lateral:  4.60 cm/s LV PW:         1.60 cm      LV E/e' lateral: 23.0 LV IVS:        1.50 cm      LV e' medial:    3.99 cm/s LVOT diam:     2.45 cm      LV E/e' medial:  26.6 LV SV:         113 LV SV Index:   55 LVOT Area:     4.71 cm  LV Volumes (MOD) LV vol d, MOD A2C: 113.0 ml LV vol d, MOD A4C: 172.0 ml LV vol s, MOD A2C: 64.9 ml LV vol s, MOD A4C: 81.8 ml LV SV MOD A2C:     48.1 ml LV SV MOD A4C:     172.0 ml LV SV MOD BP:      69.8 ml RIGHT VENTRICLE             IVC RV S prime:     11.30 cm/s  IVC diam: 2.90 cm TAPSE (M-mode): 2.8 cm LEFT ATRIUM              Index       RIGHT ATRIUM           Index LA diam:        4.00 cm  1.97 cm/m  RA Area:     17.45 cm LA Vol (A2C):   44.8 ml  22.01 ml/m RA Volume:   46.25 ml  22.73 ml/m LA Vol (A4C):   113.0 ml 55.52 ml/m LA Biplane Vol: 75.6 ml  37.15 ml/m  AORTIC VALVE LVOT Vmax:   109.00 cm/s LVOT Vmean:  75.500 cm/s LVOT VTI:    0.239 m  AORTA Ao Root diam: 3.70 cm Ao Asc diam:  4.40 cm MITRAL VALVE MV Area (PHT): 4.68 cm     SHUNTS MV Decel Time: 162 msec     Systemic VTI:  0.24 m MV E  velocity: 106.00 cm/s  Systemic Diam: 2.45 cm MV A velocity: 90.85 cm/s MV E/A ratio:  1.17 Dorris Carnes MD Electronically signed by Dorris Carnes MD Signature Date/Time: 02/08/2020/4:56:44 PM    Final (Updated)    CT Angio Chest/Abd/Pel for Dissection W and/or Wo Contrast  Result Date: 02/07/2020 CLINICAL DATA:  Chest pain, back pain EXAM: CT ANGIOGRAPHY CHEST, ABDOMEN AND PELVIS TECHNIQUE: Non-contrast CT of the chest was initially obtained. Multidetector CT imaging through the chest, abdomen and pelvis was performed using the standard protocol during bolus administration of intravenous contrast. Multiplanar reconstructed images and MIPs were obtained and reviewed to evaluate the vascular anatomy. CONTRAST:  177m OMNIPAQUE IOHEXOL 350 MG/ML SOLN COMPARISON:  None. FINDINGS: CTA CHEST FINDINGS Cardiovascular: Preferential opacification of the thoracic aorta. No thoracic aortic dissection. Ascending thoracic aorta measures 4.4 cm at the level of the main pulmonary artery. Enlarged heart size. No pericardial effusion. Coronary artery atherosclerosis. Thoracic aortic atherosclerosis. Main pulmonary artery measures 3.6 cm in diameter. Mediastinum/Nodes: No enlarged mediastinal, hilar, or axillary lymph nodes. Thyroid gland, trachea, and esophagus demonstrate no significant findings. Lungs/Pleura: Mild left basilar atelectasis. No pleural effusion or pneumothorax. Musculoskeletal: No acute osseous abnormality. No aggressive osseous lesion. Degenerative disease with disc height loss and facet arthropathy throughout the thoracic spine. Review of the MIP images confirms the above findings. CTA ABDOMEN AND PELVIS FINDINGS VASCULAR Aorta: Normal caliber aorta without aneurysm, dissection, vasculitis or significant stenosis. Abdominal aortic atherosclerosis. 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 Celiac: Patent  without evidence of aneurysm, dissection, vasculitis or significant stenosis. SMA: Patent without evidence of  aneurysm, dissection, vasculitis or significant stenosis. Renals: Both renal arteries are patent without evidence of aneurysm, dissection, vasculitis, fibromuscular dysplasia or significant stenosis. IMA: Patent without evidence of aneurysm, dissection, vasculitis or significant stenosis. Inflow: Left common iliac artery measures 2.5 cm . Right common iliac artery measures 2.2 cm. Atherosclerotic plaque. Veins: No obvious venous abnormality within the limitations of this arterial phase study. Review of the MIP images confirms the above findings. NON-VASCULAR Hepatobiliary: No focal liver abnormality is seen. No gallstones, gallbladder wall thickening, or biliary dilatation. Pancreas: Unremarkable. No pancreatic ductal dilatation or surrounding inflammatory changes. Spleen: Normal in size without focal abnormality. Adrenals/Urinary Tract: No adrenal mass. No urolithiasis or obstructive uropathy. 4.7 cm hypodense, fluid attenuating right inferior hepatic mass most consistent with a cyst. Normal bladder. Stomach/Bowel: Small hiatal hernia. Stomach is within normal limits. Appendix appears normal. No evidence of bowel wall thickening, distention, or inflammatory changes. Diverticulosis without evidence of diverticulitis. Lymphatic: No lymphadenopathy. Reproductive: Prostate is unremarkable. Other: Small fat containing umbilical hernia.  No ascites. Musculoskeletal: No acute osseous abnormality. No aggressive osseous lesion. Degenerative disease with disc height loss and facet arthropathy of the lumbar spine. Review of the MIP images confirms the above findings. IMPRESSION: 1. No evidence of aortic dissection. 2. Ascending thoracic aorta measures 4.4 cm at the level of the main pulmonary artery. Recommend annual imaging followup by CTA or MRA. This recommendation follows 2010 ACCF/AHA/AATS/ACR/ASA/SCA/SCAI/SIR/STS/SVM Guidelines for the Diagnosis and Management of Patients with Thoracic Aortic Disease. Circulation. 2010;  121: U438-V818. Aortic aneurysm NOS (ICD10-I71.9) 3. 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 Recommend follow-up every 5 years. This recommendation follows ACR consensus guidelines: White Paper of the ACR Incidental Findings Committee II on Vascular Findings. J Am Coll Radiol 2013; 40:375-436. 4. Aortic Atherosclerosis (ICD10-I70.0). Electronically Signed   By: Kathreen Devoid   On: 02/07/2020 16:41   Telemetry    02/09/20 SB with rates in the upper 40's-lower 50's - Personally Reviewed  ECG    No new tracing as of 02/09/20- Personally Reviewed  Cardiac Studies   08/2017 Echo  - Left ventricle: The cavity size was normal. There was moderate  concentric hypertrophy. Systolic function was normal. The  estimated ejection fraction was in the range of 60% to 65%. Wall  motion was normal; there were no regional wall motion  abnormalities. Doppler parameters are consistent with abnormal  left ventricular relaxation (grade 1 diastolic dysfunction). The  E/e&' ratio is >15, suggesting elevated LV filling pressure.  - Aortic valve: Trileaflet. Sclerosis without stenosis. There was  no regurgitation.  - Aorta: Aortic root dimension: 40 mm (ED). Ascending aortic  diameter: 44 mm (S).  - Ascending aorta: The ascending aorta was moderately dilated.  - Mitral valve: Mildly thickened leaflets . There was trivial  regurgitation.  - Left atrium: Moderately dilated.  - Right atrium: The atrium was mildly dilated.  - Inferior vena cava: The vessel was normal in size. The  respirophasic diameter changes were in the normal range (>= 50%),  consistent with normal central venous pressure.   Impressions:   - Compared to a prior study in 2010, there are few changes. The  ascending aorta is dilated up to 4.4 cm. There is moderate LAE  and mild RAE.   LHC 02/08/20:   Mid RCA lesion is 95% stenosed.  Mid Cx lesion is 99% stenosed.  Lat 1st Diag lesion is 100% stenosed.  Prox  LAD to Mid LAD lesion is 50% stenosed.  Mid LAD lesion is 50% stenosed.  A drug-eluting stent was successfully placed using a SYNERGY XD 2.25X16.  Post intervention, there is a 0% residual stenosis.  A drug-eluting stent was successfully placed using a SYNERGY XD 2.25X24.  Post intervention, there is a 0% residual stenosis.  Echo 02/08/20:  1. Unable to bring up previous echo images Compared to previous echo  report, LVEF is mildly down with regional changes noted.  2. LVEF is mildly depressed wiith hypokinesis of the basal/mid  infferior,basal inferolateral and basal inferoseptal walls.   Global longitudinal strain is -14.7%. Left ventricular ejection  fraction, by estimation, is 45 to 50%. The left ventricle has mildly  decreased function. The left ventricle demonstrates regional wall motion  abnormalities (see scoring diagram/findings  for description). The left ventricular internal cavity size was severely  dilated. There is moderate left ventricular hypertrophy. Left ventricular  diastolic parameters are consistent with Grade II diastolic dysfunction  (pseudonormalization).  3. Right ventricular systolic function is normal. The right ventricular  size is normal.  4. Left atrial size was mild to moderately dilated.  5. The mitral valve is grossly normal. Trivial mitral valve  regurgitation.  6. The aortic valve is abnormal. Aortic valve regurgitation is not  visualized. Mild aortic valve sclerosis is present, with no evidence of  aortic valve stenosis.  7. Aortic dilatation noted. There is mild dilatation of the ascending  aorta measuring 44 mm.  8. The inferior vena cava is dilated in size with >50% respiratory  variability, suggesting right atrial pressure of 8 mmHg.   Patient Profile     69 y.o. 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  Assessment & Plan    1. NSTEMI: -hsTroponin peak 2591 -LHC performed 02/08/20 which showed mid RCA and Mid LCx stenosis at 95 and 99% with successful two-vessel PCI of the mid to distal dominant RCA and AV groove circumflex. Noted to have moderate but noncritical LAD disease. LVEDP was 30 and therefore he received 40 mg of IV furosemide at the end of the case.   -Recommendations are for uninterrupted DAPT with ASA and Brilinta for at least 12 months as well as guideline directed optimal medical therapy.   -Echo with LVEF at 45-50% with hypokinesis of the basal/mid  inferior,basal inferolateral and basal inferoseptal walls -Continue ASA, Brilinta, statin, BB  2. HTN: -Stable, 143/85>143/77>134/67 -Continue metoprolol at 12.5>>monitor for bradycardia  -Will add spiro 12.5 -No ACE/ARB given angio with ACE  3. Hyperlipidemia: -LDL, 117 10/25/2019 -Atorvastatin titrated this admission  -Needs OP labs in about 6 weeks if pt tolerating at f/u  4. Systolic CHF/ischemic cardiomyopathy: -EF on echo at 45-50% with hypokinesis of the basal/mid  inferior,basal inferolateral and basal inferoseptal walls with elevated EDP>>given IV Lasix post cath  -Appear euvolemic on exam  -No ACE/ARB given angioedema with ACE  4. Hypokalemia: -K+ 3.0 today despite replacement yesterday  -Will continue with 56mq QD and given an additional 260m today and recheck BMET later today   5. Ascending aortic aneurysm: -Measures 4.4cm and stable by CTA this admission, for yearly f/u, also 2.6cm abdominal aortic ectasia for 5 year f/u - follows with Dr. BaCyndia BentPlan to continue with aggressive BP control and avoidance of tobacco  6. Bradycardia: -Tele review shows bradycardia with rates in the upper 40's to  lower 50's>patient is asymptomatic  -Will continue metoprolol for now and monitor on tele  -TSH wnl  Signed, Kathyrn Drown NP-C [place] Pager: [phone] 02/09/2020, 8:31  AM     For questions or updates, please contact   Please consult [email or web address] for contact info under Cardiology/STEMI.  Patient seen, examined. Available data reviewed. Agree with findings, assessment, and plan as outlined by Kathyrn Drown, NP-C.  The patient is alert, oriented, in no distress.  JVP is normal, lungs are clear to auscultation bilaterally, heart is regular rate and rhythm with a 2/6 systolic ejection murmur at the right upper sternal border, abdomen is soft, obese, and nontender.  Extremities have trace edema in the pretibial region bilaterally.  The patient's cardiac catheterization films have been reviewed. He has evidence of ACS/non-STEMI with elevated troponin.  His PCI was uncomplicated and he has done very well.  He should continue with aspirin and ticagrelor for at least 12 months.  Complete tobacco cessation is discussed with him.  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  Will reduce amlodipine to 5 mg daily with plans to get him off of that as an outpatient. We will increase his spironolactone to 25 mg daily.  Will add furosemide 20 mg daily.  He cannot take ACE/ARB because of angioedema.  Will repeat labs next week.  Transition low-dose metoprolol to carvedilol.  He appears medically stable for discharge today.  Sherren Mocha, M.D. 02/09/2020 10:42 AM

## 2020-02-09 NOTE — TOC Benefit Eligibility Note (Signed)
Transition of Care [facility]) Benefit Eligibility Note    Patient Details  Name: Connor Martinez MRN: [id number] Date of Birth: 1950-11-25   Medication/Dose: Kary Kos 90mg  bid for 30 da supply  Covered?: Yes  Tier: 3 Drug  Prescription Coverage Preferred Pharmacy: [facility] mail order  Spoke with Person/Company/Phone Number:: Fara Boros. [place] PH# [phone]  Co-Pay: $47.00  Prior Approval: No  Deductible: Unmet       Shelda Altes Phone Number: 02/09/2020, 12:51 PM

## 2020-02-10 ENCOUNTER — Other Ambulatory Visit: Payer: Self-pay | Admitting: Physician Assistant

## 2020-02-10 DIAGNOSIS — E876 Hypokalemia: Secondary | ICD-10-CM

## 2020-02-10 DIAGNOSIS — Z79899 Other long term (current) drug therapy: Secondary | ICD-10-CM

## 2020-02-10 LAB — BASIC METABOLIC PANEL
Anion gap: 12 (ref 5–15)
BUN: 11 mg/dL (ref 8–23)
CO2: 24 mmol/L (ref 22–32)
Calcium: 9.2 mg/dL (ref 8.9–10.3)
Chloride: 103 mmol/L (ref 98–111)
Creatinine, Ser: 0.8 mg/dL (ref 0.61–1.24)
GFR calc Af Amer: >60 mL/min (ref >60)
GFR calc non Af Amer: >60 mL/min (ref >60)
Glucose, Bld: 101 mg/dL — ABNORMAL HIGH (ref 70–99)
Potassium: 3.5 mmol/L (ref 3.5–5.1)
Sodium: 139 mmol/L (ref 135–145)

## 2020-02-10 LAB — CBC
HCT: 39.9 % (ref 39.0–52.0)
Hemoglobin: 13.1 g/dL (ref 13.0–17.0)
MCH: 27.8 pg (ref 26.0–34.0)
MCHC: 32.8 g/dL (ref 30.0–36.0)
MCV: 84.7 fL (ref 80.0–100.0)
Platelets: 231 10*3/uL (ref 150–400)
RBC: 4.71 MIL/uL (ref 4.22–5.81)
RDW: 14.2 % (ref 11.5–15.5)
WBC: 8.6 10*3/uL (ref 4.0–10.5)
nRBC: 0 % (ref 0.0–0.2)

## 2020-02-10 MED ORDER — CARVEDILOL 3.125 MG PO TABS: 3.1250 mg | ORAL_TABLET | Freq: Two times a day (BID) | ORAL | 6 refills | Status: DC

## 2020-02-10 MED ORDER — ATORVASTATIN CALCIUM 40 MG PO TABS: 80.0000 mg | ORAL_TABLET | Freq: Every day | ORAL | 6 refills | Status: DC

## 2020-02-10 MED ORDER — SPIRONOLACTONE 25 MG PO TABS: 25.0000 mg | ORAL_TABLET | Freq: Every day | ORAL | 6 refills | Status: DC

## 2020-02-10 MED ORDER — ASPIRIN EC 81 MG PO TBEC: 81.0000 mg | DELAYED_RELEASE_TABLET | Freq: Every day | ORAL | 11 refills | Status: DC

## 2020-02-10 MED ORDER — TICAGRELOR 90 MG PO TABS: 90.0000 mg | ORAL_TABLET | Freq: Two times a day (BID) | ORAL | 11 refills | Status: DC

## 2020-02-10 MED ORDER — NITROGLYCERIN 0.4 MG SL SUBL: 0.4000 mg | SUBLINGUAL_TABLET | SUBLINGUAL | 12 refills | Status: DC | PRN

## 2020-02-10 MED ORDER — FUROSEMIDE 20 MG PO TABS: 20.0000 mg | ORAL_TABLET | Freq: Every day | ORAL | 11 refills | Status: DC

## 2020-02-10 NOTE — Progress Notes (Signed)
BMEt check In one week

## 2020-02-10 NOTE — Discharge Summary (Addendum)
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  Mid Cx lesion is 99% stenosed.  Lat 1st Diag lesion is 100% stenosed.  Prox LAD to Mid LAD lesion is 50% stenosed.  Mid LAD lesion is 50% stenosed.  A drug-eluting stent was successfully placed using a SYNERGY XD 2.25X16.  Post intervention, there is a 0% residual stenosis.  A drug-eluting stent was successfully placed using a SYNERGY XD 2.25X24.  Post intervention, there is a 0% residual stenosis.   IMPRESSION: Successful two-vessel PCI and stenting of the mid to distal dominant RCA and AV groove circumflex.  He has moderate but noncritical LAD disease.  A small diagonal branch appears to be occluded in the proximal LAD.  His LVEDP was 30 and therefore he received 40 mg of IV furosemide at the end of the case.  2D echo is pending.  He Connor Martinez need uninterrupted DAPT for at least 12 months as well as guideline directed optimal medical therapy.  Cardiac risk factor modification and medication compliance Connor Martinez be major issues.  The sheath was removed and a TR band was placed on the right wrist to achieve patent hemostasis.  The patient left lab in stable condition.  Diagnostic Dominance: Right  Intervention   Echo 02/08/2020  1. Unable to bring up previous echo images Compared to previous echo  report, LVEF is mildly down with regional changes noted.   2. LVEF is mildly  depressed wiith hypokinesis of the basal/mid  infferior,basal inferolateral and basal inferoseptal walls.      Global longitudinal strain is -14.7%. Left ventricular ejection  fraction, by estimation, is 45 to 50%. The left ventricle has mildly  decreased function. The left ventricle demonstrates regional wall motion  abnormalities (see scoring diagram/findings  for description). The left ventricular internal cavity size was severely  dilated. There is moderate left ventricular hypertrophy. Left ventricular  diastolic parameters are consistent with Grade II diastolic dysfunction  (pseudonormalization).   3. Right ventricular systolic function is normal. The right ventricular  size is normal.   4. Left atrial size was mild to moderately dilated.   5. The mitral valve is grossly normal. Trivial mitral valve  regurgitation.   6. The aortic valve is abnormal. Aortic valve regurgitation is not  visualized. Mild aortic valve sclerosis is present, with no evidence of  aortic valve stenosis.   7. Aortic dilatation noted. There is mild dilatation of the ascending  aorta measuring 44 mm.   8. The inferior vena cava is dilated in size with >50% respiratory  variability, suggesting right atrial pressure of 8 mmHg.   History of Present Illness     Connor Martinez is a 69 y.o. male with PMH of BPV, sciatica, angioedema with ACEi, ascending aortic aneurysm, HLD, HTN, obesity, tobacco use who presents to the ED with chest pain and found to have elevated troponin.  He had an echo in 2019 showing preserved  EF and G1DD, ascending aortic diameter 57m, trivial MR, mod dilated LA, mildly dilated RA. He takes amlodipine and hydralazine for BP. Also on atorvastatin for cholesterol. Family history positive for MI in his mother at age 68 He has been a smoker his whole life. No alcohol or drug use. He has an Ascending aortic aneurysm for which he follows with Dr. BCyndia Bent   Few days hx of CP. It is in the left side  of the chest and radiating into his upper back and between the shoulder blades. Pain was not worse with exertion. Denied sob, diaphoresis, N/V. He took 2 aspirin for the pain. The pain continued to wax and wane which is when he decided to go to his PCP's. Troponin level was drawn which cam back to over 2,9000. Advised to go to ER.   HS troponin [id number] EKG showed NSR with possible LVH and repol abnormalities, no ST elevation. Cardiology was called to admit.   Hospital Course     Consultants: None  1. NSTEMI : - hsTroponin peaked at 2591 - LHC performed 02/08/20 which showed mid RCA and Mid LCx stenosis at 95 and 99% with successful two-vessel PCI of the mid to distal dominant RCA and AV groove circumflex. Noted to have moderate but noncritical LAD disease. LVEDP was 30 and therefore he received 40 mg of IV furosemide at the end of the case. - Recommendations are for uninterrupted DAPT with ASA and Brilinta for at least 12 months as well as guideline directed optimal medical therapy.   -Echo with LVEF at 45-50% with hypokinesis of the basal/mid  inferior,basal inferolateral and basal inferoseptal walls. - Ambulated well. No recurrent pain - Continue ASA, Brillinta, statin and BB  2. Acute Systolic CHF/ischemic cardiomyopathy: -EF on echo at 45-50% with hypokinesis of the basal/mid  inferior,basal inferolateral and basal inferoseptal walls with elevated EDP>>given IV Lasix post cath  - No ACE/ARB given angioedema with ACE - Diuresed total 4 L. Discharge weight 9lb. - He Travian Martinez continue Lasix and spironolactone - Metoprolol changed to Carvedilol - Plan was to reduce Amlodipine to 5 mg and eventually discontinue as outpatient > but hold off currently given minimally elevated blood pressure   3. Essential HTN: - Relatively controlled but minimally up at time. Medications as above   4. Hyperlipidemia: - 10/25/2019: Cholesterol 184; HDL 50.70; LDL Cholesterol 117; Triglycerides 77.0; VLDL 15.4    - Continue high intensity statin  - Consider OP f/u labs 6-8 weeks given statin initiation this admission.   5. Hypokalemia: - Supplemented - Resolved - BMET at follow up    6. Ascending aortic aneurysm: - 4.4cm and stable by CTA this admission, for yearly f/u, also 2.6cm abdominal aortic ectasia for 5 year f/u - follows with Dr. BCyndia Bent- BP control and avoidance of smoking are utmost importance   7. Tobacco smoking: - Cessation recommended - Education given    Did the patient have an acute coronary syndrome (MI, NSTEMI, STEMI, etc) this admission?:  Yes                               AHA/ACC Clinical Performance & Quality Measures: 1. Aspirin prescribed? - Yes 2. ADP Receptor Inhibitor (Plavix/Clopidogrel, Brilinta/Ticagrelor or Effient/Prasugrel) prescribed (includes medically managed patients)? - Yes 3. Beta Blocker prescribed? - Yes 4. High Intensity Statin (Lipitor 40-876mor Crestor 20-4026mprescribed? - Yes 5. EF assessed during THIS hospitalization? - Yes 6. For EF <  40%, was ACEI/ARB prescribed? - No - Reason:  allergy 7. For EF <40%, Aldosterone Antagonist (Spironolactone or Eplerenone) prescribed? - Yes 8. Cardiac Rehab Phase II ordered (including medically managed patients)? - Yes   _____________  Discharge Vitals Blood pressure (!) 157/87, pulse (!) 59, temperature 98.5 F (36.9 C), temperature source Oral, resp. rate 18, weight 94.9 kg, SpO2 97 %.  Filed Weights   02/09/20 0447 02/09/20 0451 02/10/20 0500  Weight: 94.4 kg 94.4 kg 94.9 kg   Physical Exam Constitutional:      Appearance: Normal appearance.  HENT:     Head: Normocephalic.  Eyes:     Extraocular Movements: Extraocular movements intact.     Pupils: Pupils are equal, round, and reactive to light.  Cardiovascular:     Rate and Rhythm: Normal rate and regular rhythm.     Pulses: Normal pulses.     Heart sounds: Normal heart sounds.  Pulmonary:     Effort: Pulmonary effort is normal.      Breath sounds: Normal breath sounds.  Abdominal:     General: Abdomen is flat. Bowel sounds are normal.     Palpations: Abdomen is soft.  Musculoskeletal:        General: Normal range of motion.     Cervical back: Normal range of motion and neck supple.  Skin:    General: Skin is warm and dry.  Neurological:     General: No focal deficit present.     Mental Status: He is alert and oriented to person, place, and time.  Psychiatric:        Mood and Affect: Mood normal.    Labs & Radiologic Studies    CBC Recent Labs    02/09/20 0440 02/10/20 0218  WBC 8.3 8.6  HGB 13.1 13.1  HCT 39.5 39.9  MCV 85.7 84.7  PLT 211 734   Basic Metabolic Panel Recent Labs    02/07/20 2042 02/08/20 0818 02/09/20 0440 02/10/20 0218  NA  --    < > 141 139  K  --    < > 3.0* 3.5  CL  --    < > 105 103  CO2  --    < > 27 24  GLUCOSE  --    < > 98 101*  BUN  --    < > 7* 11  CREATININE  --    < > 0.73 0.80  CALCIUM  --    < > 9.0 9.2  MG 1.9  --   --   --    < > = values in this interval not displayed.   High Sensitivity Troponin:   Recent Labs  Lab 02/07/20 1249 02/07/20 1454  TROPONINIHS 2,436* 2,591*    Hemoglobin A1C Recent Labs    02/07/20 2042  HGBA1C 5.3   Thyroid Function Tests Recent Labs    02/07/20 2043  TSH 2.083   _____________  DG Chest 2 View  Result Date: 02/07/2020 CLINICAL DATA:  Chest pain EXAM: CHEST - 2 VIEW COMPARISON:  February 06, 2020 FINDINGS: There is mild left base atelectasis. The lungs elsewhere are clear. Heart is borderline enlarged with pulmonary vascularity normal. No adenopathy. There is postoperative change in the lower cervical region. There is also postoperative change in the right shoulder. IMPRESSION: Mild left base atelectasis. Lungs elsewhere clear. Heart borderline enlarged. No adenopathy. Electronically Signed   By: Lowella Grip III M.D.   On: 02/07/2020 13:28   DG Chest 2 View  Result Date: 02/07/2020 CLINICAL DATA:  Chest  pain EXAM: CHEST - 2 VIEW COMPARISON:  2006 FINDINGS: No new consolidation or edema. Borderline cardiomegaly. Ascending thoracic aorta dilatation is not well evaluated. No pleural effusion or pneumothorax. No acute osseous abnormality. IMPRESSION: No acute process in the chest. Electronically Signed   By: Macy Mis M.D.   On: 02/07/2020 11:53   CARDIAC CATHETERIZATION  Result Date: 02/08/2020  Mid RCA lesion is 95% stenosed.  Mid Cx lesion is 99% stenosed.  Lat 1st Diag lesion is 100% stenosed.  Prox LAD to Mid LAD lesion is 50% stenosed.  Mid LAD lesion is 50% stenosed.  A drug-eluting stent was successfully placed using a SYNERGY XD 2.25X16.  Post intervention, there is a 0% residual stenosis.  A drug-eluting stent was successfully placed using a SYNERGY XD 2.25X24.  Post intervention, there is a 0% residual stenosis.  KAYVION ARNESON is a 69 y.o. male  [id number] LOCATION:  FACILITY: [facility] PHYSICIAN: [name], M.D. [birth year] DATE OF PROCEDURE:  02/08/2020 DATE OF DISCHARGE: CARDIAC CATHETERIZATION / PCI DES RCA/LCX History obtained from chart review.69 y.o. male with BPV, sciatica, angioedema with ACEi, ascending aortic aneurysm (4.4cm) and abdominal aortic ectasia (2.6cm), HLD, HTN, obesity, tobacco use presented to [facility] with chest pain and NSTEMI.  He was admitted with unstable angina.  His enzymes rose to the 2000 level suggesting had a "non-STEMI.  He does have multiple cardiac risk factors.  Based on this he was referred for diagnostic coronary angiography to define his anatomy. PROCEDURE DESCRIPTION: The patient was brought to the second floor [facility] lab in the postabsorptive state. He was premedicated with IV Versed and fentanyl. His right wrist was prepped and shaved in usual sterile fashion. Xylocaine 1% was used for local anesthesia. A 6 French sheath was inserted into the right radial artery using standard Seldinger technique. The patient received 6000 units  of  heparin intravenously.  A 5 [place] TIG catheter pigtail catheters were used for selective coronary angiography and obtain left heart pressures (LVEDP was 30 mmHg).  Isovue dye was used for the entirety of the case.  Retrograde aorta, ventricular and pullback pressures were recorded.  Radial cocktail was administered via the SideArm sheath. The patient had significant two-vessel disease including the mid to distal dominant RCA and mid AV groove circumflex.  I elected to intervene on the RCA first.  He received 180 mg of p.o. Brilinta in addition to an additional 5000 as of heparin with an ACT of 351.  Isovue dye was used for the entirety of the intervention.  Retrograde or pressures monitored during the case. Using a 6 [place] JR4 guide catheter along with 0.14 Prowater guidewire and a 2 mm x 12 mm balloon the mid to distal RCA lesion was predilated.  There did appear to be intraluminal thrombus.  I carefully positioned and deployed a 2.25 x 18 mm long Synergy drug-eluting stent at 14 atm.  There appear to be some thrombus at the distal end of the stent and therefore overlapped an additional 2.25 mm x 12 mm long Synergy drug-eluting stent and dilated the entire stented segment with a stent delivery balloon.  I then postdilated the stented segment with a 2.5 mm x 12 mm long noncompliant balloon at 14 to 16 atm (2.62 mm) resulting reduction of a 95 to 99% mid to distal dominant RCA stenosis with a thrombotic component to 0% residual.  The patient did receive a bolus of intravenous Aggrastat.  I then turned my attention to the AV groove circumflex.  Using a 6 [place] XB 3.5 cm guide catheter along with the same Prowater guidewire and predilatation balloon the mid AV groove circumflex was predilated.  I then carefully positioned a 2.25 x 24 mm long Synergy drug-eluting stent and deployed at 14 to 16 atm.  I postdilated the entire stented segment with a 2.5 mm x 12 mm long noncompliant balloon at 16 atm (2.60 mm) resulting  reduction of a 99% mid AV groove circumflex with TIMI II flow to 0% residual TIMI-3 flow.  The patient did receive 200 mcg of intra coronary nitroglycerin because of spasm.   Successful two-vessel PCI and stenting of the mid to distal dominant RCA and AV groove circumflex.  He has moderate but noncritical LAD disease.  A small diagonal branch appears to be occluded in the proximal LAD.  His LVEDP was 30 and therefore he received 40 mg of IV furosemide at the end of the case.  2D echo is pending.  He Tiwanna Tuch need uninterrupted DAPT for at least 12 months as well as guideline directed optimal medical therapy.  Cardiac risk factor modification and medication compliance Jakarie Pember be major issues.  The sheath was removed and a TR band was placed on the right wrist to achieve patent hemostasis.  The patient left lab in stable condition. [name]. MD, [facility] 02/08/2020 12:05 PM   ECHOCARDIOGRAM COMPLETE  Result Date: 02/08/2020    ECHOCARDIOGRAM REPORT   Patient Name:   Connor Martinez [facility] Date of Exam: 02/08/2020 Medical Rec #:  [id number]     Height:       65.5 in Accession #:    [id number]    Weight:       211.0 lb Date of Birth:  21-Oct-1950     BSA:          2.035 m Patient Age:    78 years      BP:           155/80 mmHg Patient Gender: M             HR:           58 bpm. Exam Location:  Inpatient Procedure: 2D Echo, Cardiac Doppler, Color Doppler and Strain Analysis                                MODIFIED REPORT: This report was modified by Dorris Carnes MD on 02/08/2020 due to Complete report.  Indications:     R07.89 Other chest pain  History:         Patient has prior history of Echocardiogram examinations, most                  recent 09/17/2017. Acute MI, Abnormal ECG; Risk                  Factors:Dyslipidemia and Current Smoker. Ascending aortic                  aneurysm. Edema.  Sonographer:     Roseanna Rainbow RDCS Referring Phys:  [id number] [place] Diagnosing Phys: Dorris Carnes MD  Sonographer Comments: Technically  difficult study due to poor echo windows. Patient is post cath and could not turn. EKG malfunction. IMPRESSIONS  1. Unable to bring up previous echo images Compared to previous echo report, LVEF is mildly down with regional changes noted.  2.  LVEF is mildly depressed wiith hypokinesis of the basal/mid infferior,basal inferolateral and basal inferoseptal walls.     Global longitudinal strain is -14.7%. Left ventricular ejection fraction, by estimation, is 45 to 50%. The left ventricle has mildly decreased function. The left ventricle demonstrates regional wall motion abnormalities (see scoring diagram/findings for description). The left ventricular internal cavity size was severely dilated. There is moderate left ventricular hypertrophy. Left ventricular diastolic parameters are consistent with Grade II diastolic dysfunction (pseudonormalization).  3. Right ventricular systolic function is normal. The right ventricular size is normal.  4. Left atrial size was mild to moderately dilated.  5. The mitral valve is grossly normal. Trivial mitral valve regurgitation.  6. The aortic valve is abnormal. Aortic valve regurgitation is not visualized. Mild aortic valve sclerosis is present, with no evidence of aortic valve stenosis.  7. Aortic dilatation noted. There is mild dilatation of the ascending aorta measuring 44 mm.  8. The inferior vena cava is dilated in size with >50% respiratory variability, suggesting right atrial pressure of 8 mmHg. FINDINGS  Left Ventricle: LVEF is mildly depressed wiith hypokinesis of the basal/mid infferior,basal inferolateral and basal inferoseptal walls. Global longitudinal strain is -14.7%. Left ventricular ejection fraction, by estimation, is 45 to 50%. The left ventricle has mildly decreased function. The left ventricle demonstrates regional wall motion abnormalities. The left ventricular internal cavity size was severely dilated. There is moderate left ventricular hypertrophy. Left  ventricular diastolic parameters are consistent with Grade II diastolic dysfunction (pseudonormalization). Right Ventricle: The right ventricular size is normal. Right vetricular wall thickness was not assessed. Right ventricular systolic function is normal. Left Atrium: Left atrial size was mild to moderately dilated. Right Atrium: Right atrial size was normal in size. Pericardium: There is no evidence of pericardial effusion. Mitral Valve: The mitral valve is grossly normal. Trivial mitral valve regurgitation. Tricuspid Valve: The tricuspid valve is normal in structure. Tricuspid valve regurgitation is trivial. Aortic Valve: The aortic valve is abnormal. Aortic valve regurgitation is not visualized. Mild aortic valve sclerosis is present, with no evidence of aortic valve stenosis. Pulmonic Valve: The pulmonic valve was grossly normal. Pulmonic valve regurgitation is not visualized. Aorta: Aortic dilatation noted. There is mild dilatation of the ascending aorta measuring 44 mm. Venous: The inferior vena cava is dilated in size with greater than 50% respiratory variability, suggesting right atrial pressure of 8 mmHg. IAS/Shunts: The interatrial septum was not assessed.  LEFT VENTRICLE PLAX 2D LVIDd:         6.30 cm      Diastology LVIDs:         4.90 cm      LV e' lateral:   4.60 cm/s LV PW:         1.60 cm      LV E/e' lateral: 23.0 LV IVS:        1.50 cm      LV e' medial:    3.99 cm/s LVOT diam:     2.45 cm      LV E/e' medial:  26.6 LV SV:         113 LV SV Index:   55 LVOT Area:     4.71 cm  LV Volumes (MOD) LV vol d, MOD A2C: 113.0 ml LV vol d, MOD A4C: 172.0 ml LV vol s, MOD A2C: 64.9 ml LV vol s, MOD A4C: 81.8 ml LV SV MOD A2C:     48.1 ml LV SV MOD A4C:     172.0 ml  LV SV MOD BP:      69.8 ml RIGHT VENTRICLE             IVC RV S prime:     11.30 cm/s  IVC diam: 2.90 cm TAPSE (M-mode): 2.8 cm LEFT ATRIUM              Index       RIGHT ATRIUM           Index LA diam:        4.00 cm  1.97 cm/m  RA Area:      17.45 cm LA Vol (A2C):   44.8 ml  22.01 ml/m RA Volume:   46.25 ml  22.73 ml/m LA Vol (A4C):   113.0 ml 55.52 ml/m LA Biplane Vol: 75.6 ml  37.15 ml/m  AORTIC VALVE LVOT Vmax:   109.00 cm/s LVOT Vmean:  75.500 cm/s LVOT VTI:    0.239 m  AORTA Ao Root diam: 3.70 cm Ao Asc diam:  4.40 cm MITRAL VALVE MV Area (PHT): 4.68 cm     SHUNTS MV Decel Time: 162 msec     Systemic VTI:  0.24 m MV E velocity: 106.00 cm/s  Systemic Diam: 2.45 cm MV A velocity: 90.85 cm/s MV E/A ratio:  1.17 Dorris Carnes MD Electronically signed by Dorris Carnes MD Signature Date/Time: 02/08/2020/4:56:44 PM    Final (Updated)    CT Angio Chest/Abd/Pel for Dissection W and/or Wo Contrast  Result Date: 02/07/2020 CLINICAL DATA:  Chest pain, back pain EXAM: CT ANGIOGRAPHY CHEST, ABDOMEN AND PELVIS TECHNIQUE: Non-contrast CT of the chest was initially obtained. Multidetector CT imaging through the chest, abdomen and pelvis was performed using the standard protocol during bolus administration of intravenous contrast. Multiplanar reconstructed images and MIPs were obtained and reviewed to evaluate the vascular anatomy. CONTRAST:  153m OMNIPAQUE IOHEXOL 350 MG/ML SOLN COMPARISON:  None. FINDINGS: CTA CHEST FINDINGS Cardiovascular: Preferential opacification of the thoracic aorta. No thoracic aortic dissection. Ascending thoracic aorta measures 4.4 cm at the level of the main pulmonary artery. Enlarged heart size. No pericardial effusion. Coronary artery atherosclerosis. Thoracic aortic atherosclerosis. Main pulmonary artery measures 3.6 cm in diameter. Mediastinum/Nodes: No enlarged mediastinal, hilar, or axillary lymph nodes. Thyroid gland, trachea, and esophagus demonstrate no significant findings. Lungs/Pleura: Mild left basilar atelectasis. No pleural effusion or pneumothorax. Musculoskeletal: No acute osseous abnormality. No aggressive osseous lesion. Degenerative disease with disc height loss and facet arthropathy throughout the thoracic spine.  Review of the MIP images confirms the above findings. CTA ABDOMEN AND PELVIS FINDINGS VASCULAR Aorta: Normal caliber aorta without aneurysm, dissection, vasculitis or significant stenosis. Abdominal aortic atherosclerosis. 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 Celiac: Patent without evidence of aneurysm, dissection, vasculitis or significant stenosis. SMA: Patent without evidence of aneurysm, dissection, vasculitis or significant stenosis. Renals: Both renal arteries are patent without evidence of aneurysm, dissection, vasculitis, fibromuscular dysplasia or significant stenosis. IMA: Patent without evidence of aneurysm, dissection, vasculitis or significant stenosis. Inflow: Left common iliac artery measures 2.5 cm . Right common iliac artery measures 2.2 cm. Atherosclerotic plaque. Veins: No obvious venous abnormality within the limitations of this arterial phase study. Review of the MIP images confirms the above findings. NON-VASCULAR Hepatobiliary: No focal liver abnormality is seen. No gallstones, gallbladder wall thickening, or biliary dilatation. Pancreas: Unremarkable. No pancreatic ductal dilatation or surrounding inflammatory changes. Spleen: Normal in size without focal abnormality. Adrenals/Urinary Tract: No adrenal mass. No urolithiasis or obstructive uropathy. 4.7 cm hypodense, fluid attenuating right inferior hepatic mass most  consistent with a cyst. Normal bladder. Stomach/Bowel: Small hiatal hernia. Stomach is within normal limits. Appendix appears normal. No evidence of bowel wall thickening, distention, or inflammatory changes. Diverticulosis without evidence of diverticulitis. Lymphatic: No lymphadenopathy. Reproductive: Prostate is unremarkable. Other: Small fat containing umbilical hernia.  No ascites. Musculoskeletal: No acute osseous abnormality. No aggressive osseous lesion. Degenerative disease with disc height loss and facet arthropathy of the lumbar spine. Review of the MIP images  confirms the above findings. IMPRESSION: 1. No evidence of aortic dissection. 2. Ascending thoracic aorta measures 4.4 cm at the level of the main pulmonary artery. Recommend annual imaging followup by CTA or MRA. This recommendation follows 2010 ACCF/AHA/AATS/ACR/ASA/SCA/SCAI/SIR/STS/SVM Guidelines for the Diagnosis and Management of Patients with Thoracic Aortic Disease. Circulation. 2010; 121: W258-N277. Aortic aneurysm NOS (ICD10-I71.9) 3. 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 Recommend follow-up every 5 years. This recommendation follows ACR consensus guidelines: White Paper of the ACR Incidental Findings Committee II on Vascular Findings. J Am Coll Radiol 2013; 82:423-536. 4. Aortic Atherosclerosis (ICD10-I70.0). Electronically Signed   By: Kathreen Devoid   On: 02/07/2020 16:41   Disposition   Pt is being discharged home today in good condition.  Follow-up Plans & Appointments     Follow-up Information     Liliane Shi, PA-C. Go on 03/08/2020.   Specialties: Cardiology, Physician Assistant Why: '@10' :15am for hospital follow up with Dr. Antionette Char PA Contact information: [place]. [address] [place] [address] [phone]                Discharge Instructions     Amb Referral to Cardiac Rehabilitation   Complete by: As directed    Diagnosis:  Coronary Stents NSTEMI PTCA     After initial evaluation and assessments completed: Virtual Based Care may be provided alone or in conjunction with Phase 2 Cardiac Rehab based on patient barriers.: Yes   Diet - low sodium heart healthy   Complete by: As directed    Discharge instructions   Complete by: As directed    NO HEAVY LIFTING (>10lbs) X 2 WEEKS. NO SEXUAL ACTIVITY X 2 WEEKS. NO DRIVING X 1 WEEK. NO SOAKING BATHS, HOT TUBS, POOLS, ETC., X 7 DAYS.   Increase activity slowly   Complete by: As directed        Discharge Medications   Allergies as of 02/10/2020       Reactions   Ace Inhibitors  Swelling   angioedema   Penicillins Rash        Medication List     STOP taking these medications    hydrALAZINE 100 MG tablet Commonly known as: APRESOLINE       TAKE these medications    amLODipine 10 MG tablet Commonly known as: NORVASC Take 1 tablet (10 mg total) by mouth daily.   aspirin EC 81 MG tablet Take 1 tablet (81 mg total) by mouth daily. Swallow whole.   atorvastatin 40 MG tablet Commonly known as: LIPITOR Take 2 tablets (80 mg total) by mouth daily. What changed:   medication strength  how much to take   carvedilol 3.125 MG tablet Commonly known as: COREG Take 1 tablet (3.125 mg total) by mouth 2 (two) times daily with a meal.   clindamycin 1 % gel Commonly known as: Clindagel Apply topically 2 (two) times daily.   diphenhydrAMINE 25 MG tablet Commonly known as: BENADRYL Take 25 mg by mouth every 6 (six) hours as needed (swelling).   furosemide 20 MG tablet  Commonly known as: LASIX Take 1 tablet (20 mg total) by mouth daily. Start taking on: February 11, 2020   nitroGLYCERIN 0.4 MG SL tablet Commonly known as: NITROSTAT Place 1 tablet (0.4 mg total) under the tongue every 5 (five) minutes x 3 doses as needed for chest pain.   potassium chloride SA 20 MEQ tablet Commonly known as: KLOR-CON Take 1 tablet (20 mEq total) by mouth daily.   spironolactone 25 MG tablet Commonly known as: ALDACTONE Take 1 tablet (25 mg total) by mouth daily. Start taking on: February 11, 2020   ticagrelor 90 MG Tabs tablet Commonly known as: BRILINTA Take 1 tablet (90 mg total) by mouth 2 (two) times daily.   triamcinolone ointment 0.5 % Commonly known as: KENALOG Apply 1 application topically 2 (two) times daily.           Outstanding Labs/Studies   BMET in one week Lipid panel and LFTS in 6 weeks   Duration of Discharge Encounter   Greater than 30 minutes including physician time.  SignedLeanor Kail, PA 02/10/2020, 9:50 AM   I  have seen and examined this patient with Vin Bhagat.  Agree with above, note added to reflect my findings.  On exam, RRR, no murmurs.  Patient admitted with non-STEMI.  Is now status post two-vessel PCI to the RCA and circumflex.  Tranise Forrest M. Makai Dumond MD 02/10/2020 10:34 AM

## 2020-02-10 NOTE — Progress Notes (Signed)
2182 Pt walking halls independently. Gait steady. Since ed done and pt walking independently, will sign off. Graylon Good RN BSN 02/10/2020 9:08 AM

## 2020-02-14 ENCOUNTER — Telehealth (HOSPITAL_COMMUNITY): Payer: Self-pay

## 2020-02-14 NOTE — Telephone Encounter (Signed)
Attempted to call patient in regards to Cardiac Rehab - voicemail full, unable to leave VM

## 2020-02-14 NOTE — Telephone Encounter (Signed)
Pt insurance is active and benefits verified through [facility]. Co-pay $10.00, DED $0.00/$0.00 met, out of pocket $3,900.00/$0.00 met, co-insurance 0%. No pre-authorization required. Passport, 02/14/20 @ 1:57PM, ERQ#[id number]  Will contact patient to see if he is interested in the Cardiac Rehab Program. If interested, patient will need to complete follow up appt. Once completed, patient will be contacted for scheduling upon review by the RN Navigator.

## 2020-02-15 ENCOUNTER — Other Ambulatory Visit: Payer: Medicare HMO

## 2020-02-16 ENCOUNTER — Other Ambulatory Visit: Payer: Self-pay

## 2020-02-16 ENCOUNTER — Other Ambulatory Visit: Payer: Medicare HMO | Admitting: *Deleted

## 2020-02-16 DIAGNOSIS — Z79899 Other long term (current) drug therapy: Secondary | ICD-10-CM

## 2020-02-16 DIAGNOSIS — E876 Hypokalemia: Secondary | ICD-10-CM | POA: Diagnosis not present

## 2020-02-16 LAB — BASIC METABOLIC PANEL
BUN/Creatinine Ratio: 15 (ref 10–24)
BUN: 11 mg/dL (ref 8–27)
CO2: 24 mmol/L (ref 20–29)
Calcium: 9.1 mg/dL (ref 8.6–10.2)
Chloride: 101 mmol/L (ref 96–106)
Creatinine, Ser: 0.74 mg/dL — ABNORMAL LOW (ref 0.76–1.27)
GFR calc Af Amer: 110 mL/min/{1.73_m2} (ref >59)
GFR calc non Af Amer: 95 mL/min/{1.73_m2} (ref >59)
Glucose: 105 mg/dL — ABNORMAL HIGH (ref 65–99)
Potassium: 3.9 mmol/L (ref 3.5–5.2)
Sodium: 139 mmol/L (ref 134–144)

## 2020-02-19 ENCOUNTER — Telehealth: Payer: Self-pay | Admitting: Physician Assistant

## 2020-02-19 NOTE — Telephone Encounter (Signed)
Patient calling back to get results.

## 2020-02-19 NOTE — Telephone Encounter (Signed)
Returned call to pt and he has been made aware of his lab results and verbalized understanding.

## 2020-03-07 NOTE — Progress Notes (Signed)
Cardiology Office Note:    Date:  03/08/2020   ID:  [name], DOB Jul 16, 1950, MRN [id number]  PCP:  [name], MD  [facility] Cardiologist:  Sherren Mocha, MD  [place] Electrophysiologist:  None   Referring MD: Marrian Salvage,*   Chief Complaint:  Hospitalization Follow-up (s/p MI>>PCI)    Patient Profile:    Connor Martinez is a 69 y.o. male with:   Coronary artery disease   S/p NSTEMI 8/21 tx with DES to [facility] and DES to [facility]  Heart failure with reduced ejection fraction   Ischemic CM  Echocardiogram 8/21: EF 45-50, Gr 2 DD  Thoracic aortic aneurysm  CT 8/21: 4.4 cm  Followed by Dr. Joaquim Nam aortic ectasia  Hypertension   Angioedema with ACEi  Hyperlipidemia   Obesity   Tobacco use    Prior CV studies: Echocardiogram 02/08/20 Inf HK, EF 45-50, GLS-14.7%, severe LVE, mod LVH, Gr 2 DD, normal RVSF, mild to mod LAE, trivial MR, mild dilation of ascending aorta (44 mm)  Cardiac catheterization 02/08/20 LAD prox 50, mid 50; Lat D1 100 LCx mid 99 RCA mid 95 (thrombotic) LVEDP 36 PCI:  2.25 x 24 mm Synergy DES to mLCx PCI:  2.25 x 16 mm Synergy DES to mRCA   Chest/Abd/Pelvic CTA 02/07/20 IMPRESSION: 1. No evidence of aortic dissection. 2. Ascending thoracic aorta measures 4.4 cm at the level of the main pulmonary artery. Recommend annual imaging followup by CTA or MRA. This recommendation follows 2010 ACCF/AHA/AATS/ACR/ASA/SCA/SCAI/SIR/STS/SVM Guidelines for the Diagnosis and Management of Patients with Thoracic Aortic Disease. Circulation. 2010; 121: H062-B762. Aortic aneurysm NOS (ICD10-I71.9) 3. 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 Recommend follow-up every 5 years. This recommendation follows ACR consensus guidelines: White Paper of the ACR Incidental Findings Committee II on Vascular Findings. J Am Coll Radiol 2013; 83:151-761. 4. Aortic Atherosclerosis (ICD10-I70.0).  Echocardiogram 09/15/17 EF 60-65, Gr  1 DD, mod LVH, Ao root 40 mm, Asc Ao 44 mm, tr MR, mild RAE, mod LAE  History of Present Illness:    Connor Martinez was admitted 8/18-8/21 with a NSTEMI.  Cardiac catheterization demonstrated critical stenosis in the mLCx and mRCA.  Both lesions were treated with a DES.  He has an occluded lateral D1 and there is residual moderate non-obstructive disease in the LAD.  He required IV Furosemide in the cath lab as his LVEDP was 36.  He was discharged on carvedilol, furosemide and spironolactone.  He was not placed on ACE inhibitor, ARB or Entresto due to prior history of angioedema with ACE inhibitor therapy.  He returns for follow-up.  He is here alone.  Since discharge, he has been doing well without chest discomfort or significant shortness of breath.  He has not had PND or leg swelling or syncope.  He sleeps on an incline chronically.  Past Medical History:  Diagnosis Date  . Angioedema   . BPV (benign positional vertigo)   . Hemorrhoids   . Hyperlipidemia   . Sciatica    MRI with disc herniations L3-4, L4-5 s/p epidural injections by Dr [name]    Current Medications: Current Meds  Medication Sig  . amLODipine (NORVASC) 10 MG tablet Take 1 tablet (10 mg total) by mouth daily.  Marland Kitchen aspirin EC 81 MG tablet Take 1 tablet (81 mg total) by mouth daily. Swallow whole.  Marland Kitchen atorvastatin (LIPITOR) 40 MG tablet Take 2 tablets (80 mg total) by mouth daily.  . carvedilol (COREG) 3.125 MG tablet Take 1  tablet (3.125 mg total) by mouth 2 (two) times daily with a meal.  . furosemide (LASIX) 20 MG tablet Take 1 tablet (20 mg total) by mouth daily.  . nitroGLYCERIN (NITROSTAT) 0.4 MG SL tablet Place 1 tablet (0.4 mg total) under the tongue every 5 (five) minutes x 3 doses as needed for chest pain.  . potassium chloride SA (KLOR-CON) 20 MEQ tablet Take 1 tablet (20 mEq total) by mouth daily.  Marland Kitchen spironolactone (ALDACTONE) 25 MG tablet Take 1 tablet (25 mg total) by mouth daily.  . ticagrelor (BRILINTA) 90 MG TABS  tablet Take 1 tablet (90 mg total) by mouth 2 (two) times daily.     Allergies:   Ace inhibitors and Penicillins   Social History   Tobacco Use  . Smoking status: Current Every Day Smoker  . Smokeless tobacco: Never Used  Substance Use Topics  . Alcohol use: Yes    Comment: infrequent  . Drug use: Not Currently    Types: Marijuana    Comment: occasional     Family Hx: The patient's family history includes Heart disease in his mother; Hepatitis C in his brother; Hypertension in his brother and mother; Stroke in his mother.  ROS   EKGs/Labs/Other Test Reviewed:    EKG:  EKG is  ordered today.  The ekg ordered today demonstrates normal sinus rhythm, heart 75, left bundle branch block, QTC 484  Recent Labs: 02/06/2020: ALT 16 02/07/2020: Magnesium 1.9; TSH 2.083 02/10/2020: Hemoglobin 13.1; Platelets 231 02/16/2020: BUN 11; Creatinine, Ser 0.74; Potassium 3.9; Sodium 139   Recent Lipid Panel Lab Results  Component Value Date/Time   CHOL 184 10/25/2019 02:42 PM   TRIG 77.0 10/25/2019 02:42 PM   HDL 50.70 10/25/2019 02:42 PM   CHOLHDL 4 10/25/2019 02:42 PM   LDLCALC 117 (H) 10/25/2019 02:42 PM    AHA/ACC Clinical Performance & Quality Measures at [place]: 1. Aspirin prescribed? - Yes 2. ADP Receptor Inhibitor (Plavix/Clopidogrel, Brilinta/Ticagrelor or Effient/Prasugrel) prescribed (includes medically managed patients)? - Yes 3. Beta Blocker prescribed? - Yes 4. High Intensity Statin (Lipitor 40-80mg  or Crestor 20-40mg ) prescribed? - Yes 5. EF assessed during THIS hospitalization? - Yes 6. For EF <40%, was ACEI/ARB prescribed? - No - Reason:  allergy 7. For EF <40%, Aldosterone Antagonist (Spironolactone or Eplerenone) prescribed? - Yes 8. Cardiac Rehab Phase II ordered (including medically managed patients)? - Yes  Physical Exam:    VS:  BP 120/60   Pulse 75   Ht 5' 5.5" (1.664 m)   Wt 205 lb (93 kg)   SpO2 96%   BMI 33.59 kg/m     Wt Readings from Last 3  Encounters:  03/08/20 205 lb (93 kg)  02/10/20 209 lb 4.8 oz (94.9 kg)  02/06/20 211 lb (95.7 kg)     Constitutional:      Appearance: Healthy appearance. Not in distress.  Neck:     Vascular: JVD normal.  Pulmonary:     Breath sounds: No wheezing. No rales.  Cardiovascular:     Normal rate. Regular rhythm. Normal S1. Normal S2.     Murmurs: There is no murmur.     Comments: R wrist without hematoma Edema:    Peripheral edema absent.  Abdominal:     Palpations: Abdomen is soft.  Skin:    General: Skin is warm and dry.  Neurological:     General: No focal deficit present.     Mental Status: Alert and oriented to person, place and time.  Cranial Nerves: Cranial nerves are intact.       ASSESSMENT & PLAN:    1. NSTEMI (non-ST elevated myocardial infarction) ([place]) Status post DES to the RCA and DES to LCx.  He has residual disease with an occluded lateral D1 and moderate nonobstructive disease in the LAD.  He is doing well without anginal symptoms.  We discussed the importance of dual antiplatelet therapy for the next 12 months.  He understands he should contact our office if he cannot afford Brilinta so that we can choose alternate therapy.  Continue amlodipine, aspirin, atorvastatin, carvedilol, ticagrelor.  I have encouraged him to start cardiac rehabilitation.  Follow-up in 3 months.  2. HFrEF (heart failure with reduced ejection fraction) ([place]) 3. Ischemic cardiomyopathy EF 45-50.  NYHA II.  Volume status appears stable.  He is allergic to ACE inhibitor's with angioedema.  Therefore, he is not a candidate for ARB or Entresto.  Continue carvedilol, spironolactone, furosemide.  Obtain follow-up BMET today.  4. Thoracic aortic aneurysm without rupture ([place]) Followed by Dr. Cyndia Bent.  Recent CT with stable measurement of 4.4 cm.  5. Essential hypertension The patient's blood pressure is controlled on his current regimen.  Continue current therapy.   6. Mixed  hyperlipidemia Continue high intensity statin therapy.  Arrange fasting CMET, lipids in 6 weeks.  7. Tobacco abuse We discussed the importance of smoking cessation including his significantly elevated risk of recurrent MI should he continue to smoke.  I have recommended cessation.    Dispo:  Return in about 3 months (around 06/07/2020) for Routine Follow Up, w/ Dr. Burt Knack, or Richardson Dopp, PA-C, in person.   Medication Adjustments/Labs and Tests Ordered: Current medicines are reviewed at length with the patient today.  Concerns regarding medicines are outlined above.  Tests Ordered: Orders Placed This Encounter  Procedures  . Comprehensive metabolic panel  . Lipid panel  . Basic metabolic panel  . EKG 12-Lead   Medication Changes: No orders of the defined types were placed in this encounter.   Signed, Richardson Dopp, PA-C  03/08/2020 10:59 AM    [facility] [place], [place],   [address] Phone: [phone]; Fax: [phone]

## 2020-03-08 ENCOUNTER — Encounter: Payer: Self-pay | Admitting: Physician Assistant

## 2020-03-08 ENCOUNTER — Other Ambulatory Visit: Payer: Self-pay

## 2020-03-08 ENCOUNTER — Ambulatory Visit (INDEPENDENT_AMBULATORY_CARE_PROVIDER_SITE_OTHER): Payer: Medicare HMO | Admitting: Physician Assistant

## 2020-03-08 VITALS — BP 120/60 | HR 75 | Ht 65.5 in | Wt 205.0 lb

## 2020-03-08 DIAGNOSIS — I5020 Unspecified systolic (congestive) heart failure: Secondary | ICD-10-CM

## 2020-03-08 DIAGNOSIS — I712 Thoracic aortic aneurysm, without rupture, unspecified: Secondary | ICD-10-CM

## 2020-03-08 DIAGNOSIS — I10 Essential (primary) hypertension: Secondary | ICD-10-CM

## 2020-03-08 DIAGNOSIS — I214 Non-ST elevation (NSTEMI) myocardial infarction: Secondary | ICD-10-CM | POA: Diagnosis not present

## 2020-03-08 DIAGNOSIS — I255 Ischemic cardiomyopathy: Secondary | ICD-10-CM

## 2020-03-08 DIAGNOSIS — E782 Mixed hyperlipidemia: Secondary | ICD-10-CM

## 2020-03-08 DIAGNOSIS — Z720 Tobacco use: Secondary | ICD-10-CM | POA: Diagnosis not present

## 2020-03-08 LAB — BASIC METABOLIC PANEL
BUN/Creatinine Ratio: 9 — ABNORMAL LOW (ref 10–24)
BUN: 7 mg/dL — ABNORMAL LOW (ref 8–27)
CO2: 24 mmol/L (ref 20–29)
Calcium: 9.5 mg/dL (ref 8.6–10.2)
Chloride: 103 mmol/L (ref 96–106)
Creatinine, Ser: 0.78 mg/dL (ref 0.76–1.27)
GFR calc Af Amer: 108 mL/min/{1.73_m2} (ref >59)
GFR calc non Af Amer: 93 mL/min/{1.73_m2} (ref >59)
Glucose: 105 mg/dL — ABNORMAL HIGH (ref 65–99)
Potassium: 4 mmol/L (ref 3.5–5.2)
Sodium: 141 mmol/L (ref 134–144)

## 2020-03-08 NOTE — Patient Instructions (Addendum)
Medication Instructions:  Your physician recommends that you continue on your current medications as directed. Please refer to the Current Medication list given to you today.  *If you need a refill on your cardiac medications before your next appointment, please call your pharmacy*  Lab Work: You will have labs drawn today: BMET   Your physician recommends that you return for lab work in 6 weeks on 04/19/20. **The lab is open from 7:30AM-4:30PM** You may come anytime between those hours. Come fasting to this appointment your cholesterol will be checked.  Testing/Procedures: None ordered today  Follow-Up: On 06/07/20 at 10:15AM with Richardson Dopp, PA-C

## 2020-03-12 ENCOUNTER — Telehealth (HOSPITAL_COMMUNITY): Payer: Self-pay | Admitting: *Deleted

## 2020-03-12 NOTE — Telephone Encounter (Signed)
-----   Message from Liliane Shi, [place] sent at 03/12/2020  8:02 AM EDT ----- Regarding: RE: Any restrictions for Cardiac Rehab Thanks Desire Fulp. I would not exercise him if his BP is 160/95 or higher. I would avoid lifting more than 40 lbs. Scott ----- Message ----- From: Rowe Pavy, RN Sent: 03/08/2020   3:08 PM EDT To: Liliane Shi, PA-C Subject: Any restrictions for Cardiac Rehab               The above pt seen by you this morning.  Pt is eligible to participate in Cardiac Rehab. Noted in his medical history 4.4 thoracic aneurysm. Last CT 8/18.  Any restrictions or BP parameters warranted for cardiac rehab?  If so, please provide parameters.  Thanks so much Psychologist, clinical, BSN Cardiac and Training and development officer

## 2020-03-14 ENCOUNTER — Telehealth (HOSPITAL_COMMUNITY): Payer: Self-pay

## 2020-03-14 ENCOUNTER — Encounter (HOSPITAL_COMMUNITY): Payer: Self-pay

## 2020-03-14 NOTE — Telephone Encounter (Signed)
Attempted to call patient in regards to Cardiac Rehab - LM on VM Mailed letter 

## 2020-03-14 NOTE — Telephone Encounter (Signed)
Pt returned CR, pt stated he was interested in the program and will call back tomorrow to get schedule. Went over insurance, patient verbalized understanding.

## 2020-04-05 ENCOUNTER — Telehealth (HOSPITAL_COMMUNITY): Payer: Self-pay

## 2020-04-05 NOTE — Telephone Encounter (Signed)
Unable to reach pt in regards to Cardiac Rehab Closed referral 

## 2020-04-19 ENCOUNTER — Other Ambulatory Visit: Payer: Medicare HMO | Admitting: *Deleted

## 2020-04-19 ENCOUNTER — Other Ambulatory Visit: Payer: Self-pay | Admitting: *Deleted

## 2020-04-19 ENCOUNTER — Other Ambulatory Visit: Payer: Self-pay

## 2020-04-19 DIAGNOSIS — E782 Mixed hyperlipidemia: Secondary | ICD-10-CM | POA: Diagnosis not present

## 2020-04-19 DIAGNOSIS — I5020 Unspecified systolic (congestive) heart failure: Secondary | ICD-10-CM | POA: Diagnosis not present

## 2020-04-19 DIAGNOSIS — I10 Essential (primary) hypertension: Secondary | ICD-10-CM

## 2020-04-19 DIAGNOSIS — I255 Ischemic cardiomyopathy: Secondary | ICD-10-CM

## 2020-04-19 LAB — COMPREHENSIVE METABOLIC PANEL
ALT: 16 IU/L (ref 0–44)
AST: 14 IU/L (ref 0–40)
Albumin/Globulin Ratio: 1.6 (ref 1.2–2.2)
Albumin: 4.2 g/dL (ref 3.8–4.8)
Alkaline Phosphatase: 99 IU/L (ref 44–121)
BUN/Creatinine Ratio: 18 (ref 10–24)
BUN: 12 mg/dL (ref 8–27)
Bilirubin Total: 0.2 mg/dL (ref 0.0–1.2)
CO2: 26 mmol/L (ref 20–29)
Calcium: 9 mg/dL (ref 8.6–10.2)
Chloride: 104 mmol/L (ref 96–106)
Creatinine, Ser: 0.66 mg/dL — ABNORMAL LOW (ref 0.76–1.27)
GFR calc Af Amer: 115 mL/min/{1.73_m2} (ref >59)
GFR calc non Af Amer: 99 mL/min/{1.73_m2} (ref >59)
Globulin, Total: 2.6 g/dL (ref 1.5–4.5)
Glucose: 102 mg/dL — ABNORMAL HIGH (ref 65–99)
Potassium: 4.4 mmol/L (ref 3.5–5.2)
Sodium: 141 mmol/L (ref 134–144)
Total Protein: 6.8 g/dL (ref 6.0–8.5)

## 2020-04-19 LAB — LIPID PANEL
Chol/HDL Ratio: 2.9 ratio (ref 0.0–5.0)
Cholesterol, Total: 128 mg/dL (ref 100–199)
HDL: 44 mg/dL (ref >39)
LDL Chol Calc (NIH): 68 mg/dL (ref 0–99)
Triglycerides: 83 mg/dL (ref 0–149)
VLDL Cholesterol Cal: 16 mg/dL (ref 5–40)

## 2020-04-22 ENCOUNTER — Telehealth: Payer: Self-pay

## 2020-04-22 NOTE — Telephone Encounter (Signed)
I called and spoke with patient about lab results. Patient states that the lower left side of his lip is swelling today. Patient states that this has been going on for about 5 years intermittently, he states it could be both lips that swell or just one and it can happen on the right or left side. He also states that sometimes it's just his tongue that swells a little bit. Patient states that he just takes 2 Benadryl and the swelling goes down.

## 2020-04-23 NOTE — Telephone Encounter (Signed)
He should f/u with primary care for this to discuss +/- referral to allergist. Richardson Dopp, PA-C    04/23/2020 7:38 AM

## 2020-04-25 NOTE — Telephone Encounter (Signed)
I called and spoke with patient, he is aware to follow up with primary care.

## 2020-04-26 ENCOUNTER — Ambulatory Visit: Payer: Medicare HMO | Admitting: Internal Medicine

## 2020-05-03 ENCOUNTER — Other Ambulatory Visit: Payer: Self-pay | Admitting: Physician Assistant

## 2020-05-10 ENCOUNTER — Other Ambulatory Visit: Payer: Self-pay | Admitting: Physician Assistant

## 2020-05-28 ENCOUNTER — Telehealth: Payer: Self-pay | Admitting: Internal Medicine

## 2020-05-28 DIAGNOSIS — L0292 Furuncle, unspecified: Secondary | ICD-10-CM

## 2020-05-28 NOTE — Telephone Encounter (Signed)
Patient called and was wondering if Dr. [name] would be able to get him a referral to a demonologist. He said that he has a pimple that is causing pain and irritation and he said that it grew to a considerable size and bothers him when he sits down. He can be reached at [phone]

## 2020-05-28 NOTE — Telephone Encounter (Signed)
Referral ordered for Dr. Nevada Crane.

## 2020-06-03 ENCOUNTER — Telehealth: Payer: Self-pay | Admitting: Internal Medicine

## 2020-06-03 NOTE — Progress Notes (Signed)
°  Chronic Care Management   Outreach Note  06/03/2020 Name: Connor Martinez MRN: [id number] DOB: [birth year]  Referred by: [name], MD Reason for referral : No chief complaint on file.   An unsuccessful telephone outreach was attempted today. The patient was referred to the pharmacist for assistance with care management and care coordination.   Follow Up Plan:   Carley Perdue [employer] Scheduler

## 2020-06-06 NOTE — Progress Notes (Deleted)
Cardiology Office Note:    Date:  06/06/2020   ID:  Connor, Martinez 04-Sep-1950, MRN [id number]  PCP:  [name], MD  [facility] Cardiologist:  Sherren Mocha, MD *** [facility] Electrophysiologist:  None   Referring MD: [name], MD   Chief Complaint:  No chief complaint on file.    Patient Profile:    Connor Martinez is a 69 y.o. male with:   Coronary artery disease  ? S/p NSTEMI 8/21 tx with DES to [facility] and DES to mLCx  Heart failure with reduced ejection fraction  ? Ischemic CM ? Echocardiogram 8/21: EF 45-50, Gr 2 DD  Thoracic aortic aneurysm ? CT 8/21: 4.4 cm ? Followed by Dr. Joaquim Nam aortic ectasia  Hypertension  ? Angioedema with ACEi  Hyperlipidemia   Obesity   Tobacco use    Prior CV studies: Echocardiogram 02/08/20 Inf HK, EF 45-50, GLS-14.7%, severe LVE, mod LVH, Gr 2 DD, normal RVSF, mild to mod LAE, trivial MR, mild dilation of ascending aorta (44 mm)  Cardiac catheterization 02/08/20 LAD prox 50, mid 50; Lat D1 100 LCx mid 99 RCA mid 95 (thrombotic) LVEDP 36 PCI:  2.25 x 24 mm Synergy DES to mLCx PCI:  2.25 x 16 mm Synergy DES to mRCA   Chest/Abd/Pelvic CTA 02/07/20 IMPRESSION: 1. No evidence of aortic dissection. 2. Ascending thoracic aorta measures 4.4 cm at the level of the main pulmonary artery. Recommend annual imaging followup by CTA or MRA. This recommendation follows 2010 ACCF/AHA/AATS/ACR/ASA/SCA/SCAI/SIR/STS/SVM Guidelines for the Diagnosis and Management of Patients with Thoracic Aortic Disease. Circulation. 2010; 121: B510-C585. Aortic aneurysm NOS (ICD10-I71.9) 3. 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 Recommend follow-up every 5 years. This recommendation follows ACR consensus guidelines: White Paper of the ACR Incidental Findings Committee II on Vascular Findings. J Am Coll Radiol 2013; 27:782-423. 4. Aortic Atherosclerosis (ICD10-I70.0).  Echocardiogram 09/15/17 EF 60-65, Gr 1 DD, mod  LVH, Ao root 40 mm, Asc Ao 44 mm, tr MR, mild RAE, mod LAE  History of Present Illness:    Connor Martinez was admitted in 01/2020 with a NSTEMI.  Cardiac catheterization demonstrated critical stenosis in the mLCx and mRCA.  Both lesions were treated with a DES.  He has an occluded lateral D1 and there is residual moderate non-obstructive disease in the LAD.  He required IV Furosemide in the cath lab as his LVEDP was 36.  He was discharged on carvedilol, furosemide and spironolactone.  He was not placed on ACE inhibitor, ARB or Entresto due to prior history of angioedema with ACE inhibitor therapy.  He was last seen in follow-up 03/08/2020.    He returns for follow-up.  ***      Past Medical History:  Diagnosis Date  . Angioedema   . BPV (benign positional vertigo)   . Hemorrhoids   . Hyperlipidemia   . Sciatica    MRI with disc herniations L3-4, L4-5 s/p epidural injections by Dr [name]    Current Medications: No outpatient medications have been marked as taking for the 06/07/20 encounter (Appointment) with Connor Dopp T, PA-C.     Allergies:   Ace inhibitors and Penicillins   Social History   Tobacco Use  . Smoking status: Current Every Day Smoker  . Smokeless tobacco: Never Used  Substance Use Topics  . Alcohol use: Yes    Comment: infrequent  . Drug use: Not Currently    Types: Marijuana    Comment: occasional  Family Hx: The patient's family history includes Heart disease in his mother; Hepatitis C in his brother; Hypertension in his brother and mother; Stroke in his mother.  ROS   EKGs/Labs/Other Test Reviewed:    EKG:  EKG is *** ordered today.  The ekg ordered today demonstrates ***  Recent Labs: 02/07/2020: Magnesium 1.9; TSH 2.083 02/10/2020: Hemoglobin 13.1; Platelets 231 04/19/2020: ALT 16; BUN 12; Creatinine, Ser 0.66; Potassium 4.4; Sodium 141   Recent Lipid Panel Lab Results  Component Value Date/Time   CHOL 128 04/19/2020 09:07 AM   TRIG 83 04/19/2020  09:07 AM   HDL 44 04/19/2020 09:07 AM   CHOLHDL 2.9 04/19/2020 09:07 AM   CHOLHDL 4 10/25/2019 02:42 PM   LDLCALC 68 04/19/2020 09:07 AM      Risk Assessment/Calculations:   {Does this patient have ATRIAL FIBRILLATION?[phone]}  Physical Exam:    VS:  There were no vitals taken for this visit.    Wt Readings from Last 3 Encounters:  03/08/20 205 lb (93 kg)  02/10/20 209 lb 4.8 oz (94.9 kg)  02/06/20 211 lb (95.7 kg)     Physical Exam ***  ASSESSMENT & PLAN:    ***  1. NSTEMI (non-ST elevated myocardial infarction) ([place]) Status post DES to the RCA and DES to LCx.  He has residual disease with an occluded lateral D1 and moderate nonobstructive disease in the LAD.  He is doing well without anginal symptoms.  We discussed the importance of dual antiplatelet therapy for the next 12 months.  He understands he should contact our office if he cannot afford Brilinta so that we can choose alternate therapy.  Continue amlodipine, aspirin, atorvastatin, carvedilol, ticagrelor.  I have encouraged him to start cardiac rehabilitation.  Follow-up in 3 months.  2. HFrEF (heart failure with reduced ejection fraction) ([place]) 3. Ischemic cardiomyopathy EF 45-50.  NYHA II.  Volume status appears stable.  He is allergic to ACE inhibitor's with angioedema.  Therefore, he is not a candidate for ARB or Entresto.  Continue carvedilol, spironolactone, furosemide.  Obtain follow-up BMET today.  4. Thoracic aortic aneurysm without rupture ([place]) Followed by Dr. Cyndia Bent.  Recent CT with stable measurement of 4.4 cm.  5. Essential hypertension The patient's blood pressure is controlled on his current regimen.  Continue current therapy.   6. Mixed hyperlipidemia Continue high intensity statin therapy.  Arrange fasting CMET, lipids in 6 weeks.  7. Tobacco abuse We discussed the importance of smoking cessation including his significantly elevated risk of recurrent MI should he continue to smoke.  I  have recommended cessation.     {Are you ordering a CV Procedure (e.g. stress test, cath, DCCV, TEE, etc)?   Press F2        :[id number]}    Dispo:  No follow-ups on file.   Medication Adjustments/Labs and Tests Ordered: Current medicines are reviewed at length with the patient today.  Concerns regarding medicines are outlined above.  Tests Ordered: No orders of the defined types were placed in this encounter.  Medication Changes: No orders of the defined types were placed in this encounter.   Signed, Connor Dopp, PA-C  06/06/2020 8:59 AM    [facility] [place], [place], [place]  [address] Phone: [phone]; Fax: [phone]

## 2020-06-07 ENCOUNTER — Ambulatory Visit: Payer: Medicare HMO | Admitting: Physician Assistant

## 2020-06-07 DIAGNOSIS — L720 Epidermal cyst: Secondary | ICD-10-CM | POA: Diagnosis not present

## 2020-06-07 NOTE — Telephone Encounter (Signed)
I called and spoke with patient, appointment for today has been cancelled. Apologized for any missed communication about his appointment at our office today. Patient states that he will call back to reschedule appointment from today.

## 2020-06-10 ENCOUNTER — Telehealth: Payer: Self-pay | Admitting: Internal Medicine

## 2020-06-10 NOTE — Progress Notes (Signed)
  Chronic Care Management   Outreach Note  06/10/2020 Name: Connor Martinez MRN: [id number] DOB: 1950/07/13  Referred by: [name], MD Reason for referral : No chief complaint on file.   A second unsuccessful telephone outreach was attempted today. The patient was referred to pharmacist for assistance with care management and care coordination.  Follow Up Plan:   Carley Perdue [facility] Scheduler

## 2020-06-11 ENCOUNTER — Other Ambulatory Visit (HOSPITAL_COMMUNITY): Payer: Self-pay | Admitting: Surgery

## 2020-06-11 ENCOUNTER — Other Ambulatory Visit: Payer: Self-pay | Admitting: Surgery

## 2020-06-11 DIAGNOSIS — L02215 Cutaneous abscess of perineum: Secondary | ICD-10-CM

## 2020-06-12 ENCOUNTER — Ambulatory Visit (HOSPITAL_COMMUNITY)
Admission: RE | Admit: 2020-06-12 | Discharge: 2020-06-12 | Disposition: A | Discharge status: Home / Self Care | Payer: Medicare HMO | Source: Ambulatory Visit | Attending: Surgery | Admitting: Surgery

## 2020-06-12 ENCOUNTER — Other Ambulatory Visit: Payer: Self-pay

## 2020-06-12 ENCOUNTER — Encounter (HOSPITAL_COMMUNITY): Payer: Self-pay

## 2020-06-12 DIAGNOSIS — N400 Enlarged prostate without lower urinary tract symptoms: Secondary | ICD-10-CM | POA: Diagnosis not present

## 2020-06-12 DIAGNOSIS — L02215 Cutaneous abscess of perineum: Secondary | ICD-10-CM | POA: Diagnosis not present

## 2020-06-12 DIAGNOSIS — R1909 Other intra-abdominal and pelvic swelling, mass and lump: Secondary | ICD-10-CM | POA: Diagnosis not present

## 2020-06-12 DIAGNOSIS — Z8739 Personal history of other diseases of the musculoskeletal system and connective tissue: Secondary | ICD-10-CM | POA: Diagnosis not present

## 2020-06-12 LAB — POCT I-STAT CREATININE: Creatinine, Ser: 0.8 mg/dL (ref 0.61–1.24)

## 2020-06-12 MED ORDER — IOHEXOL 300 MG/ML  SOLN
100.0000 mL | Freq: Once | INTRAMUSCULAR | Status: AC | PRN
  Administered 2020-06-12: 17:00:00 100 mL via INTRAVENOUS

## 2020-06-13 ENCOUNTER — Observation Stay: Admission: AD | Admit: 2020-06-13 | Payer: Medicare HMO | Source: Ambulatory Visit

## 2020-06-18 ENCOUNTER — Telehealth: Payer: Self-pay | Admitting: Internal Medicine

## 2020-06-18 DIAGNOSIS — L02215 Cutaneous abscess of perineum: Secondary | ICD-10-CM

## 2020-06-18 NOTE — Telephone Encounter (Signed)
Patient is requesting another referral for a dermatologist. He can be reached at [phone].

## 2020-06-20 ENCOUNTER — Other Ambulatory Visit: Payer: Self-pay | Admitting: Physician Assistant

## 2020-06-20 ENCOUNTER — Other Ambulatory Visit: Payer: Self-pay | Admitting: Internal Medicine

## 2020-06-21 NOTE — Telephone Encounter (Signed)
Please call him - if this is regarding the abscess he just had the Ct scan for he may need to see surgery not derm - they would also be easier to get into.  Is he ok if I refer him to surgery.  He should also consider coming in to see me - he likely needs to be an antibiotics.

## 2020-06-25 NOTE — Addendum Note (Signed)
Addended by: Pincus Sanes on: 06/25/2020 07:22 AM   Modules accepted: Orders

## 2020-06-25 NOTE — Telephone Encounter (Signed)
I referred him to a dermatologist - Dr Nita Sells who should be able to get him in quickly- if not we will need to try surgery since it is difficult to get in with a dermatologist.     It is difficult for me to know who he needs to see since I have not seen the area

## 2020-06-27 NOTE — Telephone Encounter (Signed)
Spoke with patient and info given 

## 2020-07-03 DIAGNOSIS — A630 Anogenital (venereal) warts: Secondary | ICD-10-CM | POA: Diagnosis not present

## 2020-07-03 DIAGNOSIS — L281 Prurigo nodularis: Secondary | ICD-10-CM | POA: Diagnosis not present

## 2020-07-09 ENCOUNTER — Telehealth: Payer: Self-pay | Admitting: Internal Medicine

## 2020-07-09 NOTE — Telephone Encounter (Signed)
LVM for pt to rtn my call to schedule AWV with [facility]. Please schedule appt if pt calls the office.  

## 2020-07-11 ENCOUNTER — Telehealth: Payer: Self-pay | Admitting: Internal Medicine

## 2020-07-11 NOTE — Progress Notes (Signed)
  Chronic Care Management   Note  07/11/2020 Name: Connor Martinez MRN: [id number] DOB: 1951/02/01  Connor Martinez is a 70 y.o. year old male who is a primary care patient of Burns, [name], MD. I reached out to [name] by phone today in response to a referral sent by Connor Martinez PCP, [name], MD.   Connor Martinez was given information about Chronic Care Management services today including:  1. CCM service includes personalized support from designated clinical staff supervised by his physician, including individualized plan of care and coordination with other care providers 2. 24/7 contact phone numbers for assistance for urgent and routine care needs. 3. Service will only be billed when office clinical staff spend 20 minutes or more in a month to coordinate care. 4. Only one practitioner may furnish and bill the service in a calendar month. 5. The patient may stop CCM services at any time (effective at the end of the month) by phone call to the office staff.   Patient agreed to services and verbal consent obtained.   Follow up plan:   Carley Perdue [facility] Scheduler

## 2020-07-11 NOTE — Progress Notes (Signed)
  Chronic Care Management   Note  07/11/2020 Name: Connor Martinez MRN: [id number] DOB: 12/23/1950  Connor Martinez is a 69 y.o. year old male who is a primary care patient of Burns, [name], MD. I reached out to Connor Martinez by phone today in response to a referral sent by Connor Martinez's PCP, Burns, [name], MD.   [name] was given information about Chronic Care Management services today including:  1. CCM service includes personalized support from designated clinical staff supervised by his physician, including individualized plan of care and coordination with other care providers 2. 24/7 contact phone numbers for assistance for urgent and routine care needs. 3. Service will only be billed when office clinical staff spend 20 minutes or more in a month to coordinate care. 4. Only one practitioner may furnish and bill the service in a calendar month. 5. The patient may stop CCM services at any time (effective at the end of the month) by phone call to the office staff.   Patient agreed to services and verbal consent obtained.   Follow up plan:   Carley Perdue [facility] Scheduler 

## 2020-07-15 ENCOUNTER — Telehealth: Payer: Self-pay | Admitting: Physician Assistant

## 2020-07-15 MED ORDER — TICAGRELOR 90 MG PO TABS: 90.0000 mg | ORAL_TABLET | Freq: Two times a day (BID) | ORAL | 8 refills | Status: DC

## 2020-07-15 NOTE — Telephone Encounter (Signed)
Pt's medication was sent to pt's pharmacy as requested. Confirmation received.  °

## 2020-07-15 NOTE — Telephone Encounter (Signed)
Patient calling in to check and see if medication was sent over to pharmacy. Confirmed that is was via Cathy's last phone note. No further call needed

## 2020-07-15 NOTE — Telephone Encounter (Signed)
*  STAT* If patient is at the pharmacy, call can be transferred to refill team.   1. Which medications need to be refilled? (please list name of each medication and dose if known) brilinta  2. Which pharmacy/location (including street and city if local pharmacy) is medication to be sent to? [id number]  3. Do they need a 30 day or 90 day supply? 60 pills patient is out had them order but now patient is out.

## 2020-08-13 ENCOUNTER — Telehealth: Payer: Self-pay | Admitting: Pharmacist

## 2020-08-13 NOTE — Progress Notes (Signed)
Chronic Care Management Pharmacy Assistant   Name: Connor Martinez  MRN: [id number] DOB: December 08, 1950  Reason for Encounter: Initial Questions    PCP : [name], MD  Allergies:   Allergies  Allergen Reactions  . Ace Inhibitors Swelling    angioedema  . Penicillins Rash    Medications: Outpatient Encounter Medications as of 08/13/2020  Medication Sig  . amLODipine (NORVASC) 10 MG tablet TAKE 1 TABLET BY MOUTH EVERY DAY  . aspirin EC 81 MG tablet Take 1 tablet (81 mg total) by mouth daily. Swallow whole.  Marland Kitchen atorvastatin (LIPITOR) 40 MG tablet TAKE 2 TABLETS BY MOUTH EVERY DAY  . carvedilol (COREG) 3.125 MG tablet TAKE 1 TABLET (3.125 MG TOTAL) BY MOUTH 2 (TWO) TIMES DAILY WITH A MEAL.  . furosemide (LASIX) 20 MG tablet Take 1 tablet (20 mg total) by mouth daily.  Marland Kitchen KLOR-CON M20 20 MEQ tablet TAKE 1 TABLET BY MOUTH EVERY DAY  . nitroGLYCERIN (NITROSTAT) 0.4 MG SL tablet Place 1 tablet (0.4 mg total) under the tongue every 5 (five) minutes x 3 doses as needed for chest pain.  Marland Kitchen spironolactone (ALDACTONE) 25 MG tablet TAKE 1 TABLET BY MOUTH EVERY DAY  . ticagrelor (BRILINTA) 90 MG TABS tablet Take 1 tablet (90 mg total) by mouth 2 (two) times daily.  . [DISCONTINUED] lisinopril (PRINIVIL,ZESTRIL) 10 MG tablet Take 1 tablet (10 mg total) by mouth daily.   No facility-administered encounter medications on file as of 08/13/2020.    Current Diagnosis: Patient Active Problem List   Diagnosis Date Noted  . NSTEMI (non-ST elevated myocardial infarction) ([place]) 02/07/2020  . Bilateral lower extremity edema 04/05/2019  . Skin abnormalities 01/24/2018  . Ascending aortic aneurysm ([place]) 09/19/2017  . Ear pressure, right 08/17/2017  . Abnormal EKG 08/17/2017  . Hyperglycemia 08/15/2017  . Folliculitis 01/74/9449  . Angioedema 12/03/2016  . Sialadenitis 12/03/2016  . Neck pain 12/03/2016  . Hyperlipidemia 03/22/2016  . Psoriasis 02/10/2016  . Tobacco use disorder 11/26/2015  .  Uncontrolled hypertension 11/26/2015  . Morbid obesity ([place]) 11/26/2015    Goals Addressed   None     Follow-Up:  Pharmacist Review   Have you seen any other providers since your last visit? The patient states that he only tries to see Dr. [name] at this time but has also seen the PA from cardiology  Any changes in your medications or health? The patient states that he has not had any changes in his medications or health  Any side effects from any medications? The patient states that he has had some side effects for years but not sure if they are coming from medications or not, he mentioned stress.  Do you have an symptoms or problems not managed by your medications? The patient states that he does not have any symptoms or problems that are not managed by medications  Any concerns about your health right now? The patient states that he does not have any concerns about his health, he states that he feels fine  Has your provider asked that you check blood pressure, blood sugar, or follow special diet at home? The patient states that he does check his blood pressure occassionally but has not check in about a month  Do you get any type of exercise on a regular basis? The patient states that he does get much exercise  Can you think of a goal you would like to reach for your health? The patient states that his goal is  to get as health as he can  Do you have any problems getting your medications? The patient states that does not have any problems getting medications from the pharmacy, but may like delivery even though he doesn't mind picking them up. Also the cost of his meds each month   Is there anything that you would like to discuss during the appointment? The patient states that there is not anything specific he would like to discuss  Patient would like to receive a phone call asked to have medications and supplements at appointment    Wendy Poet, [place] [phone]   Time spent:30

## 2020-08-16 ENCOUNTER — Other Ambulatory Visit: Payer: Self-pay

## 2020-08-16 ENCOUNTER — Ambulatory Visit (INDEPENDENT_AMBULATORY_CARE_PROVIDER_SITE_OTHER): Payer: Medicare HMO | Admitting: Pharmacist

## 2020-08-16 DIAGNOSIS — I10 Essential (primary) hypertension: Secondary | ICD-10-CM | POA: Diagnosis not present

## 2020-08-16 DIAGNOSIS — I252 Old myocardial infarction: Secondary | ICD-10-CM

## 2020-08-16 DIAGNOSIS — F17200 Nicotine dependence, unspecified, uncomplicated: Secondary | ICD-10-CM

## 2020-08-16 DIAGNOSIS — E7849 Other hyperlipidemia: Secondary | ICD-10-CM | POA: Diagnosis not present

## 2020-08-16 MED ORDER — SPIRONOLACTONE 25 MG PO TABS: 25.0000 mg | ORAL_TABLET | Freq: Every day | ORAL | 2 refills | Status: DC

## 2020-08-16 MED ORDER — CARVEDILOL 3.125 MG PO TABS: 3.1250 mg | ORAL_TABLET | Freq: Two times a day (BID) | ORAL | 2 refills | Status: DC

## 2020-08-16 MED ORDER — TICAGRELOR 90 MG PO TABS: 90.0000 mg | ORAL_TABLET | Freq: Two times a day (BID) | ORAL | 2 refills | Status: DC

## 2020-08-16 MED ORDER — NITROGLYCERIN 0.4 MG SL SUBL: 0.4000 mg | SUBLINGUAL_TABLET | SUBLINGUAL | 7 refills | Status: DC | PRN

## 2020-08-16 MED ORDER — ATORVASTATIN CALCIUM 40 MG PO TABS: 80.0000 mg | ORAL_TABLET | Freq: Every day | ORAL | 2 refills | Status: DC

## 2020-08-16 MED ORDER — FUROSEMIDE 20 MG PO TABS: 20.0000 mg | ORAL_TABLET | Freq: Every day | ORAL | 2 refills | Status: DC

## 2020-08-16 NOTE — Patient Instructions (Addendum)
Visit Information  Phone number for Pharmacist: [phone]  Thank you for meeting with me to discuss your medications! I look forward to working with you to achieve your health care goals. Below is a summary of what we talked about during the visit:  Goals Addressed            This Visit's Progress   . Manage My Medicine       Timeframe:  Long-Range Goal Priority:  High Start Date:      08/16/20                       Expected End Date:        02/13/21               Follow Up Date 11/19/20   - call for medicine refill 2 or 3 days before it runs out - call if I am sick and can't take my medicine - keep a list of all the medicines I take; vitamins and herbals too - use a pillbox to sort medicine  -Utilize [facility] pharmacy for medication synchronization, packaging and delivery   Why is this important?   . These steps will help you keep on track with your medicines.   Notes:     . Stop or Cut Down Tobacco Use       Timeframe:  Long-Range Goal Priority:  High Start Date:      08/16/20                       Expected End Date:  02/13/21                     Follow Up Date 11/19/20   - change or avoid triggers like smoky places, drinking alcohol and other smokers - cut down number of cigarettes by one-half - drink 4-6 glasses of water each day    Why is this important?    To stop or cut down it is important to have support from a person or group of people who you can count on.   You will also need to think about the things that make you feel like smoking, then plan for how to handle them.    Notes:       Patient Care Plan: CCM Pharmacy Care Plan    Problem Identified: Hypertension, Hyperlipidemia, Coronary Artery Disease and Tobacco use   Priority: High    Long-Range Goal: Disease management   Start Date: 08/16/2020  Expected End Date: 02/13/2021  This Visit's Progress: On track  Priority: High  Note:   Current Barriers:  . Unable to independently monitor therapeutic  efficacy . Unable to self administer medications as prescribed . Does not contact provider office for questions/concerns  Pharmacist Clinical Goal(s):  Marland Kitchen Over the next 180 days, patient will achieve adherence to monitoring guidelines and medication adherence to achieve therapeutic efficacy . achieve ability to self administer medications as prescribed through use of [facility] pharmacy as evidenced by patient report . contact provider office for questions/concerns as evidenced notation of same in electronic health record through collaboration with PharmD and provider.   Interventions: . 1:1 collaboration with [name], MD regarding development and update of comprehensive plan of care as evidenced by provider attestation and co-signature . Inter-disciplinary care team collaboration (see longitudinal plan of care) . Comprehensive medication review performed; medication list updated in electronic medical record  Hypertension (BP goal <130/80) -  Not ideally controlled - pt ran out of spironolactone and carvedilol 2 days ago and due to confusion at pharmacy he has not refilled them  -Current treatment: . Amlodipine 10 mg daily  . Carvedilol 3.125 mg BID - failed to refill . Furosemide 20 mg daily . Spironolactone 25 mg daily - failed to refill -Medications previously tried: lisinopril (angioedema) -Current home readings: SBP 120-140 -Denies hypotensive/hypertensive symptoms -Educated on BP goals and benefits of medications for prevention of heart attack, stroke and kidney damage; Daily salt intake goal < 2300 mg; Importance of home blood pressure monitoring; -Counseled to monitor BP at home twice a week, document, and provide log at future appointments -Recommended to continue current medication  Hyperlipidemia / CAD (LDL goal < 70) -Controlled -Current treatment: . Atorvastatin 40 mg - 2 tab daily . Nitroglycerin 0.4 mg SL prn - never had to use . Aspirin 81 mg daily . Brilinta 90  mg BID  - planned end date 01/2021 -Educated on Cholesterol goals;  Benefits of statin for ASCVD risk reduction; longevity of nitroglycerin - advised to refill every 6 months -Recommended to continue current medication  Tobacco use (Goal: smoking cessation) -Uncontrolled - pt is smoking 1 ppd -Previous quit attempts: quit for 5 years once (cold [place]) -Patient triggers include: finishing a meal and going to a social event and seeing someone else smoke -reports MOTIVATION to quit is low -reports CONFIDENCE in quitting is low -Counseled on benefits of smoking cessation including reducing cardiac risk; pt is not ready to think about quitting -Recommended to continue to think about quitting  Health Maintenance  -Vaccine gaps: flu, pneumonia - pt is hesitant to get vaccines because he reports he always feels "crappy" for several days after. He also had NSTEMI 2 weeks after 2nd Covid vaccine which further added to vaccine hesitancy -Current therapy:  . Klor-Con 20 mEq daily -Patient is satisfied with current therapy and denies issues -Recommended to continue current medication  Patient Goals/Self-Care Activities . Over the next 90 days, patient will:  - take medications as prescribed focus on medication adherence by enhanced pharmacy services via [facility] check blood pressure twice a week, document, and provide at future appointments  Follow Up Plan: Telephone follow up appointment with care management team member scheduled for: 3 months      Mr. Connor Martinez was given information about Chronic Care Management services today including:  1. CCM service includes personalized support from designated clinical staff supervised by his physician, including individualized plan of care and coordination with other care providers 2. 24/7 contact phone numbers for assistance for urgent and routine care needs. 3. Standard insurance, coinsurance, copays and deductibles apply for chronic care management only  during months in which we provide at least 20 minutes of these services. Most insurances cover these services at 100%, however patients may be responsible for any copay, coinsurance and/or deductible if applicable. This service may help you avoid the need for more expensive face-to-face services. 4. Only one practitioner may furnish and bill the service in a calendar month. 5. The patient may stop CCM services at any time (effective at the end of the month) by phone call to the office staff.  Patient agreed to services and verbal consent obtained.   The patient verbalized understanding of instructions, educational materials, and care plan provided today and agreed to receive a mailed copy of patient instructions, educational materials, and care plan.  Telephone follow up appointment with pharmacy team member scheduled for: 3 months  Charlene Brooke, PharmD, BCACP Clinical Pharmacist [facility] [phone]  American Journal of Respiratory and [place], [phone]), e5-e31. [email or web address]">  Health Risks of Smoking Smoking tobacco is very bad for your health. Tobacco smoke contains many toxic chemicals that can damage every part of your body. Secondhand smoke can be harmful to those around you. Tobacco or nicotine use can cause many long-term (chronic) diseases. Smoking is difficult to quit because a chemical in tobacco, called nicotine, causes addiction or dependence. When you smoke and inhale, nicotine is absorbed quickly into the bloodstream through your lungs. Both inhaled and non-inhaled nicotine may be addictive. How can quitting affect me? There are health benefits of quitting smoking. Some benefits happen right away and others take time. Benefits may include:  Blood flow, blood pressure, heart rate, and lung capacity may begin to improve. However, any lung damage that has already occurred cannot be repaired.  Temporary  respiratory symptoms, such as nasal congestion and cough, may improve over time.  Your risk of heart disease, stroke, and cancer is reduced.  The overall quality of your health may improve.  You may save money, as you will not spend money on tobacco products and may spend less money on smoking-related health issues. What can increase my risk? Smoking harms nearly every organ in the body. People who smoke tobacco have a shorter life expectancy and an increased risk of many serious medical problems. These include:  More respiratory infections, such as colds and pneumonia.  Cancer.  Heart disease.  Stroke.  Chronic respiratory diseases.  Delayed wound healing and increased risk of complications during surgery.  Problems with reproduction, pregnancy, and childbirth, such as infertility, early (premature) births, stillbirths, and birth defects. Secondhand smoke exposure to children increases the risk of:  Sudden infant death syndrome (SIDS).  Infections in the nose, throat, or airways (respiratory infections).  Chronic respiratory symptoms.   What actions can I take to quit? Smoking is an addiction that affects both your body and your mind, and long-time habits can be hard to change. Your health care provider can recommend:  Nicotine replacement products, such as patches, gum, and nasal sprays. Use these products only as directed. Do not replace cigarette smoking with electronic cigarettes, which are commonly called e-cigarettes. The safety of e-cigarettes is not known, and some may contain harmful chemicals.  Programs and community resources, which may include group support, education, or talk therapy.  Prescription medicines to help reduce cravings.  A combination of two or more quit methods, which will increase the success of quitting.   Where to find support Follow the recommendations from your health care provider about support groups and other assistance. You can also  visit:  [employer]: www.naquitline.org or call 1-800-QUIT-NOW.  U.S. Department of Health and Human Services: www.smokefree.gov  American Lung Association: www.freedomfromsmoking.org  American Heart Association: www.heart.org Where to find more information  Centers for Disease Control and Prevention: http://www.wolf.info/  World Health Organization: RoleLink.com.br Summary  Smoking tobacco is very bad for your health. Tobacco smoke contains many toxic chemicals that can damage every part of the body.  Smoking is difficult to quit because a chemical in tobacco, called nicotine, causes addiction or dependence.  There are immediate and long-term health benefits of quitting smoking.  A combination of two or more quit methods increases the success of quitting. This information is not intended to replace advice given to you by your health care provider. Make sure you discuss any  questions you have with your health care provider. Document Revised: 07/24/2019 Document Reviewed: 07/24/2019 Elsevier Patient Education  2021 [employer].

## 2020-08-16 NOTE — Progress Notes (Signed)
Chronic Care Management Pharmacy Note  08/16/2020 Name:  Connor Martinez MRN:  [id number] DOB:  May 05, 1951  Subjective: Connor Martinez is an 70 y.o. year old male who is a primary patient of Burns, [name], MD.  The CCM team was consulted for assistance with disease management and care coordination needs.    Engaged with patient by telephone for initial visit in response to provider referral for pharmacy case management and/or care coordination services.   Consent to Services:  The patient was given the following information about Chronic Care Management services today, agreed to services, and gave verbal consent: 1. CCM service includes personalized support from designated clinical staff supervised by the primary care provider, including individualized plan of care and coordination with other care providers 2. 24/7 contact phone numbers for assistance for urgent and routine care needs. 3. Service will only be billed when office clinical staff spend 20 minutes or more in a month to coordinate care. 4. Only one practitioner may furnish and bill the service in a calendar month. 5.The patient may stop CCM services at any time (effective at the end of the month) by phone call to the office staff. 6. The patient will be responsible for cost sharing (co-pay) of up to 20% of the service fee (after annual deductible is met). Patient agreed to services and consent obtained.  Patient Care Team: [name], MD as PCP - General (Internal Medicine) Sherren Mocha, MD as PCP - Cardiology (Cardiology) Charlton Haws, [facility] as Pharmacist (Pharmacist)  Patient lives in an apartment with a roommate, he is moving to a house next month. He is retired, worked many different jobs in his life.  Recent office visits: 02/06/20 NP Jodi Mourning OV: chest pain, defers ER eval. Troponin was very elevated and pt contacted to go to ER.  10/25/19 Dr [name] OV: chronic f/u. Rx'd potassium for low K.  Recent consult  visits: 03/08/20 PA Richardson Dopp (cardiology): s/p NSTEMI 01/2020 w/ DES, HFrEF (EF 45-50% 01/2020). Plan for DAPT x 12 mos (end 01/2021). ARB/Entresto contraindicated d/t hx angioedema w/ ACE.  Hospital visits: 02/07/20-02/10/20 hospital admission for NSTEMI - PCI w/ DES. Discharged on amlodipine, atorvastatin, carvedilol, aspirin, Brilinta, spironolactone, furosemide, potassium, NTG  Objective:  Lab Results  Component Value Date   CREATININE 0.80 06/12/2020   BUN 12 04/19/2020   GFR 105.21 02/06/2020   GFRNONAA 99 04/19/2020   GFRAA 115 04/19/2020   NA 141 04/19/2020   K 4.4 04/19/2020   CALCIUM 9.0 04/19/2020   CO2 26 04/19/2020    Lab Results  Component Value Date/Time   HGBA1C 5.3 02/07/2020 08:42 PM   HGBA1C 5.4 10/25/2019 02:42 PM   GFR 105.21 02/06/2020 01:15 PM   GFR 131.60 10/25/2019 02:42 PM    Last diabetic Eye exam: No results found for: HMDIABEYEEXA  Last diabetic Foot exam: No results found for: HMDIABFOOTEX   Lab Results  Component Value Date   CHOL 128 04/19/2020   HDL 44 04/19/2020   LDLCALC 68 04/19/2020   TRIG 83 04/19/2020   CHOLHDL 2.9 04/19/2020    Hepatic Function Latest Ref Rng & Units 04/19/2020 02/06/2020 10/25/2019  Total Protein 6.0 - 8.5 g/dL 6.8 6.9 7.1  Albumin 3.8 - 4.8 g/dL 4.2 3.9 4.1  AST 0 - 40 IU/L 14 36 13  ALT 0 - 44 IU/L _0 Alk Phosphatase 44 - 121 IU/L 99 71 85  Total Bilirubin 0.0 - 1.2 mg/dL 0.2 0.6 0.5  Lab Results  Component Value Date/Time   TSH 2.083 02/07/2020 08:43 PM   TSH 1.19 07/27/2018 11:58 AM   TSH 0.77 03/18/2016 04:59 PM    CBC Latest Ref Rng & Units 02/10/2020 02/09/2020 02/08/2020  WBC 4.0 - 10.5 K/uL 8.6 8.3 7.8  Hemoglobin 13.0 - 17.0 g/dL 13.1 13.1 13.1  Hematocrit 39.0 - 52.0 % 39.9 39.5 40.6  Platelets 150 - 400 K/uL 231 211 197    No results found for: VD25OH  Clinical ASCVD: Yes  The ASCVD Risk score Mikey Bussing DC Jr., et al., 2013) failed to calculate for the following reasons:   The patient  has a prior MI or stroke diagnosis    Depression screen [facility] 2/9 10/25/2019 04/05/2019  Decreased Interest 0 0  Down, Depressed, Hopeless 0 0  PHQ - 2 Score 0 0     Social History   Tobacco Use  Smoking Status Current Every Day Smoker  Smokeless Tobacco Never Used   BP Readings from Last 3 Encounters:  03/08/20 120/60  02/10/20 (!) 160/89  02/06/20 116/62   Pulse Readings from Last 3 Encounters:  03/08/20 75  02/10/20 (!) 59  02/06/20 67   Wt Readings from Last 3 Encounters:  03/08/20 205 lb (93 kg)  02/10/20 209 lb 4.8 oz (94.9 kg)  02/06/20 211 lb (95.7 kg)    Assessment/Interventions: Review of patient past medical history, allergies, medications, health status, including review of consultants reports, laboratory and other test data, was performed as part of comprehensive evaluation and provision of chronic care management services.   SDOH:  (Social Determinants of Health) assessments and interventions performed: Yes SDOH Interventions   Flowsheet Row Most Recent Value  SDOH Interventions   Financial Strain Interventions Intervention Not Indicated      CCM Care Plan  Allergies  Allergen Reactions  . Ace Inhibitors Swelling    angioedema  . Penicillins Rash    Medications Reviewed Today    Reviewed by Charlton Haws, [facility] (Pharmacist) on 08/16/20 at [phone]  Med List Status: <None>  Medication Order Taking? Sig Documenting Provider Last Dose Status Informant  amLODipine (NORVASC) 10 MG tablet [id number] Yes TAKE 1 TABLET BY MOUTH EVERY DAY Burns, [name], MD Taking Active   aspirin EC 81 MG tablet [id number] Yes Take 1 tablet (81 mg total) by mouth daily. Swallow whole. [place], Crista Luria, PA Taking Active   atorvastatin (LIPITOR) 40 MG tablet [id number] Yes TAKE 2 TABLETS BY MOUTH EVERY DAY Bhagat, Bhavinkumar, PA Taking Active   carvedilol (COREG) 3.125 MG tablet [id number] Yes TAKE 1 TABLET (3.125 MG TOTAL) BY MOUTH 2 (TWO) TIMES DAILY WITH A MEAL. Leanor Kail, PA Taking Active   furosemide (LASIX) 20 MG tablet [id number] Yes Take 1 tablet (20 mg total) by mouth daily. Leanor Kail, PA Taking Active   KLOR-CON M20 20 MEQ tablet [id number] Yes TAKE 1 TABLET BY MOUTH EVERY DAY [name], MD Taking Active         Discontinued 03/23/16 1006   nitroGLYCERIN (NITROSTAT) 0.4 MG SL tablet [id number] Yes Place 1 tablet (0.4 mg total) under the tongue every 5 (five) minutes x 3 doses as needed for chest pain. [place], Crista Luria, PA Taking Active   spironolactone (ALDACTONE) 25 MG tablet [id number] Yes TAKE 1 TABLET BY MOUTH EVERY DAY Sherren Mocha, MD Taking Active   ticagrelor (BRILINTA) 90 MG TABS tablet [id number] Yes Take 1 tablet (90 mg total) by mouth 2 (two) times daily. Sherren Mocha, MD Taking Active  Patient Active Problem List   Diagnosis Date Noted  . NSTEMI (non-ST elevated myocardial infarction) ([place]) 02/07/2020  . Bilateral lower extremity edema 04/05/2019  . Skin abnormalities 01/24/2018  . Ascending aortic aneurysm ([place]) 09/19/2017  . Ear pressure, right 08/17/2017  . Abnormal EKG 08/17/2017  . Hyperglycemia 08/15/2017  . Folliculitis 17/49/4496  . Angioedema 12/03/2016  . Sialadenitis 12/03/2016  . Neck pain 12/03/2016  . Hyperlipidemia 03/22/2016  . Psoriasis 02/10/2016  . Tobacco use disorder 11/26/2015  . Uncontrolled hypertension 11/26/2015  . Morbid obesity ([place]) 11/26/2015    Immunization History  Administered Date(s) Administered  . Tdap 04/10/2011  . Zoster Recombinat (Shingrix) 12/01/2012    Conditions to be addressed/monitored:  Hypertension, Hyperlipidemia, Coronary Artery Disease and Tobacco use  Care Plan : [place]  Updates made by Charlton Haws, RPH since 08/16/2020 12:00 AM    Problem: Hypertension, Hyperlipidemia, Coronary Artery Disease and Tobacco use   Priority: High    Long-Range Goal: Disease management   Start Date: 08/16/2020  Expected End  Date: 02/13/2021  This Visit's Progress: On track  Priority: High  Note:   Current Barriers:  . Unable to independently monitor therapeutic efficacy . Unable to self administer medications as prescribed . Does not contact provider office for questions/concerns  Pharmacist Clinical Goal(s):  Marland Kitchen Over the next 180 days, patient will achieve adherence to monitoring guidelines and medication adherence to achieve therapeutic efficacy . achieve ability to self administer medications as prescribed through use of [facility] as evidenced by patient report . contact provider office for questions/concerns as evidenced notation of same in electronic health record through collaboration with PharmD and provider.   Interventions: . 1:1 collaboration with [name], MD regarding development and update of comprehensive plan of care as evidenced by provider attestation and co-signature . Inter-disciplinary care team collaboration (see longitudinal plan of care) . Comprehensive medication review performed; medication list updated in electronic medical record  Hypertension (BP goal <130/80) -Not ideally controlled - pt ran out of spironolactone and carvedilol 2 days ago and due to confusion at pharmacy he has not refilled them  -Current treatment: . Amlodipine 10 mg daily  . Carvedilol 3.125 mg BID - failed to refill . Furosemide 20 mg daily . Spironolactone 25 mg daily - failed to refill -Medications previously tried: lisinopril (angioedema) -Current home readings: SBP 120-140 -Denies hypotensive/hypertensive symptoms -Educated on BP goals and benefits of medications for prevention of heart attack, stroke and kidney damage; Daily salt intake goal < 2300 mg; Importance of home blood pressure monitoring; -Counseled to monitor BP at home twice a week, document, and provide log at future appointments -Recommended to continue current medication  Hyperlipidemia / CAD (LDL goal <  70) -Controlled -Current treatment: . Atorvastatin 40 mg - 2 tab daily . Nitroglycerin 0.4 mg SL prn - never had to use . Aspirin 81 mg daily . Brilinta 90 mg BID  - planned end date 01/2021 -Educated on Cholesterol goals;  Benefits of statin for ASCVD risk reduction; longevity of nitroglycerin - advised to refill every 6 months -Recommended to continue current medication  Tobacco use (Goal: smoking cessation) -Uncontrolled - pt is smoking 1 ppd -Previous quit attempts: quit for 5 years once (cold [place]) -Patient triggers include: finishing a meal and going to a social event and seeing someone else smoke -reports MOTIVATION to quit is low -reports CONFIDENCE in quitting is low -Counseled on benefits of smoking cessation including reducing cardiac risk; pt is  not ready to think about quitting -Recommended to continue to think about quitting  Health Maintenance  -Vaccine gaps: flu, pneumonia - pt is hesitant to get vaccines because he reports he always feels "crappy" for several days after. He also had NSTEMI 2 weeks after 2nd Covid vaccine which further added to vaccine hesitancy -Current therapy:  . Klor-Con 20 mEq daily -Patient is satisfied with current therapy and denies issues -Recommended to continue current medication  Patient Goals/Self-Care Activities . Over the next 90 days, patient will:  - take medications as prescribed focus on medication adherence by enhanced pharmacy services via [facility] check blood pressure twice a week, document, and provide at future appointments  Follow Up Plan: Telephone follow up appointment with care management team member scheduled for: 3 months      Medication Assistance: None required.  Patient affirms current coverage meets needs.  Patient's preferred pharmacy is:  [id number] [place] [id number]: [phone]:  [phone] Uses pill box? No - prefers bottles Pt endorses 100% compliance  We discussed: Verbal consent obtained for [facility] enhanced pharmacy services (medication synchronization, adherence packaging, delivery coordination). A medication sync plan was created to allow patient to get all medications delivered once every 30 to 90 days per patient preference. Patient understands they have freedom to choose pharmacy and clinical pharmacist will coordinate care between all prescribers and [facility].  Patient decided to: Utilize [facility] for medication synchronization, packaging and delivery  Care Plan and Follow Up Patient Decision:  Patient agrees to Care Plan and Follow-up.  Plan: Telephone follow up appointment with care management team member scheduled for:  3 months  LCharlene Brooke PharmD, BGreenbrier Valley Medical CenterClinical Pharmacist LFreeburnPrimary Care at [phone]

## 2020-08-19 ENCOUNTER — Telehealth: Payer: Self-pay | Admitting: Pharmacist

## 2020-08-19 NOTE — Progress Notes (Signed)
    Chronic Care Management Pharmacy Assistant   Name: Connor Martinez  MRN: [id number] DOB: 11/22/1950  Reason for Encounter: Chart Review   PCP : [name], MD  Allergies:   Allergies  Allergen Reactions  . Ace Inhibitors Swelling    angioedema  . Penicillins Rash    Medications: Outpatient Encounter Medications as of 08/19/2020  Medication Sig  . amLODipine (NORVASC) 10 MG tablet TAKE 1 TABLET BY MOUTH EVERY DAY  . aspirin EC 81 MG tablet Take 1 tablet (81 mg total) by mouth daily. Swallow whole.  Marland Kitchen atorvastatin (LIPITOR) 40 MG tablet Take 2 tablets (80 mg total) by mouth daily.  . carvedilol (COREG) 3.125 MG tablet Take 1 tablet (3.125 mg total) by mouth 2 (two) times daily with a meal.  . furosemide (LASIX) 20 MG tablet Take 1 tablet (20 mg total) by mouth daily.  Marland Kitchen KLOR-CON M20 20 MEQ tablet TAKE 1 TABLET BY MOUTH EVERY DAY  . nitroGLYCERIN (NITROSTAT) 0.4 MG SL tablet Place 1 tablet (0.4 mg total) under the tongue every 5 (five) minutes x 3 doses as needed for chest pain.  Marland Kitchen spironolactone (ALDACTONE) 25 MG tablet Take 1 tablet (25 mg total) by mouth daily.  . ticagrelor (BRILINTA) 90 MG TABS tablet Take 1 tablet (90 mg total) by mouth 2 (two) times daily.  . [DISCONTINUED] lisinopril (PRINIVIL,ZESTRIL) 10 MG tablet Take 1 tablet (10 mg total) by mouth daily.   No facility-administered encounter medications on file as of 08/19/2020.    Current Diagnosis: Patient Active Problem List   Diagnosis Date Noted  . NSTEMI (non-ST elevated myocardial infarction) ([place]) 02/07/2020  . Bilateral lower extremity edema 04/05/2019  . Skin abnormalities 01/24/2018  . Ascending aortic aneurysm ([place]) 09/19/2017  . Ear pressure, right 08/17/2017  . Abnormal EKG 08/17/2017  . Hyperglycemia 08/15/2017  . Folliculitis 61/44/3154  . Angioedema 12/03/2016  . Sialadenitis 12/03/2016  . Neck pain 12/03/2016  . Hyperlipidemia 03/22/2016  . Psoriasis 02/10/2016  . Tobacco use disorder  11/26/2015  . Uncontrolled hypertension 11/26/2015  . Morbid obesity ([place]) 11/26/2015    Goals Addressed   None     Follow-Up:  Pharmacist Review   Reviewed chart for medication changes and adherence.  No ov, consults or hospital visits since last care coordination call with clinical pharmacist No medication changes indicated  No gaps in adherence identified. Patient has follow up scheduled with pharmacy team. No further action required.   Wendy Poet, [place] [phone]

## 2020-08-21 ENCOUNTER — Other Ambulatory Visit: Payer: Self-pay

## 2020-08-21 MED ORDER — POTASSIUM CHLORIDE CRYS ER 20 MEQ PO TBCR: 20.0000 meq | EXTENDED_RELEASE_TABLET | Freq: Every day | ORAL | 1 refills | Status: DC

## 2020-08-21 MED ORDER — AMLODIPINE BESYLATE 10 MG PO TABS: 10.0000 mg | ORAL_TABLET | Freq: Every day | ORAL | 1 refills | Status: DC

## 2020-10-14 NOTE — Progress Notes (Signed)
Subjective:    Patient ID: Connor Martinez, male    DOB: 1951-05-03, 70 y.o.   MRN: [id number]  HPI The patient is here for an acute visit.   Swelling in breasts - he noticed breast swelling x 2 months at least.  They are sensitive if he touches them.  He is worried about having cancer.     He is working on losing weight and eating properly.    He is smoking 1-1 1/2 packs per day.  He is trying to cut back.   He is taking all of his medications as prescribed.     Medications and allergies reviewed with patient and updated if appropriate.  Patient Active Problem List   Diagnosis Date Noted  . Gynecomastia 10/15/2020  . NSTEMI (non-ST elevated myocardial infarction) ([place]) 02/07/2020  . Bilateral lower extremity edema 04/05/2019  . Skin abnormalities 01/24/2018  . Ascending aortic aneurysm ([place]) 09/19/2017  . Ear pressure, right 08/17/2017  . Abnormal EKG 08/17/2017  . Hyperglycemia 08/15/2017  . Folliculitis 42/70/6237  . Angioedema 12/03/2016  . Sialadenitis 12/03/2016  . Neck pain 12/03/2016  . Hyperlipidemia 03/22/2016  . Psoriasis 02/10/2016  . Tobacco use disorder 11/26/2015  . Hypertension 11/26/2015  . Morbid obesity ([place]) 11/26/2015    Current Outpatient Medications on File Prior to Visit  Medication Sig Dispense Refill  . amLODipine (NORVASC) 10 MG tablet Take 1 tablet (10 mg total) by mouth daily. 90 tablet 1  . aspirin EC 81 MG tablet Take 1 tablet (81 mg total) by mouth daily. Swallow whole. 30 tablet 11  . atorvastatin (LIPITOR) 40 MG tablet Take 2 tablets (80 mg total) by mouth daily. 180 tablet 2  . furosemide (LASIX) 20 MG tablet Take 1 tablet (20 mg total) by mouth daily. 90 tablet 2  . nitroGLYCERIN (NITROSTAT) 0.4 MG SL tablet Place 1 tablet (0.4 mg total) under the tongue every 5 (five) minutes x 3 doses as needed for chest pain. 25 tablet 7  . potassium chloride SA (KLOR-CON M20) 20 MEQ tablet Take 1 tablet (20 mEq total) by mouth daily. 90 tablet  1  . ticagrelor (BRILINTA) 90 MG TABS tablet Take 1 tablet (90 mg total) by mouth 2 (two) times daily. 180 tablet 2  . [DISCONTINUED] lisinopril (PRINIVIL,ZESTRIL) 10 MG tablet Take 1 tablet (10 mg total) by mouth daily. 90 tablet 3   No current facility-administered medications on file prior to visit.    Past Medical History:  Diagnosis Date  . Angioedema   . BPV (benign positional vertigo)   . Hemorrhoids   . Hyperlipidemia   . 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 BACK SURGERY    . CORONARY STENT INTERVENTION N/A 02/08/2020   Procedure: CORONARY STENT INTERVENTION;  Surgeon: Lorretta Harp, MD;  Location: [place] [facility];  Service: Cardiovascular;  Laterality: N/A;  RCA and CIRCULFLEX  . LEFT HEART CATH AND CORONARY ANGIOGRAPHY N/A 02/08/2020   Procedure: LEFT HEART CATH AND CORONARY ANGIOGRAPHY;  Surgeon: Lorretta Harp, MD;  Location: [place] [facility];  Service: Cardiovascular;  Laterality: N/A;  . SHOULDER SURGERY     Right    Social History   Socioeconomic History  . Marital status: Divorced    Spouse name: Not on file  . Number of children: Not on file  . Years of education: Not on  file  . Highest education level: Not on file  Occupational History  . Not on file  Tobacco Use  . Smoking status: Current Every Day Smoker  . Smokeless tobacco: Never Used  Substance and Sexual Activity  . Alcohol use: Yes    Comment: infrequent  . Drug use: Not Currently    Types: Marijuana    Comment: occasional  . Sexual activity: Not on file  Other Topics Concern  . Not on file  Social History Narrative  . Not on file   Social Determinants of Health   Financial Resource Strain: Low Risk   . Difficulty of Paying Living Expenses: Not very hard  Food Insecurity: Not on file  Transportation Needs: Not on file  Physical Activity: Not on file  Stress: Not on file   Social Connections: Not on file    Family History  Problem Relation Age of Onset  . Stroke Mother   . Hypertension Mother   . Heart disease Mother   . Hepatitis C Brother   . Hypertension Brother     Review of Systems  Constitutional: Negative for chills and fever.  Respiratory: Positive for cough (from smoking) and shortness of breath (from smoking). Negative for wheezing.   Cardiovascular: Negative for chest pain, palpitations and leg swelling.  Neurological: Negative for light-headedness and headaches.       Objective:   Vitals:   10/15/20 1430  BP: 124/72  Pulse: 67  Temp: 98.3 F (36.8 C)  SpO2: 97%   BP Readings from Last 3 Encounters:  10/15/20 124/72  03/08/20 120/60  02/10/20 (!) 160/89   Wt Readings from Last 3 Encounters:  10/15/20 214 lb (97.1 kg)  03/08/20 205 lb (93 kg)  02/10/20 209 lb 4.8 oz (94.9 kg)   Body mass index is 35.07 kg/m.   Physical Exam    Constitutional: Appears well-developed and well-nourished. No distress.  Head: Normocephalic and atraumatic.  Neck: Neck supple. No tracheal deviation present. No thyromegaly present.  No cervical lymphadenopathy Cardiovascular: Normal rate, regular rhythm and normal heart sounds.  No murmur heard. No carotid bruit .  No edema Chest:  B/l gynecomastia - slight breast enlargement and tendernes with palpation Pulmonary: Effort normal and breath sounds normal. No respiratory distress. No has no wheezes. No rales.  Skin: Skin is warm and dry. Not diaphoretic.  Psychiatric: Normal mood and affect. Behavior is normal.       Assessment & Plan:    He is overdue for follow up with Cardiology - advised follow up   See Problem List for Assessment and Plan of chronic medical problems.    This visit occurred during the SARS-CoV-2 public health emergency.  Safety protocols were in place, including screening questions prior to the visit, additional usage of staff PPE, and extensive cleaning of exam  room while observing appropriate contact time as indicated for disinfecting solutions.

## 2020-10-15 ENCOUNTER — Ambulatory Visit (INDEPENDENT_AMBULATORY_CARE_PROVIDER_SITE_OTHER): Payer: Medicare HMO | Admitting: Internal Medicine

## 2020-10-15 ENCOUNTER — Other Ambulatory Visit: Payer: Self-pay

## 2020-10-15 ENCOUNTER — Encounter: Payer: Self-pay | Admitting: Internal Medicine

## 2020-10-15 DIAGNOSIS — I10 Essential (primary) hypertension: Secondary | ICD-10-CM | POA: Diagnosis not present

## 2020-10-15 DIAGNOSIS — N62 Hypertrophy of breast: Secondary | ICD-10-CM | POA: Diagnosis not present

## 2020-10-15 MED ORDER — CARVEDILOL 6.25 MG PO TABS: 6.2500 mg | ORAL_TABLET | Freq: Two times a day (BID) | ORAL | 1 refills | Status: DC

## 2020-10-15 NOTE — Assessment & Plan Note (Signed)
Acute Stop spironolactone  Return if symptoms do not resolve

## 2020-10-15 NOTE — Assessment & Plan Note (Signed)
Chronic Well controlled today Stop spironolactone due to gynecomastia Will increase coreg to keep BP controlled - advised him to monitor HR Continue amlodipine 10 mg daily and lasix 20 mg daily

## 2020-10-15 NOTE — Patient Instructions (Addendum)
Stop spironolactone.    Increase coreg to 6.25 mg twice daily.    Monitor your BP and heart rate.    Follow up in 6 months

## 2020-10-25 ENCOUNTER — Telehealth: Payer: Self-pay | Admitting: Pharmacist

## 2020-10-28 ENCOUNTER — Telehealth: Payer: Self-pay | Admitting: Internal Medicine

## 2020-10-28 NOTE — Telephone Encounter (Signed)
Patient called and had some questions and concerns about his medication. He is requesting a call back and can be reached at [phone]. Please advise

## 2020-10-29 ENCOUNTER — Other Ambulatory Visit: Payer: Self-pay

## 2020-10-29 MED ORDER — CARVEDILOL 6.25 MG PO TABS: 6.2500 mg | ORAL_TABLET | Freq: Two times a day (BID) | ORAL | 1 refills | Status: DC

## 2020-10-29 NOTE — Telephone Encounter (Signed)
Patient calling back requesting a call back

## 2020-10-29 NOTE — Telephone Encounter (Signed)
Spoke with pharmacy, they will return and refund patient for spironolactone and d/c med from his profile. Patient aware.

## 2020-10-31 NOTE — Telephone Encounter (Cosign Needed)
    Chronic Care Management Pharmacy Assistant   Name: Connor Martinez  MRN: [id number] DOB: 10-Aug-1950  Called patient 10/25/20 to go over medication coordination call for medication delivery. Unable to reach patient. Patient called the pharmacy for medication review, meds were delivered.   [place] Pharmacist Assistant [phone]  Time spent:9

## 2020-11-13 ENCOUNTER — Telehealth: Payer: Self-pay | Admitting: Pharmacist

## 2020-11-13 ENCOUNTER — Telehealth: Payer: Medicare HMO

## 2020-11-13 NOTE — Telephone Encounter (Signed)
  Chronic Care Management   Outreach Note  11/13/2020 Name: Connor Martinez MRN: [id number] DOB: 11/14/1950  Referred by: [name], MD  Patient had a phone appointment scheduled with clinical pharmacist today.  An unsuccessful telephone outreach was attempted today. The patient was referred to the pharmacist for assistance with care management and care coordination.   If possible, a message was left to return call to clinical pharmacist [phone]) or to [place] [phone])   Charlene Brooke, PharmD, [place], CPP Clinical Pharmacist [place] [facility] at [facility] [phone]

## 2020-11-13 NOTE — Progress Notes (Deleted)
Chronic Care Management Pharmacy Note  11/13/2020 Name:  Connor Martinez MRN:  [id number] DOB:  Feb 27, 1951  Subjective: Connor Martinez is an 70 y.o. year old male who is a primary patient of Burns, [name], MD.  The CCM team was consulted for assistance with disease management and care coordination needs.    Engaged with patient by telephone for follow up visit in response to provider referral for pharmacy case management and/or care coordination services.   Consent to Services:  The patient was given information about Chronic Care Management services, agreed to services, and gave verbal consent prior to initiation of services.  Please see initial visit note for detailed documentation.   Patient Care Team: [name], MD as PCP - General (Internal Medicine) Sherren Mocha, MD as PCP - Cardiology (Cardiology) Charlton Haws, [facility] as Pharmacist (Pharmacist)  Patient lives in an apartment with a roommate, he is moving to a house next month. He is retired, worked many different jobs in his life.  Recent office visits: 10/15/20 Dr [name] OV: acute visit - swelling in breasts. Stop spironolactone. Increase carvedilol to 6.25 mg BID.  02/06/20 NP Jodi Mourning OV: chest pain, defers ER eval. Troponin was very elevated and pt contacted to go to ER.  10/25/19 Dr [name] OV: chronic f/u. Rx'd potassium for low K.  Recent consult visits: 03/08/20 PA Richardson Dopp (cardiology): s/p NSTEMI 01/2020 w/ DES, HFrEF (EF 45-50% 01/2020). Plan for DAPT x 12 mos (end 01/2021). ARB/Entresto contraindicated d/t hx angioedema w/ ACE.  Hospital visits: 02/07/20-02/10/20 hospital admission for NSTEMI - PCI w/ DES. Discharged on amlodipine, atorvastatin, carvedilol, aspirin, Brilinta, spironolactone, furosemide, potassium, NTG  Objective:  Lab Results  Component Value Date   CREATININE 0.80 06/12/2020   BUN 12 04/19/2020   GFR 105.21 02/06/2020   GFRNONAA 99 04/19/2020   GFRAA 115 04/19/2020   NA 141  04/19/2020   K 4.4 04/19/2020   CALCIUM 9.0 04/19/2020   CO2 26 04/19/2020    Lab Results  Component Value Date/Time   HGBA1C 5.3 02/07/2020 08:42 PM   HGBA1C 5.4 10/25/2019 02:42 PM   GFR 105.21 02/06/2020 01:15 PM   GFR 131.60 10/25/2019 02:42 PM    Last diabetic Eye exam: No results found for: HMDIABEYEEXA  Last diabetic Foot exam: No results found for: HMDIABFOOTEX   Lab Results  Component Value Date   CHOL 128 04/19/2020   HDL 44 04/19/2020   LDLCALC 68 04/19/2020   TRIG 83 04/19/2020   CHOLHDL 2.9 04/19/2020    Hepatic Function Latest Ref Rng & Units 04/19/2020 02/06/2020 10/25/2019  Total Protein 6.0 - 8.5 g/dL 6.8 6.9 7.1  Albumin 3.8 - 4.8 g/dL 4.2 3.9 4.1  AST 0 - 40 IU/L 14 36 13  ALT 0 - 44 IU/L '16 16 14  ' Alk Phosphatase 44 - 121 IU/L 99 71 85  Total Bilirubin 0.0 - 1.2 mg/dL 0.2 0.6 0.5    Lab Results  Component Value Date/Time   TSH 2.083 02/07/2020 08:43 PM   TSH 1.19 07/27/2018 11:58 AM   TSH 0.77 03/18/2016 04:59 PM    CBC Latest Ref Rng & Units 02/10/2020 02/09/2020 02/08/2020  WBC 4.0 - 10.5 K/uL 8.6 8.3 7.8  Hemoglobin 13.0 - 17.0 g/dL 13.1 13.1 13.1  Hematocrit 39.0 - 52.0 % 39.9 39.5 40.6  Platelets 150 - 400 K/uL 231 211 197    No results found for: VD25OH  Clinical ASCVD: Yes  The ASCVD Risk score Mikey Bussing  DC Jr., et al., 2013) failed to calculate for the following reasons:   The patient has a prior MI or stroke diagnosis    Depression screen [facility] 2/9 10/25/2019 04/05/2019  Decreased Interest 0 0  Down, Depressed, Hopeless 0 0  PHQ - 2 Score 0 0     Social History   Tobacco Use  Smoking Status Current Every Day Smoker  Smokeless Tobacco Never Used   BP Readings from Last 3 Encounters:  10/15/20 124/72  03/08/20 120/60  02/10/20 (!) 160/89   Pulse Readings from Last 3 Encounters:  10/15/20 67  03/08/20 75  02/10/20 (!) 59   Wt Readings from Last 3 Encounters:  10/15/20 214 lb (97.1 kg)  03/08/20 205 lb (93 kg)  02/10/20 209 lb  4.8 oz (94.9 kg)    Assessment/Interventions: Review of patient past medical history, allergies, medications, health status, including review of consultants reports, laboratory and other test data, was performed as part of comprehensive evaluation and provision of chronic care management services.   SDOH:  (Social Determinants of Health) assessments and interventions performed: Yes   CCM Care Plan  Allergies  Allergen Reactions  . Ace Inhibitors Swelling    angioedema  . Penicillins Rash    Medications Reviewed Today    Reviewed by [name], MD (Physician) on 10/15/20 at 1437  Med List Status: <None>  Medication Order Taking? Sig Documenting Provider Last Dose Status Informant  amLODipine (NORVASC) 10 MG tablet [id number] Yes Take 1 tablet (10 mg total) by mouth daily. [name], MD Taking Active   aspirin EC 81 MG tablet [id number] Yes Take 1 tablet (81 mg total) by mouth daily. Swallow whole. Leanor Kail, PA Taking Active   atorvastatin (LIPITOR) 40 MG tablet [id number] Yes Take 2 tablets (80 mg total) by mouth daily. Sherren Mocha, MD Taking Active   carvedilol (COREG) 3.125 MG tablet [id number] Yes Take 1 tablet (3.125 mg total) by mouth 2 (two) times daily with a meal. Sherren Mocha, MD Taking Active   furosemide (LASIX) 20 MG tablet [id number] Yes Take 1 tablet (20 mg total) by mouth daily. Sherren Mocha, MD Taking Active         Discontinued 03/23/16 1006   nitroGLYCERIN (NITROSTAT) 0.4 MG SL tablet [id number] Yes Place 1 tablet (0.4 mg total) under the tongue every 5 (five) minutes x 3 doses as needed for chest pain. Sherren Mocha, MD Taking Active   potassium chloride SA (KLOR-CON M20) 20 MEQ tablet [id number] Yes Take 1 tablet (20 mEq total) by mouth daily. [name], MD Taking Active   spironolactone (ALDACTONE) 25 MG tablet [id number] Yes Take 1 tablet (25 mg total) by mouth daily. Sherren Mocha, MD Taking Active   ticagrelor [facility]) 90 MG TABS  tablet [id number] Yes Take 1 tablet (90 mg total) by mouth 2 (two) times daily. Sherren Mocha, MD Taking Active           Patient Active Problem List   Diagnosis Date Noted  . Gynecomastia 10/15/2020  . NSTEMI (non-ST elevated myocardial infarction) ([place]) 02/07/2020  . Bilateral lower extremity edema 04/05/2019  . Skin abnormalities 01/24/2018  . Ascending aortic aneurysm ([place]) 09/19/2017  . Ear pressure, right 08/17/2017  . Abnormal EKG 08/17/2017  . Hyperglycemia 08/15/2017  . Folliculitis 81/27/5170  . Angioedema 12/03/2016  . Sialadenitis 12/03/2016  . Neck pain 12/03/2016  . Hyperlipidemia 03/22/2016  . Psoriasis 02/10/2016  . Tobacco use disorder 11/26/2015  . Hypertension 11/26/2015  . Morbid obesity (  [place]) 11/26/2015    Immunization History  Administered Date(s) Administered  . Tdap 04/10/2011  . Zoster Recombinat (Shingrix) 12/01/2012    Conditions to be addressed/monitored:  Hypertension, Hyperlipidemia, Coronary Artery Disease and Tobacco use  Patient Care Plan: CCM Pharmacy Care Plan    Problem Identified: Hypertension, Hyperlipidemia, Coronary Artery Disease and Tobacco use   Priority: High    Long-Range Goal: Disease management   Start Date: 08/16/2020  Expected End Date: 02/13/2021  This Visit's Progress: On track  Priority: High  Note:   Current Barriers:  . Unable to independently monitor therapeutic efficacy . Unable to self administer medications as prescribed . Does not contact provider office for questions/concerns  Pharmacist Clinical Goal(s):  Marland Kitchen Over the next 180 days, patient will achieve adherence to monitoring guidelines and medication adherence to achieve therapeutic efficacy . achieve ability to self administer medications as prescribed through use of [facility] as evidenced by patient report . contact provider office for questions/concerns as evidenced notation of same in electronic health record through collaboration with  PharmD and provider.   Interventions: . 1:1 collaboration with [name], MD regarding development and update of comprehensive plan of care as evidenced by provider attestation and co-signature . Inter-disciplinary care team collaboration (see longitudinal plan of care) . Comprehensive medication review performed; medication list updated in electronic medical record  Hypertension (BP goal <130/80) -Not ideally controlled - pt ran out of spironolactone and carvedilol 2 days ago and due to confusion at pharmacy he has not refilled them  -Current treatment: . Amlodipine 10 mg daily  . Carvedilol 3.125 mg BID - failed to refill . Furosemide 20 mg daily . Spironolactone 25 mg daily - failed to refill -Medications previously tried: lisinopril (angioedema) -Current home readings: SBP 120-140 -Denies hypotensive/hypertensive symptoms -Educated on BP goals and benefits of medications for prevention of heart attack, stroke and kidney damage; Daily salt intake goal < 2300 mg; Importance of home blood pressure monitoring; -Counseled to monitor BP at home twice a week, document, and provide log at future appointments -Recommended to continue current medication  Hyperlipidemia / CAD (LDL goal < 70) -Controlled -Current treatment: . Atorvastatin 40 mg - 2 tab daily . Nitroglycerin 0.4 mg SL prn - never had to use . Aspirin 81 mg daily . Brilinta 90 mg BID  - planned end date 01/2021 -Educated on Cholesterol goals;  Benefits of statin for ASCVD risk reduction; longevity of nitroglycerin - advised to refill every 6 months -Recommended to continue current medication  Tobacco use (Goal: smoking cessation) -Uncontrolled - pt is smoking 1 ppd -Previous quit attempts: quit for 5 years once (cold [place]) -Patient triggers include: finishing a meal and going to a social event and seeing someone else smoke -reports MOTIVATION to quit is low -reports CONFIDENCE in quitting is low -Counseled on  benefits of smoking cessation including reducing cardiac risk; pt is not ready to think about quitting -Recommended to continue to think about quitting  Health Maintenance  -Vaccine gaps: flu, pneumonia - pt is hesitant to get vaccines because he reports he always feels "crappy" for several days after. He also had NSTEMI 2 weeks after 2nd Covid vaccine which further added to vaccine hesitancy -Current therapy:  . Klor-Con 20 mEq daily -Patient is satisfied with current therapy and denies issues -Recommended to continue current medication  Patient Goals/Self-Care Activities . Over the next 90 days, patient will:  - take medications as prescribed focus on medication adherence by enhanced pharmacy services  via [facility] check blood pressure twice a week, document, and provide at future appointments  Follow Up Plan: Telephone follow up appointment with care management team member scheduled for: 3 months      Medication Assistance: None required.  Patient affirms current coverage meets needs.  Compliance/Adherence/Medication fill history: Care Gaps: Covid vaccine Pneumonia vaccine - Prevnar  Star-Rating Drugs: Atorvastatin - LF 11/14/23 90 DS  Patient's preferred pharmacy is:  [id number] [place] [place]Phone: [phone]: [phone] [facility] [place] [place]Phone: [phone]: [phone] Uses pill box? No - prefers bottles Pt endorses 100% compliance  We discussed: Verbal consent obtained for [facility] enhanced pharmacy services (medication synchronization, adherence packaging, delivery coordination). A medication sync plan was created to allow patient to get all medications delivered once every 30 to 90 days per patient preference. Patient  understands they have freedom to choose pharmacy and clinical pharmacist will coordinate care between all prescribers and [facility].  Patient decided to: Utilize [facility] for medication synchronization, packaging and delivery  Care Plan and Follow Up Patient Decision:  Patient agrees to Care Plan and Follow-up.  Plan: Telephone follow up appointment with care management team member scheduled for:  3 months  LCharlene Brooke PharmD, BL[place] CPP Clinical Pharmacist [phone]

## 2021-01-16 ENCOUNTER — Other Ambulatory Visit: Payer: Self-pay | Admitting: Internal Medicine

## 2021-02-03 ENCOUNTER — Telehealth: Payer: Self-pay | Admitting: Pharmacist

## 2021-02-03 NOTE — Progress Notes (Signed)
    Chronic Care Management Pharmacy Assistant   Name: SIMPSON BAUMERT  MRN: [id number] DOB: 1950/09/19   Reason for Encounter: Medication [facility] visits:  None in previous 6 months  Medications: Outpatient Encounter Medications as of 02/03/2021  Medication Sig   amLODipine (NORVASC) 10 MG tablet Take 1 tablet (10 mg total) by mouth daily.   aspirin EC 81 MG tablet Take 1 tablet (81 mg total) by mouth daily. Swallow whole.   atorvastatin (LIPITOR) 40 MG tablet Take 2 tablets (80 mg total) by mouth daily.   carvedilol (COREG) 6.25 MG tablet Take 1 tablet (6.25 mg total) by mouth 2 (two) times daily with a meal.   furosemide (LASIX) 20 MG tablet Take 1 tablet (20 mg total) by mouth daily.   nitroGLYCERIN (NITROSTAT) 0.4 MG SL tablet Place 1 tablet (0.4 mg total) under the tongue every 5 (five) minutes x 3 doses as needed for chest pain.   potassium chloride SA (KLOR-CON) 20 MEQ tablet TAKE ONE TABLET BY MOUTH DAILY   ticagrelor (BRILINTA) 90 MG TABS tablet Take 1 tablet (90 mg total) by mouth 2 (two) times daily.   [DISCONTINUED] lisinopril (PRINIVIL,ZESTRIL) 10 MG tablet Take 1 tablet (10 mg total) by mouth daily.   No facility-administered encounter medications on file as of 02/03/2021.  Pharmacist Review Reviewed chart for medication changes ahead of medication coordination call.  No OVs, Consults, or hospital visits since last care coordination call/Pharmacist visit.  No medication changes indicated  BP Readings from Last 3 Encounters:  10/15/20 124/72  03/08/20 120/60  02/10/20 (!) 160/89    Lab Results  Component Value Date   HGBA1C 5.3 02/07/2020     Patient obtains medications through Vials  90 Days   Last adherence delivery included: Carvedilol 6.25 mg 1 tab 2 times daily-last fill 01/29/21 13 ds Furosemide 20 mg 1 tab daily-last fill 11/13/20 90 ds Atorvastatin 40 mg 2 tabs daily-last fill 11/13/20 90 ds Amlodipine 10 mg 1 tab daily-last fill 11/13/20 90  ds Aspirin 1 tab daily-last fill 11/13/20 90 ds Brilinta 90 mg 1 tab 2 times daily-last fill 01/29/21 13 ds Potassium ER 20 MEQ 1 tab daily-last fill 01/29/21 13 ds   Patient is due for next adherence delivery on: 02/12/21.  Called patient and reviewed medications and coordinated delivery. This delivery to include: Carvedilol 6.25 mg 1 tab 2 times daily-last fill 01/29/21 13 ds Furosemide 20 mg 1 tab daily-last fill 11/13/20 90 ds Atorvastatin 40 mg 2 tabs daily-last fill 11/13/20 90 ds Amlodipine 10 mg 1 tab daily-last fill 11/13/20 90 ds Aspirin 1 tab daily-last fill 11/13/20 90 ds Brilinta 90 mg 1 tab 2 times daily-last fill 01/29/21 13 ds Potassium ER 20 MEQ 1 tab daily-last fill 01/29/21 13 ds  Patient needs refills for aspirin-CPP to request.  Confirmed delivery date of 02/12/21, advised patient that pharmacy will contact them the morning of delivery.   Star Rating Drugs: Atorvastatin 40 mg-last refill 11/13/20 90 ds  Ethelene Hal Clinical Pharmacist Assistant [phone]   Time spent:40

## 2021-02-06 ENCOUNTER — Other Ambulatory Visit: Payer: Self-pay | Admitting: Internal Medicine

## 2021-04-15 DIAGNOSIS — I2510 Atherosclerotic heart disease of native coronary artery without angina pectoris: Secondary | ICD-10-CM | POA: Insufficient documentation

## 2021-04-15 NOTE — Patient Instructions (Signed)
  Blood work was ordered.     Medications changes include :     Your prescription(s) have been submitted to your pharmacy. Please take as directed and contact our office if you believe you are having problem(s) with the medication(s).   A referral was ordered for        Someone from their office will call you to schedule an appointment.    Please followup in 6 months  

## 2021-04-15 NOTE — Progress Notes (Signed)
Subjective:    Patient ID: Connor Martinez, male    DOB: May 17, 1951, 70 y.o.   MRN: [id number]  This visit occurred during the SARS-CoV-2 public health emergency.  Safety protocols were in place, including screening questions prior to the visit, additional usage of staff PPE, and extensive cleaning of exam room while observing appropriate contact time as indicated for disinfecting solutions.     HPI The patient is here for follow up of their chronic medical problems, including CAD, htn, hld, hyperglycemia, tobacco dep, obesity, recurrent angioedema.     Medications and allergies reviewed with patient and updated if appropriate.  Patient Active Problem List   Diagnosis Date Noted  . CAD (coronary artery disease) 04/15/2021  . Gynecomastia 10/15/2020  . NSTEMI (non-ST elevated myocardial infarction) ([place]) 02/07/2020  . Bilateral lower extremity edema 04/05/2019  . Ascending aortic aneurysm 09/19/2017  . Ear pressure, right 08/17/2017  . Abnormal EKG 08/17/2017  . Hyperglycemia 08/15/2017  . Folliculitis 35/70/1779  . 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 Sialadenitis 12/03/2016  . Neck pain 12/03/2016  . Hyperlipidemia 03/22/2016  . Psoriasis 02/10/2016  . Tobacco use disorder 11/26/2015  . Hypertension 11/26/2015  . Morbid obesity ([place]) 11/26/2015    Current Outpatient Medications on File Prior to Visit  Medication Sig Dispense Refill  . aspirin EC 81 MG tablet Take 1 tablet (81 mg total) by mouth daily. Swallow whole. 30 tablet 11  . atorvastatin (LIPITOR) 40 MG tablet Take 2 tablets (80 mg total) by mouth daily. 180 tablet 2  . carvedilol (COREG) 6.25 MG tablet TAKE ONE TABLET BY MOUTH TWICE DAILY WITH meals 180 tablet 1  . furosemide (LASIX) 20 MG tablet Take 1 tablet (20 mg total) by mouth daily. 90 tablet 2  . nitroGLYCERIN (NITROSTAT) 0.4 MG SL tablet Place 1 tablet (0.4 mg total) under the tongue every 5 (five) minutes x 3 doses as needed for chest pain. 25 tablet 7   . [DISCONTINUED] lisinopril (PRINIVIL,ZESTRIL) 10 MG tablet Take 1 tablet (10 mg total) by mouth daily. 90 tablet 3   No current facility-administered medications on file prior to visit.    Past Medical History:  Diagnosis Date  . Angioedema   . BPV (benign positional vertigo)   . Hemorrhoids   . Hyperlipidemia   . 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 BACK SURGERY    . CORONARY STENT INTERVENTION N/A 02/08/2020   Procedure: CORONARY STENT INTERVENTION;  Surgeon: Lorretta Harp, MD;  Location: [facility];  Service: Cardiovascular;  Laterality: N/A;  RCA and CIRCULFLEX  . LEFT HEART CATH AND CORONARY ANGIOGRAPHY N/A 02/08/2020   Procedure: LEFT HEART CATH AND CORONARY ANGIOGRAPHY;  Surgeon: Lorretta Harp, MD;  Location: [facility];  Service: Cardiovascular;  Laterality: N/A;  . SHOULDER SURGERY     Right    Social History   Socioeconomic History  . Marital status: Divorced    Spouse name: Not on file  . Number of children: Not on file  . Years of education: Not on file  . Highest education level: Not on file  Occupational History  . Not on file  Tobacco Use  . Smoking status: Every Day  . Smokeless tobacco: Never  Substance and Sexual Activity  . Alcohol use: Yes    Comment: infrequent  . Drug use: Not Currently  Types: Marijuana    Comment: occasional  . Sexual activity: Not on file  Other Topics Concern  . Not on file  Social History Narrative  . Not on file   Social Determinants of Health   Financial Resource Strain: Low Risk   . Difficulty of Paying Living Expenses: Not very hard  Food Insecurity: Not on file  Transportation Needs: Not on file  Physical Activity: Not on file  Stress: Not on file  Social Connections: Not on file    Family History  Problem Relation Age of Onset  . Stroke Mother   . Hypertension Mother   .  Heart disease Mother   . Hepatitis C Brother   . Hypertension Brother     Review of Systems     Objective:  There were no vitals filed for this visit. BP Readings from Last 3 Encounters:  10/15/20 124/72  03/08/20 120/60  02/10/20 (!) 160/89   Wt Readings from Last 3 Encounters:  10/15/20 214 lb (97.1 kg)  03/08/20 205 lb (93 kg)  02/10/20 209 lb 4.8 oz (94.9 kg)   There is no height or weight on file to calculate BMI.   Physical Exam    Constitutional: Appears well-developed and well-nourished. No distress.  HENT:  Head: Normocephalic and atraumatic.  Neck: Neck supple. No tracheal deviation present. No thyromegaly present.  No cervical lymphadenopathy Cardiovascular: Normal rate, regular rhythm and normal heart sounds.   No murmur heard. No carotid bruit .  No edema Pulmonary/Chest: Effort normal and breath sounds normal. No respiratory distress. No has no wheezes. No rales.  Skin: Skin is warm and dry. Not diaphoretic.  Psychiatric: Normal mood and affect. Behavior is normal.      Assessment & Plan:    See Problem List for Assessment and Plan of chronic medical problems.     This encounter was created in error - please disregard.

## 2021-04-16 ENCOUNTER — Encounter: Payer: Medicare HMO | Admitting: Internal Medicine

## 2021-04-16 DIAGNOSIS — T783XXD Angioneurotic edema, subsequent encounter: Secondary | ICD-10-CM

## 2021-04-16 DIAGNOSIS — I10 Essential (primary) hypertension: Secondary | ICD-10-CM

## 2021-04-16 DIAGNOSIS — E782 Mixed hyperlipidemia: Secondary | ICD-10-CM

## 2021-04-16 DIAGNOSIS — R739 Hyperglycemia, unspecified: Secondary | ICD-10-CM

## 2021-04-16 DIAGNOSIS — F17200 Nicotine dependence, unspecified, uncomplicated: Secondary | ICD-10-CM

## 2021-04-16 DIAGNOSIS — I2510 Atherosclerotic heart disease of native coronary artery without angina pectoris: Secondary | ICD-10-CM

## 2021-04-20 NOTE — Patient Instructions (Signed)
  Blood work was ordered.     Medications changes include :     Your prescription(s) have been submitted to your pharmacy. Please take as directed and contact our office if you believe you are having problem(s) with the medication(s).   A referral was ordered for        Someone from their office will call you to schedule an appointment.    Please followup in 6 months  

## 2021-04-20 NOTE — Progress Notes (Signed)
Subjective:    Patient ID: Connor Martinez, male    DOB: 19-Mar-1951, 70 y.o.   MRN: [id number]  This visit occurred during the SARS-CoV-2 public health emergency.  Safety protocols were in place, including screening questions prior to the visit, additional usage of staff PPE, and extensive cleaning of exam room while observing appropriate contact time as indicated for disinfecting solutions.     HPI The patient is here for follow up of their chronic medical problems, including CAD, htn, hld, hyperglycemia, tobacco dep, obesity, recurrent angioedema.     Medications and allergies reviewed with patient and updated if appropriate.  Patient Active Problem List   Diagnosis Date Noted  . CAD (coronary artery disease) 04/15/2021  . Gynecomastia 10/15/2020  . NSTEMI (non-ST elevated myocardial infarction) ([place]) 02/07/2020  . Bilateral lower extremity edema 04/05/2019  . Ascending aortic aneurysm 09/19/2017  . Ear pressure, right 08/17/2017  . Abnormal EKG 08/17/2017  . Hyperglycemia 08/15/2017  . Folliculitis 03/50/0938  . 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 Sialadenitis 12/03/2016  . Neck pain 12/03/2016  . Hyperlipidemia 03/22/2016  . Psoriasis 02/10/2016  . Tobacco use disorder 11/26/2015  . Hypertension 11/26/2015  . Morbid obesity ([place]) 11/26/2015    Current Outpatient Medications on File Prior to Visit  Medication Sig Dispense Refill  . aspirin EC 81 MG tablet Take 1 tablet (81 mg total) by mouth daily. Swallow whole. 30 tablet 11  . atorvastatin (LIPITOR) 40 MG tablet Take 2 tablets (80 mg total) by mouth daily. 180 tablet 2  . carvedilol (COREG) 6.25 MG tablet TAKE ONE TABLET BY MOUTH TWICE DAILY WITH meals 180 tablet 1  . furosemide (LASIX) 20 MG tablet Take 1 tablet (20 mg total) by mouth daily. 90 tablet 2  . nitroGLYCERIN (NITROSTAT) 0.4 MG SL tablet Place 1 tablet (0.4 mg total) under the tongue every 5 (five) minutes x 3 doses as needed for chest pain. 25 tablet 7   . [DISCONTINUED] lisinopril (PRINIVIL,ZESTRIL) 10 MG tablet Take 1 tablet (10 mg total) by mouth daily. 90 tablet 3   No current facility-administered medications on file prior to visit.    Past Medical History:  Diagnosis Date  . Angioedema   . BPV (benign positional vertigo)   . Hemorrhoids   . Hyperlipidemia   . 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 BACK SURGERY    . CORONARY STENT INTERVENTION N/A 02/08/2020   Procedure: CORONARY STENT INTERVENTION;  Surgeon: Lorretta Harp, MD;  Location: [facility];  Service: Cardiovascular;  Laterality: N/A;  RCA and CIRCULFLEX  . LEFT HEART CATH AND CORONARY ANGIOGRAPHY N/A 02/08/2020   Procedure: LEFT HEART CATH AND CORONARY ANGIOGRAPHY;  Surgeon: Lorretta Harp, MD;  Location: [facility];  Service: Cardiovascular;  Laterality: N/A;  . SHOULDER SURGERY     Right    Social History   Socioeconomic History  . Marital status: Divorced    Spouse name: Not on file  . Number of children: Not on file  . Years of education: Not on file  . Highest education level: Not on file  Occupational History  . Not on file  Tobacco Use  . Smoking status: Every Day  . Smokeless tobacco: Never  Substance and Sexual Activity  . Alcohol use: Yes    Comment: infrequent  . Drug use: Not Currently  Types: Marijuana    Comment: occasional  . Sexual activity: Not on file  Other Topics Concern  . Not on file  Social History Narrative  . Not on file   Social Determinants of Health   Financial Resource Strain: Low Risk   . Difficulty of Paying Living Expenses: Not very hard  Food Insecurity: Not on file  Transportation Needs: Not on file  Physical Activity: Not on file  Stress: Not on file  Social Connections: Not on file    Family History  Problem Relation Age of Onset  . Stroke Mother   . Hypertension Mother   .  Heart disease Mother   . Hepatitis C Brother   . Hypertension Brother     Review of Systems     Objective:  There were no vitals filed for this visit. BP Readings from Last 3 Encounters:  10/15/20 124/72  03/08/20 120/60  02/10/20 (!) 160/89   Wt Readings from Last 3 Encounters:  10/15/20 214 lb (97.1 kg)  03/08/20 205 lb (93 kg)  02/10/20 209 lb 4.8 oz (94.9 kg)   There is no height or weight on file to calculate BMI.   Physical Exam    Constitutional: Appears well-developed and well-nourished. No distress.  HENT:  Head: Normocephalic and atraumatic.  Neck: Neck supple. No tracheal deviation present. No thyromegaly present.  No cervical lymphadenopathy Cardiovascular: Normal rate, regular rhythm and normal heart sounds.   No murmur heard. No carotid bruit .  No edema Pulmonary/Chest: Effort normal and breath sounds normal. No respiratory distress. No has no wheezes. No rales.  Skin: Skin is warm and dry. Not diaphoretic.  Psychiatric: Normal mood and affect. Behavior is normal.      Assessment & Plan:    See Problem List for Assessment and Plan of chronic medical problems.     This encounter was created in error - please disregard.

## 2021-04-21 ENCOUNTER — Encounter: Payer: Medicare HMO | Admitting: Internal Medicine

## 2021-04-27 ENCOUNTER — Other Ambulatory Visit: Payer: Self-pay | Admitting: Internal Medicine

## 2021-04-27 ENCOUNTER — Other Ambulatory Visit: Payer: Self-pay | Admitting: Cardiovascular Disease

## 2021-04-30 ENCOUNTER — Telehealth: Payer: Self-pay | Admitting: Pharmacist

## 2021-04-30 NOTE — Progress Notes (Signed)
    Chronic Care Management Pharmacy Assistant   Name: Connor Martinez  MRN: [id number] DOB: 1951-03-11  Reason for Encounter: Medication Review Coordination Call  Recent office visits:  04/21/21 Burns (PCP) - Primary hypertension. No med changes.  04/16/21 Burns (PCP) - Primary hypertension. No med changes.  Recent consult visits:  None listed  Hospital visits:  None in previous 6 months  Medications: Outpatient Encounter Medications as of 04/30/2021  Medication Sig   amLODipine (NORVASC) 10 MG tablet TAKE ONE TABLET BY MOUTH DAILY   aspirin EC 81 MG tablet Take 1 tablet (81 mg total) by mouth daily. Swallow whole.   atorvastatin (LIPITOR) 40 MG tablet Take 2 tablets (80 mg total) by mouth daily.   BRILINTA 90 MG TABS tablet TAKE ONE TABLET BY MOUTH TWICE DAILY   carvedilol (COREG) 6.25 MG tablet TAKE ONE TABLET BY MOUTH TWICE DAILY WITH meals   furosemide (LASIX) 20 MG tablet Take 1 tablet (20 mg total) by mouth daily.   nitroGLYCERIN (NITROSTAT) 0.4 MG SL tablet Place 1 tablet (0.4 mg total) under the tongue every 5 (five) minutes x 3 doses as needed for chest pain.   potassium chloride SA (KLOR-CON) 20 MEQ tablet TAKE ONE TABLET BY MOUTH ONCE DAILY   [DISCONTINUED] lisinopril (PRINIVIL,ZESTRIL) 10 MG tablet Take 1 tablet (10 mg total) by mouth daily.   No facility-administered encounter medications on file as of 04/30/2021.   Reviewed chart for medication changes ahead of medication coordination call.  No OVs, Consults, or hospital visits since last care coordination call/Pharmacist visit. (If appropriate, list visit date, provider name)  No medication changes indicated OR if recent visit, treatment plan here.  BP Readings from Last 3 Encounters:  10/15/20 124/72  03/08/20 120/60  02/10/20 (!) 160/89    Lab Results  Component Value Date   HGBA1C 5.3 02/07/2020     Patient obtains medications through [facility]  90 Days    Patient declined (meds) last month due to PRN  use/additional supply on hand. Explanation of abundance on hand (ie #30 due to overlapping fills or previous adherence issues etc)  Patient is due for next adherence delivery on: 05/09/21. Called patient and reviewed medications and coordinated delivery.  This delivery to include: Carvedilol 6.25 mg 1 tab 2 times daily-last fill 01/29/21 13 ds Furosemide 20 mg 1 tab daily-last fill 11/13/20 90 ds Atorvastatin 40 mg 2 tabs daily-last fill 11/13/20 90 ds Amlodipine 10 mg 1 tab daily-last fill 11/13/20 90 ds Aspirin 1 tab daily-last fill 11/13/20 90 ds Potassium ER 20 MEQ 1 tab daily-last fill 01/29/21 13 ds  Reviewed chart for medication changes ahead of medication coordination call.   Attempted to contact patient x 3 for medication review and delivery, unable to reach patient, left voicemails to return call.  Care Gaps Colonoscopy - Appt. 05/25/21 Diabetic Foot Exam - NA Mammogram - NA Ophthalmology - NA Dexa Scan - NA Annual Well Visit - NA Micro albumin - NA Hemoglobin A1c - 5.3 - 02/07/20  Star Rating Drugs: Atorvastatin - filled 02/06/21 [place], [place] Clinical Pharmacists Assistant [phone]

## 2021-06-06 ENCOUNTER — Telehealth: Payer: Self-pay

## 2021-06-06 NOTE — Telephone Encounter (Signed)
Patient has a lump about the size of a sproul of thread that's located in the groin area. He describes it to be is painful with a lot of blood and pus discharge from it at times.  Patient requested a telephone visit with his provider today however patient was told he needs to be seen for a better evaluation and has chosen to go to Urgent Care due to not being able to come in today.

## 2021-06-18 ENCOUNTER — Other Ambulatory Visit: Payer: Self-pay | Admitting: Cardiovascular Disease

## 2021-06-24 NOTE — Telephone Encounter (Signed)
He has seen derm for this in the past - one year ago - he can call them directly and see if they can get him in quickly. It was [facility] -- [phone]

## 2021-06-24 NOTE — Telephone Encounter (Signed)
Patient contacted and info given with number so he could call and make appointment.

## 2021-06-24 NOTE — Telephone Encounter (Signed)
Patient states he has a sproul of thread and wart under the left size of his testicle  Patient states area is very painful and if pressure is applied pus and blood drains for sproul  Patient is requesting a referral to a [facility] outpatient skin surgeon   Offered patient ov, patient states it is hard for him to move due to the pain  Patient is requesting a call back

## 2021-06-27 ENCOUNTER — Telehealth: Payer: Self-pay | Admitting: Internal Medicine

## 2021-06-27 DIAGNOSIS — L02215 Cutaneous abscess of perineum: Secondary | ICD-10-CM

## 2021-06-27 NOTE — Telephone Encounter (Signed)
Patient calling in  Patient says he would like for provider to review CT imaging that was done 05/2020  Patient is still experiencing pain in his left testicle that goes all the way up into his hip & patient says it is very painful to walk  Please call patient once reviewed [phone]

## 2021-06-27 NOTE — Telephone Encounter (Signed)
Spoke with patient today.  He had called Dr. Juel Burrow office but cx appointment due to wait.  In the meantime he states he would like to be referred or seen by someone who can treat him for testicular pain.  He has asked for you to look at his CT regarding his pelvis for re-evaluation and send him somewhere that they will actually help him.  I reiterated to him if he was having pain in his testicles he needed to be evaluated as he states he can hardly walk.  He is insistent that you review his scan.

## 2021-06-27 NOTE — Telephone Encounter (Signed)
Referral ordered for surgery-based on his CT has an abscess in that area and it may need to be drained.  They should call in the weekend -Call [facility] to see if they can schedule him

## 2021-06-30 NOTE — Telephone Encounter (Signed)
Called [facility] today and spoke with Lilia Pro. She said she called patient today and left him a message to return call to Lilia Pro so she could get him set up for appointment.

## 2021-07-03 ENCOUNTER — Other Ambulatory Visit: Payer: Self-pay

## 2021-07-03 ENCOUNTER — Ambulatory Visit (HOSPITAL_COMMUNITY)
Admission: EM | Admit: 2021-07-03 | Discharge: 2021-07-03 | Disposition: A | Discharge status: Home / Self Care | Payer: Medicare HMO | Attending: Emergency Medicine | Admitting: Emergency Medicine

## 2021-07-03 ENCOUNTER — Encounter (HOSPITAL_COMMUNITY): Payer: Self-pay | Admitting: Emergency Medicine

## 2021-07-03 DIAGNOSIS — L02215 Cutaneous abscess of perineum: Secondary | ICD-10-CM | POA: Diagnosis not present

## 2021-07-03 MED ORDER — TRAMADOL HCL 50 MG PO TABS: 50.0000 mg | ORAL_TABLET | Freq: Four times a day (QID) | ORAL | 0 refills | Status: AC | PRN

## 2021-07-03 NOTE — Discharge Instructions (Addendum)
Take doxycyline twice a day for 7 days  Please keep appointment tomorrow with specialist   Hold warm-hot compresses to affected area at least 4 times a day, this helps to facilitate draining, the more the better  Take tramadol every 6 hours as needed for pain, use sparingly, you have been dispensed 12 tablets    Please return for evaluation for increased swelling, increased tenderness or pain, non healing site, non draining site, you begin to have fever or chills   We reviewed the etiology of recurrent abscesses of skin.  Skin abscesses are collections of pus within the dermis and deeper skin tissues. Skin abscesses manifest as painful, tender, fluctuant, and erythematous nodules, frequently surmounted by a pustule and surrounded by a rim of erythematous swelling.  Spontaneous drainage of purulent material may occur.  Fever can occur on occasion.    -Skin abscesses can develop in healthy individuals with no predisposing conditions other than skin or nasal carriage of Staphylococcus aureus.  Individuals in close contact with others who have active infection with skin abscesses are at increased risk which is likely to explain why twin brother has similar episodes.   In addition, any process leading to a breach in the skin barrier can also predispose to the development of a skin abscesses, such as atopic dermatitis.

## 2021-07-03 NOTE — ED Triage Notes (Signed)
Reports an abscess, history of the same.  Abscess is reportedly between scrotum and rectum

## 2021-07-03 NOTE — ED Provider Notes (Addendum)
[facility]    CSN: [id number] Arrival date & time: 07/03/21  1033      History   Chief Complaint Chief Complaint  Patient presents with   Abscess    HPI Connor Martinez is a 71 y.o. male.   Patient presents with abscess to perineum occurring intermittently for the last year.  Area is painful and tender and has begun to drain over the last 3 weeks.  Had an appointment with [facility] today for evaluation but was unable to obtain due to flat entire, rescheduled for tomorrow.  Denies fever, chills.  Past Medical History:  Diagnosis Date   Angioedema    BPV (benign positional vertigo)    Hemorrhoids    Hyperlipidemia    Sciatica    MRI with disc herniations L3-4, L4-5 s/p epidural injections by Dr [name]    Patient Active Problem List   Diagnosis Date Noted   CAD (coronary artery disease) 04/15/2021   Gynecomastia 10/15/2020   NSTEMI (non-ST elevated myocardial infarction) ([place]) 02/07/2020   Bilateral lower extremity edema 04/05/2019   Ascending aortic aneurysm 09/19/2017   Ear pressure, right 08/17/2017   Abnormal EKG 08/17/2017   Hyperglycemia 62/70/3500   Folliculitis 93/81/8299   Angioedema, recurrent 12/03/2016   Sialadenitis 12/03/2016   Neck pain 12/03/2016   Hyperlipidemia 03/22/2016   Psoriasis 02/10/2016   Tobacco use disorder 11/26/2015   Hypertension 11/26/2015   Morbid obesity ([place]) 11/26/2015    Past Surgical History:  Procedure Laterality Date   BACK SURGERY     CORONARY STENT INTERVENTION N/A 02/08/2020   Procedure: CORONARY STENT INTERVENTION;  Surgeon: Lorretta Harp, MD;  Location: [facility];  Service: Cardiovascular;  Laterality: N/A;  RCA and CIRCULFLEX   LEFT HEART CATH AND CORONARY ANGIOGRAPHY N/A 02/08/2020   Procedure: LEFT HEART CATH AND CORONARY ANGIOGRAPHY;  Surgeon: Lorretta Harp, MD;  Location: [facility];  Service: Cardiovascular;  Laterality: N/A;   SHOULDER SURGERY     Right       Home  Medications    Prior to Admission medications   Medication Sig Start Date End Date Taking? Authorizing Provider  traMADol (ULTRAM) 50 MG tablet Take 1 tablet (50 mg total) by mouth every 6 (six) hours as needed for up to 3 days. 07/03/21 07/06/21 Yes Desmen Schoffstall R, NP  amLODipine (NORVASC) 10 MG tablet TAKE ONE TABLET BY MOUTH DAILY 04/28/21   [name], MD  aspirin EC 81 MG tablet Take 1 tablet (81 mg total) by mouth daily. Swallow whole. 02/10/20   Bhagat, Crista Luria, PA  atorvastatin (LIPITOR) 40 MG tablet Take 2 tablets (80 mg total) by mouth daily. 08/16/20   Sherren Mocha, MD  carvedilol (COREG) 6.25 MG tablet TAKE ONE TABLET BY MOUTH TWICE DAILY WITH meals 02/06/21   [name], MD  furosemide (LASIX) 20 MG tablet Take 1 tablet (20 mg total) by mouth daily. 08/16/20   Sherren Mocha, MD  nitroGLYCERIN (NITROSTAT) 0.4 MG SL tablet Place 1 tablet (0.4 mg total) under the tongue every 5 (five) minutes x 3 doses as needed for chest pain. 08/16/20   Sherren Mocha, MD  potassium chloride SA (KLOR-CON) 20 MEQ tablet TAKE ONE TABLET BY MOUTH ONCE DAILY 04/28/21   [name], MD  ticagrelor (BRILINTA) 90 MG TABS tablet Take 1 tablet (90 mg total) by mouth 2 (two) times daily. Please make overdue appt with Dr. Burt Knack before anymore refills. Thank you 2nd attempt 06/18/21  Sherren Mocha, MD  lisinopril (PRINIVIL,ZESTRIL) 10 MG tablet Take 1 tablet (10 mg total) by mouth daily. 11/26/15 07/06/18  [name], MD    Family History Family History  Problem Relation Age of Onset   Stroke Mother    Hypertension Mother    Heart disease Mother    Hepatitis C Brother    Hypertension Brother     Social History Social History   Tobacco Use   Smoking status: Every Day   Smokeless tobacco: Never  Vaping Use   Vaping Use: Never used  Substance Use Topics   Alcohol use: Not Currently    Comment: infrequent   Drug use: Not Currently    Types: Marijuana    Comment: occasional      Allergies   Ace inhibitors and Penicillins   Review of Systems Review of Systems  Constitutional: Negative.   Respiratory: Negative.    Cardiovascular: Negative.   Skin:  Positive for wound. Negative for color change, pallor and rash.  Neurological: Negative.     Physical Exam Triage Vital Signs ED Triage Vitals  Enc Vitals Group     BP --      Pulse Rate 07/03/21 1123 89     Resp 07/03/21 1123 (!) 24     Temp 07/03/21 1123 97.8 F (36.6 C)     Temp Source 07/03/21 1123 Oral     SpO2 07/03/21 1123 97 %     Weight --      Height --      Head Circumference --      Peak Flow --      Pain Score 07/03/21 1120 2     Pain Loc --      Pain Edu? --      Excl. in [place]? --    No data found.  Updated Vital Signs Pulse 89    Temp 97.8 F (36.6 C) (Oral)    Resp (!) 24    SpO2 97%   Visual Acuity Right Eye Distance:   Left Eye Distance:   Bilateral Distance:    Right Eye Near:   Left Eye Near:    Bilateral Near:     Physical Exam Constitutional:      Appearance: Normal appearance. He is normal weight.  HENT:     Head: Normocephalic.  Eyes:     Extraocular Movements: Extraocular movements intact.  Pulmonary:     Effort: Pulmonary effort is normal.  Genitourinary:      Comments: Immature abscess present on the right perineum, scant purulent drainage noted, tender to palpation Skin:    General: Skin is warm and dry.  Neurological:     Mental Status: He is alert and oriented to person, place, and time. Mental status is at baseline.  Psychiatric:        Mood and Affect: Mood normal.        Behavior: Behavior normal.     UC Treatments / Results  Labs (all labs ordered are listed, but only abnormal results are displayed) Labs Reviewed - No data to display  EKG   Radiology No results found.  Procedures Procedures (including critical care time)  Medications Ordered in UC Medications - No data to display  Initial Impression / Assessment and Plan /  UC Course  I have reviewed the triage vital signs and the nursing notes.  Pertinent labs & imaging results that were available during my care of the patient were reviewed by me and considered in  my medical decision making (see chart for details).  Abscess of perineum  Will defer I&D today due to firmness of abscess, discussed with patient, CT of abdomen reviewed from December 2021, abscess was cutaneous with no involvement of the testicle or perianal region will start doxycycline 7-day course and recommended warm compresses to affected area is much as tolerated, encourage patient to keep upcoming appointment with [place] [facility] for evaluation of removal of sac, tramadol 50 mg every 6 hours prescribed, 12 tablets dispensed, PDMP reviewed, low risk Final Clinical Impressions(s) / UC Diagnoses   Final diagnoses:  Abscess of perineum     Discharge Instructions      Take doxycyline twice a day for 7 days  Please keep appointment tomorrow with specialist   Hold warm-hot compresses to affected area at least 4 times a day, this helps to facilitate draining, the more the better  Take tramadol every 6 hours as needed for pain, use sparingly, you have been dispensed 12 tablets    Please return for evaluation for increased swelling, increased tenderness or pain, non healing site, non draining site, you begin to have fever or chills   We reviewed the etiology of recurrent abscesses of skin.  Skin abscesses are collections of pus within the dermis and deeper skin tissues. Skin abscesses manifest as painful, tender, fluctuant, and erythematous nodules, frequently surmounted by a pustule and surrounded by a rim of erythematous swelling.  Spontaneous drainage of purulent material may occur.  Fever can occur on occasion.    -Skin abscesses can develop in healthy individuals with no predisposing conditions other than skin or nasal carriage of Staphylococcus aureus.  Individuals in close contact with  others who have active infection with skin abscesses are at increased risk which is likely to explain why twin brother has similar episodes.   In addition, any process leading to a breach in the skin barrier can also predispose to the development of a skin abscesses, such as atopic dermatitis.      ED Prescriptions     Medication Sig Dispense Auth. Provider   traMADol (ULTRAM) 50 MG tablet Take 1 tablet (50 mg total) by mouth every 6 (six) hours as needed for up to 3 days. 12 tablet Sherrilynn Gudgel, Leitha Schuller, NP      I have reviewed the PDMP during this encounter.   Hans Eden, NP 07/03/21 1158    Hans Eden, NP 07/03/21 1159

## 2021-07-04 ENCOUNTER — Ambulatory Visit: Payer: Self-pay | Admitting: Surgery

## 2021-07-04 ENCOUNTER — Telehealth: Payer: Self-pay | Admitting: *Deleted

## 2021-07-04 DIAGNOSIS — N5089 Other specified disorders of the male genital organs: Secondary | ICD-10-CM | POA: Diagnosis not present

## 2021-07-04 NOTE — Telephone Encounter (Signed)
Pt has been scheduled to see Ermalinda Barrios, PA-C, 07/08/21, clearance will be addressed at that time.  Will route back to the requesting surgeon's office to make them aware.

## 2021-07-04 NOTE — Telephone Encounter (Signed)
Patient returned call

## 2021-07-04 NOTE — Telephone Encounter (Signed)
° °  Name: ALGIS LEHENBAUER  DOB: 01-17-1951  MRN: [id number]  Primary Cardiologist: Sherren Mocha, MD  Chart reviewed as part of pre-operative protocol coverage. Because of TAHJIR SILVERIA past medical history and time since last visit, he will require a follow-up visit in order to better assess preoperative cardiovascular risk.  Pre-op covering staff: - Please schedule appointment and call patient to inform them. If patient already had an upcoming appointment within acceptable timeframe, please add "pre-op clearance" to the appointment notes so provider is aware. - Please contact requesting surgeon's office via preferred method (i.e, phone, fax) to inform them of need for appointment prior to surgery.  If applicable, this message will also be routed to pharmacy pool and/or primary cardiologist for input on holding anticoagulant/antiplatelet agent as requested below so that this information is available to the clearing provider at time of patient's appointment.   [place], [place]  07/04/2021, 11:38 AM

## 2021-07-04 NOTE — H&P (Signed)
Subjective    Chief Complaint: Abscess (Perineum/)     History of Present Illness: Connor Martinez is a 71 y.o. male who was seen by Dr. Bobbye Morton in Dec 2021 for perineal pain. He was taking Brilinta and ASA81 for cardiac stents placed 02/2020 at [facility].  CT scan was obtained which showed a 5.2 x 2.2 cm rim enhancing fluid collection in the right perineum.  He was advised to be direct admitted for I&D but refused and never followed-up.   He contacted his PCP in Dec 2022, one year later, c/o an area of persistent swelling, pain, and drainage in the perineum, at the same location as last year.  He states that it occasionally drains bloody/white drainage when he is in the shower, but has not drained today.  He states there is no major pain unless he is sitting on it.  He denies fever.  He also complains about an anal wart that has been treated with cryotherapy but is still present.  He is still taking aspirin and Brilinta but did not take Brilinta today because he ran out.     Review of Systems: A complete review of systems was obtained from the patient.  I have reviewed this information and discussed as appropriate with the patient.  See HPI as well for other ROS.   ROS  All other review of systems negative.     Medical History: Past Medical History  History reviewed. No pertinent past medical history.     There is no problem list on file for this patient.     Past Surgical History  History reviewed. No pertinent surgical history.      Allergies       Allergies  Allergen Reactions   Ace Inhibitors Swelling      angioedema   Penicillins Rash              Current Outpatient Medications on File Prior to Visit  Medication Sig Dispense Refill   amLODIPine (NORVASC) 10 MG tablet TAKE ONE TABLET BY MOUTH ONCE DAILY please call to schedule appt with office for further refills       atorvastatin (LIPITOR) 40 MG tablet Take 80 mg by mouth once daily       BRILINTA 90 mg tablet TAKE ONE  TABLET BY MOUTH TWICE DAILY Needs appointment for further refills       carvediloL (COREG) 6.25 MG tablet Take 6.25 mg by mouth 2 (two) times daily with meals       FUROsemide (LASIX) 20 MG tablet Take 20 mg by mouth once daily       potassium chloride (KLOR-CON) 20 MEQ ER tablet TAKE ONE TABLET BY MOUTH ONCE DAILY please schedule an appt for further refills       traMADoL (ULTRAM) 50 mg tablet TAKE ONE TABLET BY MOUTH EVERY 6 HOURS AS NEEDED FOR UP TO THREE DAYS        No current facility-administered medications on file prior to visit.      Family History       Family History  Problem Relation Age of Onset   Coronary Artery Disease (Blocked arteries around heart) Mother     Hyperlipidemia (Elevated cholesterol) Mother     High blood pressure (Hypertension) Mother     Obesity Mother     Coronary Artery Disease (Blocked arteries around heart) Brother          Social History       Tobacco Use  Smoking Status  Never  Smokeless Tobacco Never      Social History  Social History        Socioeconomic History   Marital status: Divorced  Tobacco Use   Smoking status: Never   Smokeless tobacco: Never  Substance and Sexual Activity   Alcohol use: Never   Drug use: Never        Objective:    BP 118/80    Pulse 73    Temp 37.3 C (99.1 F)    Ht 167.6 cm (5\' 6" )    Wt (!) 101.8 kg (224 lb 6.4 oz)    SpO2 98%    BMI 36.22 kg/m  Body mass index is 36.22 kg/m.   General: Well-developed, well-nourished, in no acute distress.  Wearing mask Eyes: No scleral icterus.  Pupils equal, lids normal Respiratory: Normal effort, no use of accessory muscles Musculoskeletal: Normal gait. Grossly normal ROM upper and lower extremities  Psychiatric: Normal judgement and insight.  Normal mood and affect.  Alert, oriented x 3   Right perineum: A 6 cm by 4 cm mobile, soft tissue mass without overlying erythema or induration.  There is one punctate opening on the lateral aspect of the mass  without active drainage.   Assessment and Plan:    Diagnoses and all orders for this visit:   Perineal mass in male   Patient is presenting to the office with a chronic right perineal abscess cavity that would benefit from excision in the OR.  There are no active signs of infection warranting incision and drainage today.  Dr. Georgette Dover also evaluated the patient and explained the procedure, risks, and benefits for excision.  The patient agrees to proceed and will call with any questions or concerns before the surgery.   This patient encounter took moderate decision making and 40 minutes today to perform the following: review records, take history, perform exam, counsel the patient on the diagnosis, and document encounter, findings, and plan in the EHR.   Carlena Hurl, PA-C  I examined the patient and recommended incision and drainage of the chronic perineal abscess under general anesthesia.  He also has a tiny skin tag/ wart in the perianal region that may be excised at the time of surgery.  The surgical procedure has been discussed with the patient.  Potential risks, benefits, alternative treatments, and expected outcomes have been explained.  All of the patient's questions at this time have been answered.  The likelihood of reaching the patient's treatment goal is good.  The patient understand the proposed surgical procedure and wishes to proceed.  Imogene Burn. Georgette Dover, MD, [facility] Surgery  General Surgery   07/04/2021 12:01 PM

## 2021-07-04 NOTE — Telephone Encounter (Signed)
Call placed to pt re: surgical clearance.  Noticed pt hadn't been seen since 02/2020 and will need appt. I have left pt a message to call back to schedule appointment for clearance.      Pre-operative Risk Assessment    Patient Name: Connor Martinez  DOB: Dec 25, 1950 MRN: [id number]      Request for Surgical Clearance    Procedure:   CHRONIC PERIANAL ABSCESS   Date of Surgery:  Clearance TBD                                 Surgeon:  DR. Georgette Dover Surgeon's Group or Practice Name:   [facility] Phone number:   [phone] Fax number:   [phone]   Type of Clearance Requested:   - Pharmacy:  Hold Aspirin and Ticagrelor (Brilinta)     Type of Anesthesia:  General    Additional requests/questions:   Astrid Divine   07/04/2021, 11:13 AM

## 2021-07-07 NOTE — Progress Notes (Deleted)
Cardiology Office Note    Date:  07/07/2021   ID:  Connor Martinez, Connor Martinez September 24, 1950, MRN [id number]   PCP:  [name], [place]  Cardiologist:  Sherren Mocha, MD *** Advanced Practice Provider:  No care team member to display Electrophysiologist:  None   [id number]}   No chief complaint on file.   History of Present Illness:  Connor Martinez is a 71 y.o. male with history of Coronary artery disease S/p NSTEMI 01/2020 tx with DES to [facility] and DES to mLCx with residual ocluded D1 mod disease in LAD, Heart failure with reduced ejection fraction/Ischemic CM-Echocardiogram 8/21: EF 45-50, Gr 2 DD,Thoracic aortic aneurysm CT 8/21: 4.4 cm Followed by Dr.Bartle, Abdomina aortic ectasia,Hypertension  Angioedema with ACEi, Hyperlipidemia, Obesity, Tobacco use.  Patient last saw Mr. Jorene Minors 03/08/21 and was doing well. Smoking cessation discussed.  Patient on my schedule for Preop clearance for chronic perianal abscess by Dr. Georgette Dover   Past Medical History:  Diagnosis Date   Angioedema    BPV (benign positional vertigo)    Hemorrhoids    Hyperlipidemia    Sciatica    MRI with disc herniations L3-4, L4-5 s/p epidural injections by Dr [name]    Past Surgical History:  Procedure Laterality Date   BACK SURGERY     CORONARY STENT INTERVENTION N/A 02/08/2020   Procedure: CORONARY STENT INTERVENTION;  Surgeon: Lorretta Harp, MD;  Location: [facility];  Service: Cardiovascular;  Laterality: N/A;  RCA and CIRCULFLEX   LEFT HEART CATH AND CORONARY ANGIOGRAPHY N/A 02/08/2020   Procedure: LEFT HEART CATH AND CORONARY ANGIOGRAPHY;  Surgeon: Lorretta Harp, MD;  Location: [facility];  Service: Cardiovascular;  Laterality: N/A;   SHOULDER SURGERY     Right    Current Medications: No outpatient medications have been marked as taking for the 07/08/21 encounter (Appointment) with Imogene Burn, PA-C.     Allergies:   Ace inhibitors and  Penicillins   Social History   Socioeconomic History   Marital status: Divorced    Spouse name: Not on file   Number of children: Not on file   Years of education: Not on file   Highest education level: Not on file  Occupational History   Not on file  Tobacco Use   Smoking status: Every Day   Smokeless tobacco: Never  Vaping Use   Vaping Use: Never used  Substance and Sexual Activity   Alcohol use: Not Currently    Comment: infrequent   Drug use: Not Currently    Types: Marijuana    Comment: occasional   Sexual activity: Not on file  Other Topics Concern   Not on file  Social History Narrative   Not on file   Social Determinants of Health   Financial Resource Strain: Low Risk    Difficulty of Paying Living Expenses: Not very hard  Food Insecurity: Not on file  Transportation Needs: Not on file  Physical Activity: Not on file  Stress: Not on file  Social Connections: Not on file     Family History:  The patient's ***family history includes Heart disease in his mother; Hepatitis C in his brother; Hypertension in his brother and mother; Stroke in his mother.   ROS:   Please see the history of present illness.    ROS All other systems reviewed and are negative.   PHYSICAL EXAM:   VS:  There were no vitals  taken for this visit.  Physical Exam  GEN: Well nourished, well developed, in no acute distress  HEENT: normal  Neck: no JVD, carotid bruits, or masses Cardiac:RRR; no murmurs, rubs, or gallops  Respiratory:  clear to auscultation bilaterally, normal work of breathing GI: soft, nontender, nondistended, + BS Ext: without cyanosis, clubbing, or edema, Good distal pulses bilaterally MS: no deformity or atrophy  Skin: warm and dry, no rash Neuro:  Alert and Oriented x 3, Strength and sensation are intact Psych: euthymic mood, full affect  Wt Readings from Last 3 Encounters:  10/15/20 214 lb (97.1 kg)  03/08/20 205 lb (93 kg)  02/10/20 209 lb 4.8 oz (94.9 kg)       Studies/Labs Reviewed:   EKG:  EKG is*** ordered today.  The ekg ordered today demonstrates ***  Recent Labs: No results found for requested labs within last 8760 hours.   Lipid Panel    Component Value Date/Time   CHOL 128 04/19/2020 0907   TRIG 83 04/19/2020 0907   HDL 44 04/19/2020 0907   CHOLHDL 2.9 04/19/2020 0907   CHOLHDL 4 10/25/2019 1442   VLDL 15.4 10/25/2019 1442   LDLCALC 68 04/19/2020 0907    Additional studies/ records that were reviewed today include:  Echocardiogram 02/08/20 Inf HK, EF 45-50, GLS-14.7%, severe LVE, mod LVH, Gr 2 DD, normal RVSF, mild to mod LAE, trivial MR, mild dilation of ascending aorta (44 mm)   Cardiac catheterization 02/08/20 LAD prox 50, mid 50; Lat D1 100 LCx mid 99 RCA mid 95 (thrombotic) LVEDP 36 PCI:  2.25 x 24 mm Synergy DES to mLCx PCI:  2.25 x 16 mm Synergy DES to mRCA    Chest/Abd/Pelvic CTA 02/07/20 IMPRESSION: 1. No evidence of aortic dissection. 2. Ascending thoracic aorta measures 4.4 cm at the level of the main pulmonary artery. Recommend annual imaging followup by CTA or MRA. This recommendation follows 2010 ACCF/AHA/AATS/ACR/ASA/SCA/SCAI/SIR/STS/SVM Guidelines for the Diagnosis and Management of Patients with Thoracic Aortic Disease. Circulation. 2010; 121: J856-D149. Aortic aneurysm NOS (ICD10-I71.9) 3. 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 Recommend follow-up every 5 years. This recommendation follows ACR consensus guidelines: White Paper of the ACR Incidental Findings Committee II on Vascular Findings. J Am Coll Radiol 2013; 70:263-785. 4. Aortic Atherosclerosis (ICD10-I70.0).   Echocardiogram 09/15/17 EF 60-65, Gr 1 DD, mod LVH, Ao root 40 mm, Asc Ao 44 mm, tr MR, mild RAE, mod LAE     Risk Assessment/Calculations:   {Does this patient have ATRIAL FIBRILLATION?[phone]}     ASSESSMENT:    No diagnosis found.   PLAN:  In order of problems listed above:  Preop clearance for chronic  perianal abscess by Dr. Georgette Dover  Coronary artery disease S/p NSTEMI 8/21 tx with DES to [facility] and DES to mLCx with residual ocluded D1 mod disease in LAD  Heart failure with reduced ejection fraction/Ischemic CM-Echocardiogram 8/21: EF 45-50, Gr 2 DD   Thoracic aortic aneurysm CT 8/21: 4.4 cm Followed by Dr.Bartle  Abdomina aortic ectasia  Hypertension  Angioedema with ACEi  Hyperlipidemia   Obesity   Tobacco use   Shared Decision Making/Informed Consent   {Are you ordering a CV Procedure (e.g. stress test, cath, DCCV, TEE, etc)?   Press F2        :[id number]}    Medication Adjustments/Labs and Tests Ordered: Current medicines are reviewed at length with the patient today.  Concerns regarding medicines are outlined above.  Medication changes, Labs and Tests ordered today are listed in the Patient  Instructions below. There are no Patient Instructions on file for this visit.   Sumner Boast, PA-C  07/07/2021 10:23 AM    [facility] [address], [place], [place]  [address] Phone: [phone]; Fax: [phone]

## 2021-07-08 ENCOUNTER — Telehealth: Payer: Self-pay | Admitting: Physician Assistant

## 2021-07-08 ENCOUNTER — Ambulatory Visit: Payer: Medicare HMO | Admitting: Physician Assistant

## 2021-07-08 NOTE — Telephone Encounter (Signed)
Hammer, Angeline S routed conversation to [employer] Just now (11:14 AM)   Esmeralda Links, Angeline S Just now (11:14 AM)   AH     Pt called, he said, he can't come in to the office today because he is in pain and cannot move, he requested if the appt can be done virtually. Explained that he was not seen since 2021 and the doctor needs to do a physical examination and EKG to make sure he is safe to held his meds and be clear for his procedure. He complaint that we only make him sit at the doctor's office talks to him do a blood work and then clear him, he said it is really not that important and just clear him. He even said, he is willing to sign a liability form and just clear him, that if something happen he will not sue [place]. Advised that it is his surgeon who's requesting the clearance from [place] and if he wanted to opt out he needs to call his surgeon. He understood and he said he will call his surgeon's office. At the end of the call, he did request to cancel his appt today.      Note    [place], [place] [phone]  Esmeralda Links, [place] reached out to the pre op call back team today. I did confirm that the pt has not been seen since 2021. I stated the pt will need to  have an in-person appt in order for a complete assessment, including EKG and vital signs done by cardiology. We do understand the pt c/o pain. Our office does all that we need to be sure that we are protecting the pt before any procedure/surgery. Pt has had NSTEMI in the past. It was offered to the pt that we have wheel chairs and that we can meet him in the main lobby with a wheel chair and use the elevator. [facility] themselves on caring for our pt's. The pt has chosen to not be seen by the cardiologist and appt for today has been cancelled. See notes above from [employer], scheduler. I will forward these notes to Dr. Georgette Dover.

## 2021-07-08 NOTE — Telephone Encounter (Signed)
° °  Pt called, he said, he can't come in to the office today because he is in pain and cannot move, he requested if the appt can be done virtually. Explained that he was not seen since 2021 and the doctor needs to do a physical examination and EKG to make sure he is safe to held his meds and be clear for his procedure. He complaint that we only make him sit at the doctor's office talks to him do a blood work and then clear him, he said it is really not that important and just clear him. He even said, he is willing to sign a liability form and just clear him, that if something happen he will not sue [place]. Advised that it is his surgeon who's requesting the clearance from [place] and if he wanted to opt out he needs to call his surgeon. He understood and he said he will call his surgeon's office. At the end of the call, he did request to cancel his appt today.

## 2021-07-08 NOTE — Telephone Encounter (Signed)
These notes have been added to clearance request and have been forwarded to surgeon Dr. Georgette Dover.

## 2021-07-16 NOTE — Progress Notes (Signed)
Office Visit    Patient Name: Connor Martinez Date of Encounter: 07/17/2021  Primary Care Provider:  [name], MD Primary Cardiologist:  Sherren Mocha, MD  Chief Complaint    71 year old male with a history of CAD s/p NSTEMI, DES-mRCA and mCX in 9211, chronic systolic heart failure, ICM, dilation of ascending aorta, abdominal aortic ectasia, hypertension, hyperlipidemia, tobacco use, angioedema, and morbid obesity who presents for follow-up related to CAD.  Past Medical History    Past Medical History:  Diagnosis Date   Angioedema    BPV (benign positional vertigo)    Hemorrhoids    Hyperlipidemia    Sciatica    MRI with disc herniations L3-4, L4-5 s/p epidural injections by Dr [name]   Past Surgical History:  Procedure Laterality Date   BACK SURGERY     CORONARY STENT INTERVENTION N/A 02/08/2020   Procedure: CORONARY STENT INTERVENTION;  Surgeon: Lorretta Harp, MD;  Location: [facility];  Service: Cardiovascular;  Laterality: N/A;  RCA and CIRCULFLEX   LEFT HEART CATH AND CORONARY ANGIOGRAPHY N/A 02/08/2020   Procedure: LEFT HEART CATH AND CORONARY ANGIOGRAPHY;  Surgeon: Lorretta Harp, MD;  Location: [facility];  Service: Cardiovascular;  Laterality: N/A;   SHOULDER SURGERY     Right    Allergies  Allergies  Allergen Reactions   Ace Inhibitors Swelling    angioedema   Penicillins Rash    History of Present Illness    71 year old male with the above past medical history including CAD s/p NSTEMI, DES-mRCA and mCX in 9417, chronic systolic heart failure, ICM, dilation of ascending aorta, abdominal aortic ectasia, hypertension, hyperlipidemia, tobacco use, angioedema, and morbid obesity.  He was admitted to the hospital in August 2021 with NSTEMI.  Cardiac catheterization showed p-mLAD 50%, mLAD 50%, mRCA 95%, and midCx 99%.  He underwent successful PCI/DES to mid RCA and mid circumflex. Echocardiogram at the time showed EF 45 to 50%, mildly  decreased LV function, moderate LVH, G2 DD, normal RV, mild to moderate LAE, mild aortic sclerosis with no evidence of stenosis, dilation of the ascending aorta measuring 44 mm.  Follow-up CT showed stable ascending thoracic aorta, measuring 44 mm, and abdominal aortic ectasia measuring 2.6 cm. Annual imaging was recommended. He is not on ACE, ARB, or Entresto due to history of angioedema.  He was last seen in the office in March 08, 2020 and was stable from a cardiac standpoint.  He has not been seen in our office since that time.  He presents today for follow-up and for preoperative cardiac evaluation for removal of chronic perineal abscess at the request of Dr. Georgette Dover with [facility] surgery. He comes in today with multiple concerns.  He reports bilateral lower extremity edema over the past year, he is very concerned about his abscess and states it causes him much discomfort.  Additionally, he states he has an ongoing problem with swelling of his lip and tongue.  He states he is spoken to several doctors about this, lisinopril was previously discontinued, yet he continues to have issues with swelling, relieved with Benadryl.  He states he thinks he is allergic to strawberries or pineapple. Unfortunately, his EKG today shows new onset atrial fibrillation.  He is unaware of any prior history of atrial fibrillation.  He denies any symptoms concerning for angina, denies shortness of breath, palpitations, presyncope, syncope.  He is very disappointed that he will not be able to immediately proceed with his surgery.  Home Medications    Current Outpatient Medications  Medication Sig Dispense Refill   apixaban (ELIQUIS) 5 MG TABS tablet Take 1 tablet (5 mg total) by mouth 2 (two) times daily. 60 tablet 6   atorvastatin (LIPITOR) 40 MG tablet Take 2 tablets (80 mg total) by mouth daily. 180 tablet 2   carvedilol (COREG) 6.25 MG tablet TAKE ONE TABLET BY MOUTH TWICE DAILY WITH meals 180 tablet 1    nitroGLYCERIN (NITROSTAT) 0.4 MG SL tablet Place 1 tablet (0.4 mg total) under the tongue every 5 (five) minutes x 3 doses as needed for chest pain. 25 tablet 7   potassium chloride SA (KLOR-CON) 20 MEQ tablet TAKE ONE TABLET BY MOUTH ONCE DAILY 90 tablet 1   amLODipine (NORVASC) 5 MG tablet Take 1 tablet (5 mg total) by mouth daily. 90 tablet 3   furosemide (LASIX) 40 MG tablet Take 1 tablet (40 mg total) by mouth daily. 90 tablet 3   No current facility-administered medications for this visit.     Review of Systems    He denies chest pain, palpitations, dyspnea, pnd, orthopnea, n, v, dizziness, syncope, weight gain, or early satiety. All other systems reviewed and are otherwise negative except as noted above.   Physical Exam    VS:  BP 128/80 (BP Location: Left Arm, Patient Position: Sitting, Cuff Size: Normal)    Pulse (!) 104    Ht 5' 5.5" (1.664 m)    Wt 222 lb (100.7 kg)    BMI 36.38 kg/m  GEN: Well nourished, well developed, in no acute distress. HEENT: normal. Neck: Supple, no JVD, carotid bruits, or masses. Cardiac: Irregularly irregular, no murmurs, rubs, or gallops. No clubbing, cyanosis, 2+ pitting bilatera lower extremity edema.  Radials/DP/PT 2+ and equal bilaterally.  Respiratory:  Respirations regular and unlabored, clear to auscultation bilaterally. GI: Obese, soft, nontender, nondistended, BS + x 4. MS: no deformity or atrophy. Skin: warm and dry, no rash. Neuro:  Strength and sensation are intact. Psych: Normal affect.  Accessory Clinical Findings    ECG personally reviewed by me today - Atrial fibrillation, 93 bpm, LAD, LBBB.  Lab Results  Component Value Date   WBC 8.6 02/10/2020   HGB 13.1 02/10/2020   HCT 39.9 02/10/2020   MCV 84.7 02/10/2020   PLT 231 02/10/2020   Lab Results  Component Value Date   CREATININE 0.80 06/12/2020   BUN 12 04/19/2020   NA 141 04/19/2020   K 4.4 04/19/2020   CL 104 04/19/2020   CO2 26 04/19/2020   Lab Results   Component Value Date   ALT 16 04/19/2020   AST 14 04/19/2020   ALKPHOS 99 04/19/2020   BILITOT 0.2 04/19/2020   Lab Results  Component Value Date   CHOL 128 04/19/2020   HDL 44 04/19/2020   LDLCALC 68 04/19/2020   TRIG 83 04/19/2020   CHOLHDL 2.9 04/19/2020    Lab Results  Component Value Date   HGBA1C 5.3 02/07/2020    Assessment & Plan   1. New onset atrial fibrillation: EKG today shows atrial fibrillation, 93 bpm.  No prior documented history.  CHA2DS2VASc =4. Unfortunately, this will delay his surgical procedure.  He does have some bilateral 2+ pitting lower extremity edema which she states has been present for over a year. We had a long conversation about atrial fibrillation, the risk of stroke without anticoagulation, rate control and the possibility of DCCV.  Through shared decision-making, he is agreeable to start Eliquis 5 mg  twice daily.  Samples provided today. I emphasized to him the importance of adherence to Eliquis.  We will repeat echo, check labs (BMET, CBC, TSH, Mg), start Eliquis, and have him return in 3 weeks to discuss possible DCCV if he is still in A. Fib.  Given BLE edema, will increase Lasix to 40 mg daily, decrease amlodipine to 5 mg daily.  If blood pressure remains elevated > 130/80, could consider increasing carvedilol if HR will allow.    2. CAD: S/p NSTEMI/DES-mRCA, LCx in 2021. Echo at the time showed EF 45-50%, mildly decreased LV function, moderate LVH, G2 DD, normal RV, mild to moderate LAE, mild aortic sclerosis with no evidence of stenosis, dilation of the ascending aorta measuring 44 mm. Stable with no anginal symptoms. No indication for ischemic evaluation.  We will stop aspirin, Brilinta in the setting of new Eliquis and given DESx2 was greater than 1 year ago.  Continue amlodipine, carvedilol, Lipitor, and Lasix as above.  3. Chronic systolic heart failure: Most recent echo as above.  BLE 2+ pitting edema as above.  Increase Lasix as above.  Repeat  echo pending, continue current medications as above.   4. Dilation of thoracic aorta/abdominal aortic ectasia: CTA in 01/2020 showed stable ascending thoracic aorta, measuring 44 mm, and abdominal aortic ectasia measuring 2.6 cm. Annual imaging was recommended. He is due for routine repeat CTA for continued monitoring, will place order.   5. Hypertension: BP well controlled. Continue antihypertensive regimen as above.   6. Hyperlipidemia: LDL was 68 in 05/2020.  He is not fasting today.  Consider repeat lipids at follow-up appointment.  Continue statin.  7. Tobacco use: He continues to smoke. Full cessation advised.   8. Preoperative cardiac clearance: Unfortunately, Saralyn Pilar patient is a new onset atrial fibrillation as above.  Based on ACC/AHA guidelines, patient would not be at acceptable risk for the planned procedure without further cardiovascular testing. I will route this recommendation to the requesting party via Epic fax function.  9. Disposition: Follow-up in 3 weeks.   Lenna Sciara, NP 07/17/2021, 4:30 PM

## 2021-07-17 ENCOUNTER — Ambulatory Visit (HOSPITAL_BASED_OUTPATIENT_CLINIC_OR_DEPARTMENT_OTHER): Payer: Medicare HMO | Admitting: Nurse Practitioner

## 2021-07-17 ENCOUNTER — Encounter (HOSPITAL_BASED_OUTPATIENT_CLINIC_OR_DEPARTMENT_OTHER): Payer: Self-pay | Admitting: Nurse Practitioner

## 2021-07-17 ENCOUNTER — Other Ambulatory Visit: Payer: Self-pay

## 2021-07-17 VITALS — BP 128/80 | HR 104 | Ht 65.5 in | Wt 222.0 lb

## 2021-07-17 DIAGNOSIS — E785 Hyperlipidemia, unspecified: Secondary | ICD-10-CM

## 2021-07-17 DIAGNOSIS — I2510 Atherosclerotic heart disease of native coronary artery without angina pectoris: Secondary | ICD-10-CM

## 2021-07-17 DIAGNOSIS — I7121 Aneurysm of the ascending aorta, without rupture: Secondary | ICD-10-CM

## 2021-07-17 DIAGNOSIS — I5022 Chronic systolic (congestive) heart failure: Secondary | ICD-10-CM | POA: Diagnosis not present

## 2021-07-17 DIAGNOSIS — R6889 Other general symptoms and signs: Secondary | ICD-10-CM | POA: Diagnosis not present

## 2021-07-17 DIAGNOSIS — I4891 Unspecified atrial fibrillation: Secondary | ICD-10-CM | POA: Diagnosis not present

## 2021-07-17 DIAGNOSIS — Z720 Tobacco use: Secondary | ICD-10-CM

## 2021-07-17 DIAGNOSIS — I77810 Thoracic aortic ectasia: Secondary | ICD-10-CM | POA: Diagnosis not present

## 2021-07-17 DIAGNOSIS — Z01810 Encounter for preprocedural cardiovascular examination: Secondary | ICD-10-CM | POA: Diagnosis not present

## 2021-07-17 DIAGNOSIS — I10 Essential (primary) hypertension: Secondary | ICD-10-CM | POA: Diagnosis not present

## 2021-07-17 MED ORDER — APIXABAN 5 MG PO TABS: 5.0000 mg | ORAL_TABLET | Freq: Two times a day (BID) | ORAL | 6 refills | Status: DC

## 2021-07-17 MED ORDER — AMLODIPINE BESYLATE 5 MG PO TABS: 5.0000 mg | ORAL_TABLET | Freq: Every day | ORAL | 3 refills | Status: DC

## 2021-07-17 MED ORDER — FUROSEMIDE 40 MG PO TABS: 40.0000 mg | ORAL_TABLET | Freq: Every day | ORAL | 3 refills | Status: DC

## 2021-07-17 NOTE — Patient Instructions (Addendum)
Medication Instructions:  Your physician has recommended you make the following change in your medication:   STOP Aspirin  STOP Brilinta  START Eliquis 5mg  twice a day  CHANGE Furosemide (Lasix) to 40mg  daily  REDUCE Amlodipine to 5mg  daily  *If you need a refill on your cardiac medications before your next appointment, please call your pharmacy*   Lab Work: Your physician recommends that you return for lab work today: BMP, magnesium, TSH, CBC   If you have labs (blood work) drawn today and your tests are completely normal, you will receive your results only by: [place] (if you have MyChart) OR A paper copy in the mail If you have any lab test that is abnormal or we need to change your treatment, we will call you to review the results.   Testing/Procedures: Your physician has requested that you have an echocardiogram. Echocardiography is a painless test that uses sound waves to create images of your heart. It provides your doctor with information about the size and shape of your heart and how well your hearts chambers and valves are working. This procedure takes approximately one hour. There are no restrictions for this procedure.   Cardiac CT scanning, (CAT scanning), is a noninvasive, special x-ray that produces cross-sectional images of the body using x-rays and a computer. CT scans help physicians diagnose and treat medical conditions. For some CT exams, a contrast material is used to enhance visibility in the area of the body being studied. CT scans provide greater clarity and reveal more details than regular x-ray exams.  Follow-Up: At [facility], you and your health needs are our priority.  As part of our continuing mission to provide you with exceptional heart care, we have created designated Provider Care Teams.  These Care Teams include your primary Cardiologist (physician) and Advanced Practice Providers (APPs -  Physician Assistants and Nurse Practitioners) who  all work together to provide you with the care you need, when you need it.  We recommend signing up for the patient portal called "MyChart".  Sign up information is provided on this After Visit Summary.  MyChart is used to connect with patients for Virtual Visits (Telemedicine).  Patients are able to view lab/test results, encounter notes, upcoming appointments, etc.  Non-urgent messages can be sent to your provider as well.   To learn more about what you can do with MyChart, go to [email or web address].    Your next appointment:   3 week(s)  The format for your next appointment:   In Person  Provider:   Sherren Mocha, MD or Advanced Practice Provider    Other Instructions  Kardia for monitoring of EKGs at home: You can look into the [facility] device by [employer].This device is purchased by you and it connects to an application you download to your smart phone.  It can detect abnormal heart rhythms and alert you to contact your doctor for further evaluation. The device is approximately $90 and the phone application is free.  The web site is:  https://www.[employer]ivecor.com

## 2021-07-18 ENCOUNTER — Telehealth (HOSPITAL_BASED_OUTPATIENT_CLINIC_OR_DEPARTMENT_OTHER): Payer: Self-pay

## 2021-07-18 ENCOUNTER — Telehealth (HOSPITAL_BASED_OUTPATIENT_CLINIC_OR_DEPARTMENT_OTHER): Payer: Self-pay | Admitting: Nurse Practitioner

## 2021-07-18 LAB — CBC
Hematocrit: 43.7 % (ref 37.5–51.0)
Hemoglobin: 14.9 g/dL (ref 13.0–17.7)
MCH: 29.2 pg (ref 26.6–33.0)
MCHC: 34.1 g/dL (ref 31.5–35.7)
MCV: 86 fL (ref 79–97)
Platelets: 311 10*3/uL (ref 150–450)
RBC: 5.11 x10E6/uL (ref 4.14–5.80)
RDW: 13.1 % (ref 11.6–15.4)
WBC: 9.3 10*3/uL (ref 3.4–10.8)

## 2021-07-18 LAB — BASIC METABOLIC PANEL
BUN/Creatinine Ratio: 18 (ref 10–24)
BUN: 12 mg/dL (ref 8–27)
CO2: 25 mmol/L (ref 20–29)
Calcium: 9.5 mg/dL (ref 8.6–10.2)
Chloride: 103 mmol/L (ref 96–106)
Creatinine, Ser: 0.66 mg/dL — ABNORMAL LOW (ref 0.76–1.27)
Glucose: 100 mg/dL — ABNORMAL HIGH (ref 70–99)
Potassium: 3.9 mmol/L (ref 3.5–5.2)
Sodium: 143 mmol/L (ref 134–144)
eGFR: 102 mL/min/{1.73_m2} (ref >59)

## 2021-07-18 LAB — TSH: TSH: 1.49 u[IU]/mL (ref 0.450–4.500)

## 2021-07-18 LAB — MAGNESIUM: Magnesium: 1.9 mg/dL (ref 1.6–2.3)

## 2021-07-18 NOTE — Telephone Encounter (Signed)
Called pharmacy with updated coupon information. Also called the patient with the same update!

## 2021-07-18 NOTE — Telephone Encounter (Signed)
New Message:    Patient was given a Coupon yesterday when he was here to see Raquel Sarna. It was to get a free 30 day trial for Eliquis.  The card had expired.

## 2021-07-18 NOTE — Telephone Encounter (Signed)
Called patient back to let him know that pharmacy was still having issues processing the coupon. He does not want it sent to another pharmacy and is willing to pay the $37 dollars for the prescription!

## 2021-07-22 ENCOUNTER — Ambulatory Visit (HOSPITAL_BASED_OUTPATIENT_CLINIC_OR_DEPARTMENT_OTHER)
Admission: RE | Admit: 2021-07-22 | Discharge: 2021-07-22 | Disposition: A | Discharge status: Home / Self Care | Payer: Medicare HMO | Source: Ambulatory Visit | Attending: Family | Admitting: Family

## 2021-07-22 ENCOUNTER — Ambulatory Visit (INDEPENDENT_AMBULATORY_CARE_PROVIDER_SITE_OTHER): Payer: Medicare HMO

## 2021-07-22 ENCOUNTER — Other Ambulatory Visit: Payer: Self-pay

## 2021-07-22 DIAGNOSIS — I4891 Unspecified atrial fibrillation: Secondary | ICD-10-CM | POA: Diagnosis not present

## 2021-07-22 DIAGNOSIS — N281 Cyst of kidney, acquired: Secondary | ICD-10-CM | POA: Diagnosis not present

## 2021-07-22 DIAGNOSIS — I7121 Aneurysm of the ascending aorta, without rupture: Secondary | ICD-10-CM | POA: Insufficient documentation

## 2021-07-22 DIAGNOSIS — I712 Thoracic aortic aneurysm, without rupture, unspecified: Secondary | ICD-10-CM | POA: Diagnosis not present

## 2021-07-22 DIAGNOSIS — I77810 Thoracic aortic ectasia: Secondary | ICD-10-CM | POA: Insufficient documentation

## 2021-07-22 LAB — ECHOCARDIOGRAM COMPLETE
AR max vel: 2.86 cm2
AV Area VTI: 3.02 cm2
AV Area mean vel: 2.82 cm2
AV Mean grad: 4.3 mmHg
AV Peak grad: 8.3 mmHg
Ao pk vel: 1.44 m/s
Area-P 1/2: 5.34 cm2
MV M vel: 2.67 m/s
MV Peak grad: 28.5 mmHg

## 2021-07-22 MED ORDER — IOHEXOL 350 MG/ML SOLN
85.0000 mL | Freq: Once | INTRAVENOUS | Status: AC | PRN
  Administered 2021-07-22: 85 mL via INTRAVENOUS

## 2021-07-25 ENCOUNTER — Telehealth (HOSPITAL_BASED_OUTPATIENT_CLINIC_OR_DEPARTMENT_OTHER): Payer: Self-pay

## 2021-07-25 ENCOUNTER — Encounter (HOSPITAL_BASED_OUTPATIENT_CLINIC_OR_DEPARTMENT_OTHER): Payer: Self-pay

## 2021-07-25 NOTE — Telephone Encounter (Addendum)
Results called to patient who endorses understanding and is looking forward to his upcoming appointment to regulate his medications and hopefully get clearance for his procedure!      ----- Message from Loel Dubonnet, NP sent at 07/22/2021  4:29 PM EST ----- Echocardiogram shows mildly reduced heart pumping function 40-45%. Slightly reduced from previous. Wall motion abnormalities similar compared to previous. Lung pressures mildly elevated. Bilateral atria severely dilated. Mild dilation of ascending aorta 55mm improved compared to prior - repeat echo in 1 year for monitoring.   Severe dilation of atria makes restoration of normal sinus rhythm potentially difficult. He would benefit from optimization of medications to treat heart failure. Will discuss at upcoming follow up 08/05/21.

## 2021-07-28 ENCOUNTER — Other Ambulatory Visit: Payer: Self-pay | Admitting: Internal Medicine

## 2021-07-28 ENCOUNTER — Other Ambulatory Visit: Payer: Self-pay | Admitting: Cardiovascular Disease

## 2021-07-31 ENCOUNTER — Telehealth (HOSPITAL_BASED_OUTPATIENT_CLINIC_OR_DEPARTMENT_OTHER): Payer: Self-pay | Admitting: Nurse Practitioner

## 2021-07-31 NOTE — Telephone Encounter (Signed)
Pt c/o medication issue:  1. Name of Medication: furosemide (LASIX) 20 MG tablet  2. How are you currently taking this medication (dosage and times per day)? Patient taking 40 mg daily  3. Are you having a reaction (difficulty breathing--STAT)?   4. What is your medication issue? Patient not sure what dosage to get refilled. Patient only has 3 days worth of medication left if he takes the 40 mg dosage. He comes in to see the NP 08/05/21.  Please advise

## 2021-07-31 NOTE — Telephone Encounter (Signed)
Spoke with pharmacy - they confirmed that Lasix 40mg  QD was on their list of medications but had not been filled correctly. They apologized for error and confirmed that 40mg  dosing will go in his next pill pack.   Contacted patient. He verbalized understanding and was appreciative of the call.  Loel Dubonnet, NP

## 2021-08-03 NOTE — Progress Notes (Addendum)
Cardiology Office Note:    Date:  08/05/2021   ID:  Connor, Martinez [birth year], MRN [id number]  PCP:  [name], MD   [facility] Providers Cardiologist:  Sherren Mocha, MD Click to update primary MD,subspecialty MD or APP then REFRESH:1}    Referring MD: [name], MD   Chief Complaint: 3 week follow-up atrial fibrillation and acute CHF  History of Present Illness:    Connor Martinez is a 71 y.o. male with a hx of CAD s/p NSTEMI, DES to [facility] and mCx in 2021, chronic HFrEF, ICM, ascending aortic aneurysm, HTN, abnormal aortic ectasia, hyperlipidemia, tobacco use, angioedema, and morbid obesity.  In August 2021, he was admitted with NSTEMI. Cardiac catheterization revealed p-mLAD 50%, mLAD 50%, mRCA 95%, and midCx 99%. He underwent successful PCI/DES to mid RCA and mid circumflex. 12 month DAPT was recommended. Echo revealed LVEF 45-50%, moderate LVH, G2DD, normal RV, mild to moderate LAE, mild aortic sclerosis with no evidence of stenosis, dilation of the ascending aorta measuring 44 mm. Follow-up CT showed stable ascending aorta measuring 44 mm, and abdominal aortic ectasia measuring 2.6 cm. Annual imaging recommended. ACE-I/ARB/ARNI not initiated due to history of angioedema. He has not followed up on a consistent basis since that time.  He was last seen in our office by Diona Browner, PA on 07/17/21 for preoperative evaluation for perianal abscess. Unfortunately, he was found to be in new onset atrial fibrillation at 93 bpm. He also reported one year of bilateral 2+ pitting lower extremity edema. Aspirin and Brilinta were stopped in the setting of DES > 1 year and starting Eliqius for a fib. He was additionally started on furosemide 40 mg daily. CTA 07/22/21 revealed stable ascending aortic aneurysm at 4.1 cm. Echo revealed slightly reduced pumping function at 40-45%, down from previous of 45-50%, wall motion abnormalities similar compared to previous, mildly elevated lung  pressures, severely dilated bilateral atria. Recommendations that severe dilation of atria make restoration of normal sinus rhythm potentially difficult and to follow-up in 3 weeks for optimization of heart failure medications.   Today, he is here alone. He reports home heart rate not > 90 bpm. Feels "bleeps" in his chest about one time per week. He denies chest pain, shortness of breath, early satiety, fatigue, melena, hematuria, hemoptysis, diaphoresis, weakness, presyncope, syncope, orthopnea, and PND. Has bilateral 2+ pitting edema in lower extremities. He states he feels "great" compared to last office visit on 1/26.  He does not feel like his lower extremity edema has improved much. Perianal abscess is most bothersome to him and he would like to proceed with surgery. Prior to onset of pain in the area of the perianal abscess approximately 2 months ago, he was walking on the treadmill for 1 mile several days per week without chest pain or shortness of breath. He has multiple questions about the potential causes of a fib and treatment options. He denies snoring or daytime somnolence. Denies alcohol use for many years.  He can achieve greater than 4 METS activity without difficulty. According to the Revised Cardiac Risk Index (RCRI), his Perioperative Risk of Major Cardiac Event is (%): 0.9 His Functional Capacity in METs is: 5.07 according to the Duke Activity Status Index (DASI).  He has not been monitoring weight on a consistent basis and has not been limiting fluid intake.   Past Medical History:  Diagnosis Date   Angioedema    BPV (benign positional vertigo)    Hemorrhoids  Hyperlipidemia    Sciatica    MRI with disc herniations L3-4, L4-5 s/p epidural injections by Dr [name]    Past Surgical History:  Procedure Laterality Date   BACK SURGERY     CORONARY STENT INTERVENTION N/A 02/08/2020   Procedure: CORONARY STENT INTERVENTION;  Surgeon: Lorretta Harp, MD;  Location: [place] [facility];  Service: Cardiovascular;  Laterality: N/A;  RCA and CIRCULFLEX   LEFT HEART CATH AND CORONARY ANGIOGRAPHY N/A 02/08/2020   Procedure: LEFT HEART CATH AND CORONARY ANGIOGRAPHY;  Surgeon: Lorretta Harp, MD;  Location: [place] [facility];  Service: Cardiovascular;  Laterality: N/A;   SHOULDER SURGERY     Right    Current Medications: Current Meds  Medication Sig   apixaban (ELIQUIS) 5 MG TABS tablet Take 1 tablet (5 mg total) by mouth 2 (two) times daily.   atorvastatin (LIPITOR) 40 MG tablet TAKE TWO TABLETS BY MOUTH DAILY   furosemide (LASIX) 40 MG tablet Take 1 tablet (40 mg total) by mouth daily.   nitroGLYCERIN (NITROSTAT) 0.4 MG SL tablet Place 1 tablet (0.4 mg total) under the tongue every 5 (five) minutes x 3 doses as needed for chest pain.   potassium chloride SA (KLOR-CON) 20 MEQ tablet TAKE ONE TABLET BY MOUTH ONCE DAILY   [DISCONTINUED] amLODipine (NORVASC) 5 MG tablet Take 1 tablet (5 mg total) by mouth daily.   [DISCONTINUED] carvedilol (COREG) 6.25 MG tablet TAKE ONE TABLET BY MOUTH TWICE DAILY with meals     Allergies:   Ace inhibitors and Penicillins   Social History   Socioeconomic History   Marital status: Single    Spouse name: Not on file   Number of children: Not on file   Years of education: Not on file   Highest education level: Not on file  Occupational History   Not on file  Tobacco Use   Smoking status: Every Day   Smokeless tobacco: Never  Vaping Use   Vaping Use: Never used  Substance and Sexual Activity   Alcohol use: Not Currently    Comment: infrequent   Drug use: Not Currently    Types: Marijuana    Comment: occasional   Sexual activity: Not on file  Other Topics Concern   Not on file  Social History Narrative   Not on file   Social Determinants of Health   Financial Resource Strain: Low Risk    Difficulty of Paying Living Expenses: Not very hard  Food Insecurity: Not on file  Transportation Needs: Not on file  Physical  Activity: Not on file  Stress: Not on file  Social Connections: Not on file     Family History: The patient's family history includes Heart disease in his mother; Hepatitis C in his brother; Hypertension in his brother and mother; Stroke in his mother.  ROS:   Please see the history of present illness.     All other systems reviewed and are negative.  Labs/Other Studies Reviewed:    The following studies were reviewed today:  Echo 07/22/21   Left Ventricle: Left ventricular ejection fraction, by estimation, is 40  to 45%. The left ventricle has mildly decreased function. The left  ventricle demonstrates regional wall motion abnormalities. The left  ventricular internal cavity size was moderately dilated. There is moderate concentric left ventricular hypertrophy. Left ventricular diastolic function could not be evaluated due to atrial fibrillation. Left ventricular diastolic function could not  be evaluated.  LV Wall Scoring:  The mid and  distal lateral wall, inferior wall, and posterior wall are  hypokinetic.  Right Ventricle: The right ventricular size is normal. No increase in  right ventricular wall thickness. Right ventricular systolic function is  normal. There is mildly elevated pulmonary artery systolic pressure. The  tricuspid regurgitant velocity is 2.84 m/s, and with an assumed right atrial pressure of 3 mmHg, the estimated right ventricular systolic pressure is 81.4 mmHg.  Left Atrium: Left atrial size was severely dilated.  Right Atrium: Right atrial size was severely dilated.  Pericardium: There is no evidence of pericardial effusion.  Mitral Valve: The mitral valve is normal in structure. Mild mitral annular  calcification. Trivial mitral valve regurgitation.  Tricuspid Valve: The tricuspid valve is normal in structure. Tricuspid  valve regurgitation is trivial.  Aortic Valve: The aortic valve is tricuspid. Aortic valve regurgitation is  not visualized. Aortic  valve sclerosis is present, with no evidence of  aortic valve stenosis. Aortic valve mean gradient measures 4.3 mmHg.  Aortic valve peak gradient measures 8.3   mmHg. Aortic valve area, by VTI measures 3.02 cm.  Pulmonic Valve: The pulmonic valve was not well visualized. Pulmonic valve  regurgitation is not visualized.  Aorta: The aortic root is normal in size and structure. There is mild  dilatation of the ascending aorta, measuring 42 mm.  Venous: The inferior vena cava is normal in size with greater than 50%  respiratory variability, suggesting right atrial pressure of 3 mmHg.  IAS/Shunts: No atrial level shunt detected by color flow Doppler.   CTA Chest/ABD/Pelvis 07/22/21  1. Ascending thoracic aortic aneurysm measures 4.1 cm, similar in size. Recommend annual imaging followup by CTA or MRA. This recommendation follows 2010 ACCF/AHA/AATS/ACR/ASA/SCA/SCAI/SIR/STS/SVM Guidelines for the Diagnosis and Management of Patients with Thoracic Aortic Disease. Circulation. 2010; 121: G818-H631. Aortic aneurysm NOS (ICD10-I71.9) 2. Borderline infrarenal abdominal aortic aneurysm measures 3.0 cm, similar in size. Recommend follow-up ultrasound every 3 years. This recommendation follows ACR consensus guidelines: White Paper of the ACR Incidental Findings Committee II on Vascular Findings. J Am Coll Radiol 2013; 10:789-794. 3. Bilateral common iliac artery aneurysms measuring 2.1 cm on the right, and 2.5 cm on the left, remain similar in size. Continued attention on follow-up.   Nonvascular:   1. No acute findings in the abdomen or pelvis.  LHC 8/21  Mid RCA lesion is 95% stenosed. Mid Cx lesion is 99% stenosed. Lat 1st Diag lesion is 100% stenosed. Prox LAD to Mid LAD lesion is 50% stenosed. Mid LAD lesion is 50% stenosed. A drug-eluting stent was successfully placed using a SYNERGY XD 2.25X16. Post intervention, there is a 0% residual stenosis. A drug-eluting stent was successfully  placed using a SYNERGY XD 2.25X24. Post intervention, there is a 0% residual stenosis.   Recent Labs: 07/17/2021: BUN 12; Creatinine, Ser 0.66; Hemoglobin 14.9; Magnesium 1.9; Platelets 311; Potassium 3.9; Sodium 143; TSH 1.490   Recent Lipid Panel    Component Value Date/Time   CHOL 128 04/19/2020 0907   TRIG 83 04/19/2020 0907   HDL 44 04/19/2020 0907   CHOLHDL 2.9 04/19/2020 0907   CHOLHDL 4 10/25/2019 1442   VLDL 15.4 10/25/2019 1442   LDLCALC 68 04/19/2020 0907    Risk Assessment/Calculations:    CHA2DS2-VASc Score = 4 This indicates a 4.8% annual risk of stroke. The patient's score is based upon: CHF History: 1 HTN History: 1 Diabetes History: 0 Stroke History: 0 Vascular Disease History: 1 Age Score: 1 Gender Score: 0    Physical Exam:  VS:  BP (!) 142/86 (BP Location: Right Arm, Patient Position: Sitting, Cuff Size: Normal)    Pulse 93    Ht 5' 5.5" (1.664 m)    Wt 216 lb 9.6 oz (98.2 kg)    BMI 35.50 kg/m     Wt Readings from Last 3 Encounters:  08/05/21 216 lb 9.6 oz (98.2 kg)  07/17/21 222 lb (100.7 kg)  10/15/20 214 lb (97.1 kg)     GEN:  Well nourished, well developed in no acute distress HEENT: Normal NECK: No JVD; No carotid bruits CARDIAC: RRR, no murmurs, rubs, gallops RESPIRATORY:  Clear to auscultation without rales, wheezing or rhonchi  ABDOMEN: Soft, non-tender, non-distended MUSCULOSKELETAL:  No edema; No deformity. 2+ pedal pulses, equal bilaterally SKIN: Warm and dry NEUROLOGIC:  Alert and oriented x 3 PSYCHIATRIC:  Normal affect   EKG:  EKG is ordered today.  The ekg ordered today demonstrates atrial fibrillation at 93 bpm, LAD, LBBB, no acute change from previous   Diagnoses:    1. Persistent atrial fibrillation ([place])   2. Preoperative cardiovascular examination   3. Chronic anticoagulation   4. Tobacco abuse   5. Dilation of thoracic aorta ([place])   6. Coronary artery disease involving native coronary artery of native heart  without angina pectoris    Assessment and Plan:     Preoperative cardiac evaluation: He would like to proceed with surgery to relieve perianal abscess as this is more bothersome to him than symptoms of atrial fibrillation/CHF.  He can achieve greater than 4 METS activity without difficulty.  His RCRI for MACE is 0.9%. I advised that he is at slightly greater risk for complication due to atrial fibrillation and he chooses to proceed with surgery.  No additional cardiac testing is needed at this time. Will forward to Pharm D for input on holding Eliquis for upcoming surgery.   Chronic HFrEF: LVEF 40-45% by echo 1/31. He denies orthopnea, PND.  Has 2+ pitting edema bilateral lower extremities that he feels has not improved since last office visit 07/14/29. Otherwise, he appears euvolemic. Activity is limited at this time due to the location of his perianal abscess. Encouraged him to monitor daily weight and notify us for increase > 2 lbs in 24 hours or 5 lbs in one week. Encouraged fluid restriction of 64 oz daily, with majority of fluid intake being water.  He had angioedema on ACE inhibitor, so we will not initiate ACE/ARB/MRA/ARNI. Could consider initiation of SGLT2 inhibitor at next office visit if not cost prohibitive. We will increase carvedilol to 12.5 mg twice daily. Continue furosemide, carvedilol.   Leg edema:  He does not feel that leg edema has improved significantly on furosemide. He denies orthopnea, dyspnea, PND. Encouraged daily weights. Will d/c amlodipine to see if leg edema improves. Continue furosemide.   Persistent atrial fibrillation:  In a fib today at 93 bpm. Home monitoring reveals HR consistently between 80-90 bpm. He would like to proceed with surgery rather than pursuing cardioversion at this time. Would like to follow-up with A-fib clinic to discuss all options for management of atrial fibrillation as he has a lot of questions and would like time to consider all options. We will  increase carvedilol to 12.5 mg twice daily.  No concerns with bleeding on anticoagulation. Continue Eliquis.   CAD without angina: He denies chest pain, dyspnea, or other symptoms concerning for angina.  No indication for further ischemia evaluation.  Not on aspirin in the setting of DES >  1 year and anticoagulation for a fib.  Continue carvedilol, statin.   Hyperlipidemia LDL < 70:  LDL 68 10/21. Will plan to recheck at next visit. Continue atorvastatin.   Essential hypertension: Blood pressure is elevated today.  He does not monitor at home.  Will DC amlodipine and increase carvedilol to 12.5 mg twice daily.  Tobacco abuse: Still smokes 1/2 ppd. He states this is an improvement for him. Complete cessation advised.   Dilation of thoracic aorta: Measurement stable at 4.1 x 4.0 cm by CTA  07/22/21. Continue to follow with annual imaging.   Addendum: Our clinical pharmacist has reviewed the request for patient to hold Eliquis for upcoming procedure and the recommendations are as follows:  CHA2DS2-VASc Score = 4   This indicates a 4.8% annual risk of stroke. The patient's score is based upon: CHF History: 1 HTN History: 1 Diabetes History: 0 Stroke History: 0 Vascular Disease History: 1 Age Score: 1 Gender Score: 0    CrCl 113 Platelet count 311 Per office protocol, patient can hold Eliquis for 2 days prior to procedure.   Patient will not need bridging with Lovenox (enoxaparin) around procedure.   Disposition: 6-8 weeks with A Fib Clinic, 6 months with Dr. Burt Knack   Medication Adjustments/Labs and Tests Ordered: Current medicines are reviewed at length with the patient today.  Concerns regarding medicines are outlined above.  Orders Placed This Encounter  Procedures   Amb Referral to AFIB Clinic   EKG 12-Lead   Meds ordered this encounter  Medications   carvedilol (COREG) 12.5 MG tablet    Sig: Take 1 tablet (12.5 mg total) by mouth 2 (two) times daily with a meal.     Dispense:  180 tablet    Refill:  3    Needs return office visit (1)    Patient Instructions  Medication Instructions:  Your physician has recommended you make the following change in your medication:   Stop: Amlodipine   Change: Carvedilol 12.5mg  twice daily- you may take 2 of your 6.25mg  tablets until you use them up and I am sending in a new prescription for you!   *If you need a refill on your cardiac medications before your next appointment, please call your pharmacy*   Lab Work: None ordered today   Testing/Procedures: None ordered today    Follow-Up: At [facility], you and your health needs are our priority.  As part of our continuing mission to provide you with exceptional heart care, we have created designated Provider Care Teams.  These Care Teams include your primary Cardiologist (physician) and Advanced Practice Providers (APPs -  Physician Assistants and Nurse Practitioners) who all work together to provide you with the care you need, when you need it.  We recommend signing up for the patient portal called "MyChart".  Sign up information is provided on this After Visit Summary.  MyChart is used to connect with patients for Virtual Visits (Telemedicine).  Patients are able to view lab/test results, encounter notes, upcoming appointments, etc.  Non-urgent messages can be sent to your provider as well.   To learn more about what you can do with MyChart, go to [email or web address].    Your next appointment:   Please follow up in 6-8 weeks with the A. Fib Clinic    And  Follow up in 6 months with Dr. Burt Knack   Other Instructions The A. Fib clinic will reach out to you!    Recommend weighing daily and keeping a log. Please  call our office if you have weight gain of 2 pounds overnight or 5 pounds in 1 week.   Date  Time Weight                                               Signed, Emmaline Life, NP  08/05/2021 4:21 PM    [place]

## 2021-08-05 ENCOUNTER — Other Ambulatory Visit: Payer: Self-pay

## 2021-08-05 ENCOUNTER — Encounter (HOSPITAL_BASED_OUTPATIENT_CLINIC_OR_DEPARTMENT_OTHER): Payer: Self-pay | Admitting: Nurse Practitioner

## 2021-08-05 ENCOUNTER — Ambulatory Visit (HOSPITAL_BASED_OUTPATIENT_CLINIC_OR_DEPARTMENT_OTHER): Payer: Medicare HMO | Admitting: Nurse Practitioner

## 2021-08-05 VITALS — BP 142/86 | HR 93 | Ht 65.5 in | Wt 216.6 lb

## 2021-08-05 DIAGNOSIS — Z7901 Long term (current) use of anticoagulants: Secondary | ICD-10-CM

## 2021-08-05 DIAGNOSIS — I4819 Other persistent atrial fibrillation: Secondary | ICD-10-CM

## 2021-08-05 DIAGNOSIS — Z01810 Encounter for preprocedural cardiovascular examination: Secondary | ICD-10-CM

## 2021-08-05 DIAGNOSIS — I77810 Thoracic aortic ectasia: Secondary | ICD-10-CM | POA: Diagnosis not present

## 2021-08-05 DIAGNOSIS — I10 Essential (primary) hypertension: Secondary | ICD-10-CM

## 2021-08-05 DIAGNOSIS — I2510 Atherosclerotic heart disease of native coronary artery without angina pectoris: Secondary | ICD-10-CM | POA: Diagnosis not present

## 2021-08-05 DIAGNOSIS — Z720 Tobacco use: Secondary | ICD-10-CM

## 2021-08-05 MED ORDER — CARVEDILOL 12.5 MG PO TABS: 12.5000 mg | ORAL_TABLET | Freq: Two times a day (BID) | ORAL | 3 refills | Status: DC

## 2021-08-05 NOTE — Patient Instructions (Addendum)
Medication Instructions:  Your physician has recommended you make the following change in your medication:   Stop: Amlodipine   Change: Carvedilol 12.5mg  twice daily- you may take 2 of your 6.25mg  tablets until you use them up and I am sending in a new prescription for you!   *If you need a refill on your cardiac medications before your next appointment, please call your pharmacy*   Lab Work: None ordered today   Testing/Procedures: None ordered today    Follow-Up: At [facility], you and your health needs are our priority.  As part of our continuing mission to provide you with exceptional heart care, we have created designated Provider Care Teams.  These Care Teams include your primary Cardiologist (physician) and Advanced Practice Providers (APPs -  Physician Assistants and Nurse Practitioners) who all work together to provide you with the care you need, when you need it.  We recommend signing up for the patient portal called "MyChart".  Sign up information is provided on this After Visit Summary.  MyChart is used to connect with patients for Virtual Visits (Telemedicine).  Patients are able to view lab/test results, encounter notes, upcoming appointments, etc.  Non-urgent messages can be sent to your provider as well.   To learn more about what you can do with MyChart, go to [email or web address].    Your next appointment:   Please follow up in 6-8 weeks with the A. Fib Clinic    And  Follow up in 6 months with Dr. Burt Knack   Other Instructions The A. Fib clinic will reach out to you!    Recommend weighing daily and keeping a log. Please call our office if you have weight gain of 2 pounds overnight or 5 pounds in 1 week.   Date  Time Weight

## 2021-08-05 NOTE — Telephone Encounter (Signed)
Patient was found to be in new onset a fib on 07/17/21. Returned today for follow-up and would like to proceed with surgery. Would you please address his Eliquis guidelines?

## 2021-08-06 NOTE — Telephone Encounter (Signed)
Patient with diagnosis of atrial fibrillation on Eliquis for anticoagulation.    Procedure: chronic perianal abscess Date of procedure: TBD   CHA2DS2-VASc Score = 4   This indicates a 4.8% annual risk of stroke. The patient's score is based upon: CHF History: 1 HTN History: 1 Diabetes History: 0 Stroke History: 0 Vascular Disease History: 1 Age Score: 1 Gender Score: 0    CrCl 113 Platelet count 311  Per office protocol, patient can hold Eliquis for 2 days prior to procedure.   Patient will not need bridging with Lovenox (enoxaparin) around procedure.

## 2021-08-06 NOTE — Telephone Encounter (Signed)
Since patient was seen by provider requesting anticoag input below, no need to route back to preop APP pool as separate APP input not needed at this time. Will route back to requesting provider to review and finalize clearance recs to requesting party.

## 2021-08-26 ENCOUNTER — Other Ambulatory Visit: Payer: Self-pay

## 2021-08-26 ENCOUNTER — Encounter (HOSPITAL_COMMUNITY): Payer: Self-pay | Admitting: Surgery

## 2021-08-26 NOTE — Anesthesia Preprocedure Evaluation (Addendum)
Anesthesia Evaluation  Patient identified by MRN, date of birth, ID band Patient awake    Reviewed: Allergy & Precautions, NPO status , Patient's Chart, lab work & pertinent test results  History of Anesthesia Complications Negative for: history of anesthetic complications  Airway Mallampati: III  TM Distance: >3 FB Neck ROM: Full    Dental  (+) Chipped, Dental Advisory Given   Pulmonary Current Smoker,    Pulmonary exam normal        Cardiovascular hypertension, Pt. on home beta blockers + CAD and + Cardiac Stents  Normal cardiovascular exam+ dysrhythmias Atrial Fibrillation  Rhythm:Irregular Rate:Normal  Echo 07/22/21: 1. Left ventricular ejection fraction, by estimation, is 40 to 45%. The  left ventricle has mildly decreased function. The left ventricle  demonstrates regional wall motion abnormalities (see scoring  diagram/findings for description). The left ventricular  internal cavity size was moderately dilated. There is moderate concentric  left ventricular hypertrophy. Left ventricular diastolic function could  not be evaluated. There is hypokinesis of the left ventricular, entire  inferolateral wall and inferior  wall.  2. Right ventricular systolic function is normal. The right ventricular  size is normal. There is mildly elevated pulmonary artery systolic  pressure. The estimated right ventricular systolic pressure is 10.2 mmHg.  3. Left atrial size was severely dilated.  4. Right atrial size was severely dilated.  5. The mitral valve is normal in structure. Trivial mitral valve  regurgitation.  6. The aortic valve is tricuspid. Aortic valve regurgitation is not  visualized. Aortic valve sclerosis is present, with no evidence of aortic  valve stenosis.  7. There is mild dilatation of the ascending aorta, measuring 42 mm.  8. The inferior vena cava is normal in size with greater than 50%  respiratory  variability, suggesting right atrial pressure of 3 mmHg.    Neuro/Psych negative neurological ROS     GI/Hepatic negative GI ROS, Neg liver ROS,   Endo/Other  negative endocrine ROS  Renal/GU negative Renal ROS     Musculoskeletal negative musculoskeletal ROS (+)   Abdominal   Peds  Hematology negative hematology ROS (+)   Anesthesia Other Findings   Reproductive/Obstetrics                           Anesthesia Physical Anesthesia Plan  ASA: 3  Anesthesia Plan: General   Post-op Pain Management: Celebrex PO (pre-op)*   Induction: Intravenous  PONV Risk Score and Plan: 2 and Ondansetron  Airway Management Planned: LMA  Additional Equipment:   Intra-op Plan:   Post-operative Plan: Extubation in OR  Informed Consent: I have reviewed the patients History and Physical, chart, labs and discussed the procedure including the risks, benefits and alternatives for the proposed anesthesia with the patient or authorized representative who has indicated his/her understanding and acceptance.     Dental advisory given  Plan Discussed with: CRNA and Anesthesiologist  Anesthesia Plan Comments: (PAT note by Karoline Caldwell, PA-C: Follows with cardiology for hx of CAD s/p NSTEMI, DES to [facility] and mCx in 2021, chronic HFrEF, ICM, ascending aortic aneurysm, HTN, abnormal aortic ectasia, hyperlipidemia. He was seen by cardiology 07/17/21 for preoperative evaluation for perianal abscess. Unfortunately, he was found to be in new onset atrial fibrillation at 93 bpm. He also reported one year of bilateral 2+ pitting lower extremity edema. Aspirin and Brilinta were stopped in the setting of DES > 1 year and starting Eliqius for a fib. He was additionally  started on furosemide 40 mg daily. CTA 07/22/21 revealed stable ascending aortic aneurysm at 4.1 cm. Echo revealed slightly reduced pumping function at 40-45%, down from previous of 45-50%, wall motion abnormalities similar  compared to previous, mildly elevated lung pressures, severely dilated bilateral atria. Recommendations that severe dilation of atria make restoration of normal sinus rhythm potentially difficult and to follow-up in 3 weeks for optimization of heart failure medications. Pt was seen in followup 08/05/21 and noted to be feeling much better compared with prior visit. He continues to have bilateral LE edema. His carvedilol was increased. He was cleared for surgery. Per note, "He would like to proceed with surgery to relieve perianal abscess as this is more bothersome to him than symptoms of atrial fibrillation/CHF.He can achieve greater than 4 METS activity without difficulty. His RCRI for MACE is0.9%. I advised that he is at slightly greater risk for complication due to atrial fibrillation and he chooses to proceed with surgery. No additional cardiac testing is needed at this time. Will forward to Pharm D for input on holding Eliquis for upcoming surgery."  Patient reports last dose Eliquis 08/24/2021.  History of angioedema.  Per PAT RN note, "Mr. Cogburn has a history of angioedema starts with half of mouth or half of tongue, this is not in associated of anything he with anything, Lisnopril is listed as a cause, patient is not sure if that is true. Mr. Curly Rim reports that is he is treated right away with Bendryl or Epinephrine it will not progress to full lip and full mouth."  Patient will need day of surgery labs and evaluation.  EKG 08/05/2021: A-fib.  Rate 93.  LAD.  Nonspecific intraventricular block.  Minimal voltage criteria for LVH.  Abnormal QRS-T angle, consider primary T wave abnormality.  No significant change from previous.  CTA chest abdomen pelvis 07/22/2021: IMPRESSION: Vascular:  1. Ascending thoracic aortic aneurysm measures 4.1 cm, similar in size. Recommend annual imaging followup by CTA or MRA. This recommendation follows 2010 ACCF/AHA/AATS/ACR/ASA/SCA/SCAI/SIR/STS/SVM  Guidelines for the Diagnosis and Management of Patients with Thoracic Aortic Disease. Circulation. 2010; 121: W580-D983. Aortic aneurysm NOS (ICD10-I71.9) 2. Borderline infrarenal abdominal aortic aneurysm measures 3.0 cm, similar in size. Recommend follow-up ultrasound every 3 years. This recommendation follows ACR consensus guidelines: White Paper of the ACR Incidental Findings Committee II on Vascular Findings. J Am Coll Radiol 2013; 10:789-794. 3. Bilateral common iliac artery aneurysms measuring 2.1 cm on the right, and 2.5 cm on the left, remain similar in size. Continued attention on follow-up.  Nonvascular:  1. No acute findings in the abdomen or pelvis.  Echo 07/22/21: 1. Left ventricular ejection fraction, by estimation, is 40 to 45%. The  left ventricle has mildly decreased function. The left ventricle  demonstrates regional wall motion abnormalities (see scoring  diagram/findings for description). The left ventricular  internal cavity size was moderately dilated. There is moderate concentric  left ventricular hypertrophy. Left ventricular diastolic function could  not be evaluated. There is hypokinesis of the left ventricular, entire  inferolateral wall and inferior  wall.  2. Right ventricular systolic function is normal. The right ventricular  size is normal. There is mildly elevated pulmonary artery systolic  pressure. The estimated right ventricular systolic pressure is 38.2 mmHg.  3. Left atrial size was severely dilated.  4. Right atrial size was severely dilated.  5. The mitral valve is normal in structure. Trivial mitral valve  regurgitation.  6. The aortic valve is tricuspid. Aortic valve regurgitation is not  visualized. Aortic valve sclerosis is present, with no evidence of aortic  valve stenosis.  7. There is mild dilatation of the ascending aorta, measuring 42 mm.  8. The inferior vena cava is normal in size with greater than 50%  respiratory  variability, suggesting right atrial pressure of 3 mmHg.   CTA Chest/ABD/Pelvis 07/22/21 1. Ascending thoracic aortic aneurysm measures 4.1 cm, similar in size. Recommend annual imaging followup by CTA or MRA. This recommendation follows 2010 ACCF/AHA/AATS/ACR/ASA/SCA/SCAI/SIR/STS/SVM Guidelines for the Diagnosis and Management of Patients with Thoracic Aortic Disease. Circulation. 2010; 121: C127-N170. Aortic aneurysm NOS (ICD10-I71.9) 2. Borderline infrarenal abdominal aortic aneurysm measures 3.0 cm, similar in size. Recommend follow-up ultrasound every 3 years. This recommendation follows ACR consensus guidelines: White Paper of the ACR Incidental Findings Committee II on Vascular Findings. J Am Coll Radiol 2013; 10:789-794. 3. Bilateral common iliac artery aneurysms measuring 2.1 cm on the right, and 2.5 cm on the left, remain similar in size. Continued attention on follow-up.  Nonvascular:  1. No acute findings in the abdomen or pelvis.  [facility] 8/21  Mid RCA lesion is 95% stenosed.  Mid Cx lesion is 99% stenosed.  Lat 1st Diag lesion is 100% stenosed.  Prox LAD to Mid LAD lesion is 50% stenosed.  Mid LAD lesion is 50% stenosed.  A drug-eluting stent was successfully placed using a SYNERGY XD 2.25X16.  Post intervention, there is a 0% residual stenosis.  A drug-eluting stent was successfully placed using a SYNERGY XD 2.25X24.  Post intervention, there is a 0% residual stenosis.  )      Anesthesia Quick Evaluation

## 2021-08-26 NOTE — Progress Notes (Addendum)
Mr. Connor Martinez denies chest pain or shortness of breath.  Patient denies having any s/s of Covid in his house nor any exposure to Covid.  ? ?PCP is Dr. Lanetta Inch burns.Cardiologist is Dr. Burt Knack. ?Mr Connor Martinez had a history of MI, has a new diagnosis of Afib, last dose of Eliquis  was 08/24/21. ? ?Mr. Connor Martinez has a history of angioedema starts with half of mouth or half of tongue, this is not in associated  of anything he with anything, Lisnopril is listed as a cause, patient is not sure if that is true. Mr. Connor Martinez reports that is he is treated right away with Bendryl or Epinephrine it will not progress to full lip and full mouth. it Mr. Connor Martinez to shower with antibacteria soap.  No nail polish, artificial or acrylic nails. Wear clean clothes, brush your teeth. ?Glasses, contact lens,dentures or partials may not be worn in the OR. If you need to wear them, please bring a case for glasses, do not wear contacts or bring a case, the hospital does not have contact cases, dentures or partials will have to be removed , make sure they are clean, we will provide a denture cup to put them in. You will need some one to drive you home and a responsible person over the age of 47 to stay with you for the first 24 hours after surgery. ` ? ?I asked anesthesiology PA-C to review.  ?

## 2021-08-26 NOTE — Progress Notes (Signed)
Anesthesia Chart Review: ?Same day workup ? ?Follows with cardiology for hx of CAD s/p NSTEMI, DES to [facility] and mCx in 2021, chronic HFrEF, ICM, ascending aortic aneurysm, HTN, abnormal aortic ectasia, hyperlipidemia. He was seen by cardiology 07/17/21 for preoperative evaluation for perianal abscess. Unfortunately, he was found to be in new onset atrial fibrillation at 93 bpm. He also reported one year of bilateral 2+ pitting lower extremity edema. Aspirin and Brilinta were stopped in the setting of DES > 1 year and starting Eliqius for a fib. He was additionally started on furosemide 40 mg daily. CTA 07/22/21 revealed stable ascending aortic aneurysm at 4.1 cm. Echo revealed slightly reduced pumping function at 40-45%, down from previous of 45-50%, wall motion abnormalities similar compared to previous, mildly elevated lung pressures, severely dilated bilateral atria. Recommendations that severe dilation of atria make restoration of normal sinus rhythm potentially difficult and to follow-up in 3 weeks for optimization of heart failure medications. Pt was seen in followup 08/05/21 and noted to be feeling much better compared with prior visit. He continues to have bilateral LE edema. His carvedilol was increased. He was cleared for surgery. Per note, "He would like to proceed with surgery to relieve perianal abscess as this is more bothersome to him than symptoms of atrial fibrillation/CHF.  He can achieve greater than 4 METS activity without difficulty.  His RCRI for MACE is 0.9%. I advised that he is at slightly greater risk for complication due to atrial fibrillation and he chooses to proceed with surgery.  No additional cardiac testing is needed at this time. Will forward to Pharm D for input on holding Eliquis for upcoming surgery." ? ?Patient reports last dose Eliquis 08/24/2021. ? ?History of angioedema.  Per PAT RN note, "Mr. Fedewa has a history of angioedema starts with half of mouth or half of tongue, this is  not in associated  of anything he with anything, Lisnopril is listed as a cause, patient is not sure if that is true. Mr. Curly Rim reports that is he is treated right away with Bendryl or Epinephrine it will not progress to full lip and full mouth." ? ?Patient will need day of surgery labs and evaluation. ? ?EKG 08/05/2021: A-fib.  Rate 93.  LAD.  Nonspecific intraventricular block.  Minimal voltage criteria for LVH.  Abnormal QRS-T angle, consider primary T wave abnormality.  No significant change from previous. ?  ?CTA chest abdomen pelvis 07/22/2021: ?IMPRESSION: ?Vascular: ?  ?1. Ascending thoracic aortic aneurysm measures 4.1 cm, similar in ?size. Recommend annual imaging followup by CTA or MRA. This ?recommendation follows 2010 ?ACCF/AHA/AATS/ACR/ASA/SCA/SCAI/SIR/STS/SVM Guidelines for the ?Diagnosis and Management of Patients with Thoracic Aortic Disease. ?Circulation. 2010; 121: Z610-R604. Aortic aneurysm NOS (ICD10-I71.9) ?2. Borderline infrarenal abdominal aortic aneurysm measures 3.0 cm, ?similar in size. Recommend follow-up ultrasound every 3 years. This ?recommendation follows ACR consensus guidelines: White Paper of the ?ACR Incidental Findings Committee II on Vascular Findings. J Am Coll ?Radiol 2013; 54:098-119. ?3. Bilateral common iliac artery aneurysms measuring 2.1 cm on the ?right, and 2.5 cm on the left, remain similar in size. Continued ?attention on follow-up. ?  ?Nonvascular: ?  ?1. No acute findings in the abdomen or pelvis. ? ?Echo 07/22/21: ? 1. Left ventricular ejection fraction, by estimation, is 40 to 45%. The  ?left ventricle has mildly decreased function. The left ventricle  ?demonstrates regional wall motion abnormalities (see scoring  ?diagram/findings for description). The left ventricular  ? internal cavity size was moderately dilated. There is moderate  concentric  ?left ventricular hypertrophy. Left ventricular diastolic function could  ?not be evaluated. There is hypokinesis of the  left ventricular, entire  ?inferolateral wall and inferior  ?wall.  ? 2. Right ventricular systolic function is normal. The right ventricular  ?size is normal. There is mildly elevated pulmonary artery systolic  ?pressure. The estimated right ventricular systolic pressure is 59.1 mmHg.  ? 3. Left atrial size was severely dilated.  ? 4. Right atrial size was severely dilated.  ? 5. The mitral valve is normal in structure. Trivial mitral valve  ?regurgitation.  ? 6. The aortic valve is tricuspid. Aortic valve regurgitation is not  ?visualized. Aortic valve sclerosis is present, with no evidence of aortic  ?valve stenosis.  ? 7. There is mild dilatation of the ascending aorta, measuring 42 mm.  ? 8. The inferior vena cava is normal in size with greater than 50%  ?respiratory variability, suggesting right atrial pressure of 3 mmHg.  ? ?CTA Chest/ABD/Pelvis 07/22/21 ?1. Ascending thoracic aortic aneurysm measures 4.1 cm, similar in ?size. Recommend annual imaging followup by CTA or MRA. This ?recommendation follows 2010 ?ACCF/AHA/AATS/ACR/ASA/SCA/SCAI/SIR/STS/SVM Guidelines for the ?Diagnosis and Management of Patients with Thoracic Aortic Disease. ?Circulation. 2010; 121: M384-Y659. Aortic aneurysm NOS (ICD10-I71.9) ?2. Borderline infrarenal abdominal aortic aneurysm measures 3.0 cm, ?similar in size. Recommend follow-up ultrasound every 3 years. This ?recommendation follows ACR consensus guidelines: White Paper of the ?ACR Incidental Findings Committee II on Vascular Findings. J Am Coll ?Radiol 2013; 93:570-177. ?3. Bilateral common iliac artery aneurysms measuring 2.1 cm on the ?right, and 2.5 cm on the left, remain similar in size. Continued ?attention on follow-up. ?  ?Nonvascular: ?  ?1. No acute findings in the abdomen or pelvis. ?  ?[place] 8/21 ?Mid RCA lesion is 95% stenosed. ?Mid Cx lesion is 99% stenosed. ?Lat 1st Diag lesion is 100% stenosed. ?Prox LAD to Mid LAD lesion is 50% stenosed. ?Mid LAD lesion is 50%  stenosed. ?A drug-eluting stent was successfully placed using a SYNERGY XD 2.25X16. ?Post intervention, there is a 0% residual stenosis. ?A drug-eluting stent was successfully placed using a SYNERGY XD 2.25X24. ?Post intervention, there is a 0% residual stenosis. ? ?Karoline Caldwell, PA-C ?[facility]/Anesthesiology ?Phone [phone] ?08/26/2021 1:45 PM ? ?

## 2021-08-27 ENCOUNTER — Other Ambulatory Visit: Payer: Self-pay

## 2021-08-27 ENCOUNTER — Ambulatory Visit (HOSPITAL_COMMUNITY): Payer: Medicare HMO | Admitting: Certified Registered Nurse Anesthetist

## 2021-08-27 ENCOUNTER — Ambulatory Visit (HOSPITAL_COMMUNITY)
Admission: RE | Admit: 2021-08-27 | Discharge: 2021-08-27 | Disposition: A | Discharge status: Home / Self Care | Payer: Medicare HMO | Attending: Surgery | Admitting: Surgery

## 2021-08-27 ENCOUNTER — Ambulatory Visit (HOSPITAL_BASED_OUTPATIENT_CLINIC_OR_DEPARTMENT_OTHER): Payer: Medicare HMO | Admitting: Certified Registered Nurse Anesthetist

## 2021-08-27 ENCOUNTER — Encounter (HOSPITAL_COMMUNITY): Payer: Self-pay | Admitting: Surgery

## 2021-08-27 ENCOUNTER — Encounter (HOSPITAL_COMMUNITY)
Admission: RE | Disposition: A | Discharge status: Home / Self Care | Payer: Self-pay | Source: Home / Self Care | Attending: Surgery

## 2021-08-27 DIAGNOSIS — Z955 Presence of coronary angioplasty implant and graft: Secondary | ICD-10-CM | POA: Diagnosis not present

## 2021-08-27 DIAGNOSIS — I509 Heart failure, unspecified: Secondary | ICD-10-CM | POA: Insufficient documentation

## 2021-08-27 DIAGNOSIS — I110 Hypertensive heart disease with heart failure: Secondary | ICD-10-CM | POA: Insufficient documentation

## 2021-08-27 DIAGNOSIS — I10 Essential (primary) hypertension: Secondary | ICD-10-CM | POA: Diagnosis not present

## 2021-08-27 DIAGNOSIS — L02215 Cutaneous abscess of perineum: Secondary | ICD-10-CM | POA: Insufficient documentation

## 2021-08-27 DIAGNOSIS — I4891 Unspecified atrial fibrillation: Secondary | ICD-10-CM | POA: Diagnosis not present

## 2021-08-27 DIAGNOSIS — R6889 Other general symptoms and signs: Secondary | ICD-10-CM | POA: Diagnosis not present

## 2021-08-27 DIAGNOSIS — I2510 Atherosclerotic heart disease of native coronary artery without angina pectoris: Secondary | ICD-10-CM

## 2021-08-27 DIAGNOSIS — A630 Anogenital (venereal) warts: Secondary | ICD-10-CM | POA: Diagnosis not present

## 2021-08-27 DIAGNOSIS — Z7901 Long term (current) use of anticoagulants: Secondary | ICD-10-CM | POA: Diagnosis not present

## 2021-08-27 DIAGNOSIS — Z7982 Long term (current) use of aspirin: Secondary | ICD-10-CM | POA: Diagnosis not present

## 2021-08-27 DIAGNOSIS — F17210 Nicotine dependence, cigarettes, uncomplicated: Secondary | ICD-10-CM | POA: Diagnosis not present

## 2021-08-27 DIAGNOSIS — K610 Anal abscess: Secondary | ICD-10-CM | POA: Diagnosis not present

## 2021-08-27 HISTORY — DX: Dyspnea, unspecified: R06.00

## 2021-08-27 HISTORY — DX: Atherosclerotic heart disease of native coronary artery without angina pectoris: I25.10

## 2021-08-27 HISTORY — DX: Essential (primary) hypertension: I10

## 2021-08-27 HISTORY — DX: Acute myocardial infarction, unspecified: I21.9

## 2021-08-27 HISTORY — DX: Unspecified atrial fibrillation: I48.91

## 2021-08-27 HISTORY — PX: INCISION AND DRAINAGE ABSCESS: SHX5864

## 2021-08-27 LAB — CBC
HCT: 43.7 % (ref 39.0–52.0)
Hemoglobin: 14.9 g/dL (ref 13.0–17.0)
MCH: 29.8 pg (ref 26.0–34.0)
MCHC: 34.1 g/dL (ref 30.0–36.0)
MCV: 87.4 fL (ref 80.0–100.0)
Platelets: 264 10*3/uL (ref 150–400)
RBC: 5 MIL/uL (ref 4.22–5.81)
RDW: 13.3 % (ref 11.5–15.5)
WBC: 7.8 10*3/uL (ref 4.0–10.5)
nRBC: 0 % (ref 0.0–0.2)

## 2021-08-27 LAB — COMPREHENSIVE METABOLIC PANEL
ALT: 18 U/L (ref 0–44)
AST: 16 U/L (ref 15–41)
Albumin: 3.7 g/dL (ref 3.5–5.0)
Alkaline Phosphatase: 84 U/L (ref 38–126)
Anion gap: 9 (ref 5–15)
BUN: 8 mg/dL (ref 8–23)
CO2: 25 mmol/L (ref 22–32)
Calcium: 9 mg/dL (ref 8.9–10.3)
Chloride: 105 mmol/L (ref 98–111)
Creatinine, Ser: 0.99 mg/dL (ref 0.61–1.24)
GFR, Estimated: >60 mL/min (ref >60)
Glucose, Bld: 121 mg/dL — ABNORMAL HIGH (ref 70–99)
Potassium: 3.6 mmol/L (ref 3.5–5.1)
Sodium: 139 mmol/L (ref 135–145)
Total Bilirubin: 1 mg/dL (ref 0.3–1.2)
Total Protein: 7.1 g/dL (ref 6.5–8.1)

## 2021-08-27 SURGERY — INCISION AND DRAINAGE, ABSCESS
Anesthesia: General | Site: Perineum

## 2021-08-27 MED ORDER — CHLORHEXIDINE GLUCONATE CLOTH 2 % EX PADS: 6.0000 | MEDICATED_PAD | Freq: Once | CUTANEOUS | Status: DC

## 2021-08-27 MED ORDER — ORAL CARE MOUTH RINSE: 15.0000 mL | Freq: Once | OROMUCOSAL | Status: AC

## 2021-08-27 MED ORDER — BUPIVACAINE HCL (PF) 0.25 % IJ SOLN
INTRAMUSCULAR | Status: DC | PRN
  Administered 2021-08-27: 10 mL

## 2021-08-27 MED ORDER — CHLORHEXIDINE GLUCONATE 0.12 % MT SOLN
15.0000 mL | Freq: Once | OROMUCOSAL | Status: AC
  Administered 2021-08-27: 15 mL via OROMUCOSAL
  Filled 2021-08-27: qty 15

## 2021-08-27 MED ORDER — DEXAMETHASONE SODIUM PHOSPHATE 10 MG/ML IJ SOLN
INTRAMUSCULAR | Status: DC | PRN
  Administered 2021-08-27: 10 mg via INTRAVENOUS

## 2021-08-27 MED ORDER — LIDOCAINE 2% (20 MG/ML) 5 ML SYRINGE
INTRAMUSCULAR | Status: DC | PRN
  Administered 2021-08-27: 100 mg via INTRAVENOUS

## 2021-08-27 MED ORDER — MIDAZOLAM HCL 2 MG/2ML IJ SOLN
INTRAMUSCULAR | Status: AC
  Filled 2021-08-27: qty 2

## 2021-08-27 MED ORDER — PROPOFOL 10 MG/ML IV BOLUS
INTRAVENOUS | Status: DC | PRN
  Administered 2021-08-27: 200 mg via INTRAVENOUS

## 2021-08-27 MED ORDER — CEFAZOLIN SODIUM-DEXTROSE 2-4 GM/100ML-% IV SOLN
2.0000 g | INTRAVENOUS | Status: AC
  Administered 2021-08-27: 2 g via INTRAVENOUS
  Filled 2021-08-27: qty 100

## 2021-08-27 MED ORDER — MIDAZOLAM HCL 2 MG/2ML IJ SOLN
INTRAMUSCULAR | Status: DC | PRN
  Administered 2021-08-27: 2 mg via INTRAVENOUS

## 2021-08-27 MED ORDER — FENTANYL CITRATE (PF) 250 MCG/5ML IJ SOLN
INTRAMUSCULAR | Status: AC
  Filled 2021-08-27: qty 5

## 2021-08-27 MED ORDER — BUPIVACAINE HCL (PF) 0.25 % IJ SOLN
INTRAMUSCULAR | Status: AC
  Filled 2021-08-27: qty 30

## 2021-08-27 MED ORDER — OXYCODONE HCL 5 MG PO TABS: 5.0000 mg | ORAL_TABLET | Freq: Four times a day (QID) | ORAL | 0 refills | Status: DC | PRN

## 2021-08-27 MED ORDER — ACETAMINOPHEN 500 MG PO TABS
1000.0000 mg | ORAL_TABLET | ORAL | Status: AC
  Administered 2021-08-27: 1000 mg via ORAL
  Filled 2021-08-27: qty 2

## 2021-08-27 MED ORDER — ONDANSETRON HCL 4 MG/2ML IJ SOLN
INTRAMUSCULAR | Status: DC | PRN
  Administered 2021-08-27: 4 mg via INTRAVENOUS

## 2021-08-27 MED ORDER — 0.9 % SODIUM CHLORIDE (POUR BTL) OPTIME
TOPICAL | Status: DC | PRN
  Administered 2021-08-27: 1000 mL

## 2021-08-27 MED ORDER — LACTATED RINGERS IV SOLN: INTRAVENOUS | Status: DC

## 2021-08-27 MED ORDER — PROPOFOL 10 MG/ML IV BOLUS
INTRAVENOUS | Status: AC
  Filled 2021-08-27: qty 20

## 2021-08-27 MED ORDER — FENTANYL CITRATE (PF) 250 MCG/5ML IJ SOLN
INTRAMUSCULAR | Status: DC | PRN
Start: 2021-08-27 — End: 2021-08-27
  Administered 2021-08-27 (×2): 25 ug via INTRAVENOUS
  Administered 2021-08-27: 50 ug via INTRAVENOUS

## 2021-08-27 SURGICAL SUPPLY — 30 items
BAG COUNTER SPONGE SURGICOUNT (BAG) ×2 IMPLANT
BLADE SURG 15 STRL LF DISP TIS (BLADE) IMPLANT
BLADE SURG 15 STRL SS (BLADE) ×2
CANISTER SUCT 3000ML PPV (MISCELLANEOUS) IMPLANT
COVER SURGICAL LIGHT HANDLE (MISCELLANEOUS) ×2 IMPLANT
DRAIN PENROSE .5X12 LATEX STL (DRAIN) ×1 IMPLANT
GAUZE 4X4 16PLY ~~LOC~~+RFID DBL (SPONGE) ×2 IMPLANT
GLOVE SURG ENC MOIS LTX SZ7 (GLOVE) ×2 IMPLANT
GLOVE SURG UNDER POLY LF SZ7.5 (GLOVE) ×2 IMPLANT
GOWN STRL REUS W/ TWL LRG LVL3 (GOWN DISPOSABLE) ×2 IMPLANT
GOWN STRL REUS W/TWL LRG LVL3 (GOWN DISPOSABLE) ×4
HEMOSTAT SURGICEL 2X14 (HEMOSTASIS) ×1 IMPLANT
KIT BASIN OR (CUSTOM PROCEDURE TRAY) ×2 IMPLANT
KIT SIGMOIDOSCOPE (SET/KITS/TRAYS/PACK) IMPLANT
KIT TURNOVER KIT B (KITS) ×2 IMPLANT
NEEDLE 22X1 1/2 (OR ONLY) (NEEDLE) ×1 IMPLANT
NS IRRIG 1000ML POUR BTL (IV SOLUTION) ×2 IMPLANT
PACK LITHOTOMY IV (CUSTOM PROCEDURE TRAY) ×2 IMPLANT
PAD ABD 8X10 STRL (GAUZE/BANDAGES/DRESSINGS) ×1 IMPLANT
PAD ARMBOARD 7.5X6 YLW CONV (MISCELLANEOUS) ×4 IMPLANT
PENCIL BUTTON HOLSTER BLD 10FT (ELECTRODE) IMPLANT
SURGILUBE 2OZ TUBE FLIPTOP (MISCELLANEOUS) ×2 IMPLANT
SUT ETHILON 3 0 PS 1 (SUTURE) ×1 IMPLANT
SUT VIC AB 3-0 SH 27 (SUTURE) ×2
SUT VIC AB 3-0 SH 27X BRD (SUTURE) IMPLANT
SYR BULB IRRIG 60ML STRL (SYRINGE) ×1 IMPLANT
SYR CONTROL 10ML LL (SYRINGE) ×1 IMPLANT
TOWEL GREEN STERILE (TOWEL DISPOSABLE) ×2 IMPLANT
TUBE CONNECTING 12X1/4 (SUCTIONS) ×1 IMPLANT
YANKAUER SUCT BULB TIP NO VENT (SUCTIONS) ×1 IMPLANT

## 2021-08-27 NOTE — Anesthesia Procedure Notes (Signed)
Procedure Name: LMA Insertion ?Date/Time: 08/27/2021 8:41 AM ?Performed by: Clearnce Sorrel, CRNA ?Pre-anesthesia Checklist: Patient identified, Emergency Drugs available, Suction available and Patient being monitored ?Patient Re-evaluated:Patient Re-evaluated prior to induction ?Oxygen Delivery Method: Circle System Utilized ?Preoxygenation: Pre-oxygenation with 100% oxygen ?Induction Type: IV induction ?Ventilation: Mask ventilation without difficulty ?LMA: LMA inserted ?LMA Size: 4.0 ?Number of attempts: 1 ?Airway Equipment and Method: Bite block ?Placement Confirmation: positive ETCO2 ?Tube secured with: Tape ?Dental Injury: Teeth and Oropharynx as per pre-operative assessment  ? ? ? ? ?

## 2021-08-27 NOTE — Anesthesia Postprocedure Evaluation (Signed)
Anesthesia Post Note ? ?Patient: Connor Martinez ? ?Procedure(s) Performed: INCISION AND DRAINAGE OF PERIANAL ABSCESS (Perineum) ? ?  ? ?Patient location during evaluation: PACU ?Anesthesia Type: General ?Level of consciousness: sedated ?Pain management: pain level controlled ?Vital Signs Assessment: post-procedure vital signs reviewed and stable ?Respiratory status: spontaneous breathing and respiratory function stable ?Cardiovascular status: stable ?Postop Assessment: no apparent nausea or vomiting ?Anesthetic complications: no ? ? ?No notable events documented. ? ?Last Vitals:  ?Vitals:  ? 08/27/21 0936 08/27/21 0944  ?BP: (!) 147/92 (!) 153/91  ?Pulse: 77 70  ?Resp: 16 18  ?Temp:  (!) 36.4 ?C  ?SpO2: 95% 95%  ?  ?Last Pain:  ?Vitals:  ? 08/27/21 0944  ?TempSrc:   ?PainSc: 0-No pain  ? ? ?  ?  ?  ?  ?  ?  ? ?Ellarie Picking DANIEL ? ? ? ? ?

## 2021-08-27 NOTE — Op Note (Signed)
Pre-op diagnosis:  Chronic right perineal abscess/ perianal wart ?Post-op diagnosis:  same ?Procedure:  Incision and drainage of chronic right perineal abscess/ excision of perianal wart ?Surgeon:  Maia Petties ?Anesthesia:  Gen LMA ?Indications:  Connor Martinez is a 71 y.o. male who was seen by Dr. Bobbye Morton in Dec 2021 for perineal pain. He was taking Brilinta and ASA81 for cardiac stents placed 02/2020 at [facility].  CT scan was obtained which showed a 5.2 x 2.2 cm rim enhancing fluid collection in the right perineum.  He was advised to be direct admitted for I&D but refused and never followed-up. ?  ?He contacted his PCP in Dec 2022, one year later, c/o an area of persistent swelling, pain, and drainage in the perineum, at the same location as last year.  He states that it occasionally drains bloody/white drainage when he is in the shower, but has not drained today.  He states there is no major pain unless he is sitting on it.  He denies fever.  He also complains about an anal wart that has been treated with cryotherapy but is still present. ? ?Description of procedure: The patient is brought to the operating room and placed in the supine position on operating room table.  After an adequate level of general anesthesia was obtained, the patient's legs were placed in the lithotomy position in yellowfin stirrups.  His perineum was prepped with Betadine and draped in sterile fashion.  A timeout was taken to ensure the proper patient and proper procedure.  Just to the right of his anus, there is a 1 cm diameter perianal wart.  We infiltrated this area with local anesthetic.  I excised the entire wart.  Hemostasis was obtained with cautery.  This was sent for pathologic examination.  We closed this wound with a running locking 3-0 Vicryl suture. ? ?I then turned my attention to the peroneal abscess.  He has a 5 cm long subcutaneous abscess that is palpable from the perineum running into the base of the scrotum.  We infiltrated  this area with local anesthetic.  I made a 1.5 cm incision in the perineum.  We dissected up into the abscess.  We evacuated a significant amount of purulent cloudy fluid.  We irrigated the wound thoroughly.  I packed some Surgicel deep into the abscess cavity.  A Penrose drain was placed into the abscess cavity.  3-0 Ethilon sutures were used to secure the drain.  No other abscesses were noted.  A dry dressing was applied.  He was extubated and brought to the recovery room in stable condition.  All sponge, instrument, and needle counts are correct. ? ?Imogene Burn. Carlota Philley, MD, FACS ?[facility]  ?General Surgery ? ? ?08/27/2021 ?9:16 AM ? ? ?

## 2021-08-27 NOTE — H&P (Signed)
?Subjective  ?  ?Chief Complaint: Abscess (Perineum/) ?  ?  ?History of Present Illness: ?Connor Martinez is a 72 y.o. male who was seen by Dr. Bobbye Morton in Dec 2021 for perineal pain. He was taking Brilinta and ASA81 for cardiac stents placed 02/2020 at [facility].  CT scan was obtained which showed a 5.2 x 2.2 cm rim enhancing fluid collection in the right perineum.  He was advised to be direct admitted for I&D but refused and never followed-up. ?  ?He contacted his PCP in Dec 2022, one year later, c/o an area of persistent swelling, pain, and drainage in the perineum, at the same location as last year.  He states that it occasionally drains bloody/white drainage when he is in the shower, but has not drained today.  He states there is no major pain unless he is sitting on it.  He denies fever.  He also complains about an anal wart that has been treated with cryotherapy but is still present.  He is still taking aspirin and Brilinta but did not take Brilinta today because he ran out. ?  ?  ?Review of Systems: ?A complete review of systems was obtained from the patient.  I have reviewed this information and discussed as appropriate with the patient.  See HPI as well for other ROS. ?  ?ROS  ?All other review of systems negative. ?  ?  ?Medical History: ?Past Medical History  ?History reviewed. No pertinent past medical history.  ?  ?  ?There is no problem list on file for this patient. ?  ?  ?Past Surgical History  ?History reviewed. No pertinent surgical history.  ?  ?  ?Allergies  ?         ?Allergies  ?Allergen Reactions  ? Ace Inhibitors Swelling  ?    angioedema  ? Penicillins Rash  ?  ?  ?  ?           ?Current Outpatient Medications on File Prior to Visit  ?Medication Sig Dispense Refill  ? amLODIPine (NORVASC) 10 MG tablet TAKE ONE TABLET BY MOUTH ONCE DAILY please call to schedule appt with office for further refills      ? atorvastatin (LIPITOR) 40 MG tablet Take 80 mg by mouth once daily      ? BRILINTA 90 mg tablet TAKE  ONE TABLET BY MOUTH TWICE DAILY Needs appointment for further refills      ? carvediloL (COREG) 6.25 MG tablet Take 6.25 mg by mouth 2 (two) times daily with meals      ? FUROsemide (LASIX) 20 MG tablet Take 20 mg by mouth once daily      ? potassium chloride (KLOR-CON) 20 MEQ ER tablet TAKE ONE TABLET BY MOUTH ONCE DAILY please schedule an appt for further refills      ? traMADoL (ULTRAM) 50 mg tablet TAKE ONE TABLET BY MOUTH EVERY 6 HOURS AS NEEDED FOR UP TO THREE DAYS      ?  ?No current facility-administered medications on file prior to visit.  ?  ?  ?Family History  ?         ?Family History  ?Problem Relation Age of Onset  ? Coronary Artery Disease (Blocked arteries around heart) Mother    ? Hyperlipidemia (Elevated cholesterol) Mother    ? High blood pressure (Hypertension) Mother    ? Obesity Mother    ? Coronary Artery Disease (Blocked arteries around heart) Brother    ?  ?  ?  ?  Social History  ?  ?     ?Tobacco Use  ?Smoking Status Never  ?Smokeless Tobacco Never  ?  ?  ?Social History  ?Social History  ?  ?       ?Socioeconomic History  ? Marital status: Divorced  ?Tobacco Use  ? Smoking status: Never  ? Smokeless tobacco: Never  ?Substance and Sexual Activity  ? Alcohol use: Never  ? Drug use: Never  ?  ?  ?  ?Objective:  ?  ?BP 118/80   Pulse 73   Temp 37.3 ?C (99.1 ?F)   Ht 167.6 cm ('5\' 6"'$ )   Wt (!) 101.8 kg (224 lb 6.4 oz)   SpO2 98%   BMI 36.22 kg/m?  ?Body mass index is 36.22 kg/m?. ?  ?General: Well-developed, well-nourished, in no acute distress.  Wearing mask ?Eyes: No scleral icterus.  Pupils equal, lids normal ?Respiratory: Normal effort, no use of accessory muscles ?Musculoskeletal: Normal gait. Grossly normal ROM upper and lower extremities  ?Psychiatric: Normal judgement and insight.  Normal mood and affect.  Alert, oriented x 3 ?  ?Right perineum: A 6 cm by 4 cm mobile, soft tissue mass without overlying erythema or induration.  There is one punctate opening on the lateral aspect of  the mass without active drainage. ?  ?Assessment and Plan:  ?  ?Diagnoses and all orders for this visit: ?  ?Perineal mass in male ?  ?Patient is presenting to the office with a chronic right perineal abscess cavity that would benefit from excision in the OR.  There are no active signs of infection warranting incision and drainage today.  Dr. Georgette Dover also evaluated the patient and explained the procedure, risks, and benefits for excision.  The patient agrees to proceed and will call with any questions or concerns before the surgery. ?  ?  ?Carlena Hurl, PA-C ?  ?I examined the patient and recommended incision and drainage of the chronic perineal abscess under general anesthesia.  He also has a tiny skin tag/ wart in the perianal region that may be excised at the time of surgery.  The surgical procedure has been discussed with the patient.  Potential risks, benefits, alternative treatments, and expected outcomes have been explained.  All of the patient's questions at this time have been answered.  The likelihood of reaching the patient's treatment goal is good.  The patient understand the proposed surgical procedure and wishes to proceed. ? ?Imogene Burn. Jaquanna Ballentine, MD, FACS ?[facility]  ?General Surgery ? ? ?08/27/2021 ?8:14 AM ? ?

## 2021-08-27 NOTE — Transfer of Care (Signed)
Immediate Anesthesia Transfer of Care Note ? ?Patient: Connor Martinez ? ?Procedure(s) Performed: INCISION AND DRAINAGE OF PERIANAL ABSCESS (Perineum) ? ?Patient Location: PACU ? ?Anesthesia Type:General ? ?Level of Consciousness: drowsy ? ?Airway & Oxygen Therapy: Patient Spontanous Breathing ? ?Post-op Assessment: Report given to RN and Post -op Vital signs reviewed and stable ? ?Post vital signs: Reviewed and stable ? ?Last Vitals:  ?Vitals Value Taken Time  ?BP 131/87 08/27/21 0921  ?Temp    ?Pulse 58 08/27/21 0921  ?Resp 15 08/27/21 0921  ?SpO2 96 % 08/27/21 0921  ?Vitals shown include unvalidated device data. ? ?Last Pain:  ?Vitals:  ? 08/27/21 0730  ?TempSrc:   ?PainSc: 0-No pain  ?   ? ?  ? ?Complications: No notable events documented. ?

## 2021-08-27 NOTE — Discharge Instructions (Signed)
Expect some drainage every day for the next week or so.  Wear a dry gauze pad in your underwear to absorb the drainage.  There is a soft rubber drain coming out of the abscess cavity which will be draining.  Do not remove this drain yourself.  There is another small suture line next to the anus that may drain a little bit.   ? ?You may shower daily.  After bowel movements, use wet wipes or baby wipes.  You may want to shower after bowel movements to keep that area clean. ? ?After your shower or bowel movements, replace the dry gauze over the two incisions to absorb the drainage. ? ?CCS _______[facility] ? ?RECTAL SURGERY POST OP INSTRUCTIONS: POST OP INSTRUCTIONS ? ?Always review your discharge instruction sheet given to you by the facility where your surgery was performed. ?IF YOU HAVE DISABILITY OR FAMILY LEAVE FORMS, YOU MUST BRING THEM TO THE OFFICE FOR PROCESSING.   ?DO NOT GIVE THEM TO YOUR DOCTOR. ? ?A  prescription for pain medication may be given to you upon discharge.  Take your pain medication as prescribed, if needed.  If narcotic pain medicine is not needed, then you may take acetaminophen (Tylenol) or ibuprofen (Advil) as needed. ?Take your usually prescribed medications unless otherwise directed. ?If you need a refill on your pain medication, please contact your pharmacy.  They will contact our office to request authorization. Prescriptions will not be filled after 5 pm or on week-ends. ?You should follow a light diet the first 48 hours after arrival home, such as soup and crackers, etc.  Be sure to include lots of fluids daily.  Resume your normal diet 2-3 days after surgery.Marland Kitchen ?Most patients will experience some swelling and discomfort in the rectal and perineal area. Ice packs, reclining and warm tub soaks will help.  Swelling and discomfort can take several days to resolve.  ?It is common to experience some constipation if taking pain medication after surgery.  Increasing fluid  intake and taking a stool softener (such as Colace) will usually help or prevent this problem from occurring.  A mild laxative (Milk of Magnesia or Miralax) should be taken according to package directions if there are no bowel movements after 48 hours. ?Unless discharge instructions indicate otherwise, leave your bandage dry and in place for 24 hours, or remove the bandage if you have a bowel movement. You may notice a small amount of bleeding with bowel movements for the first few days. You will need to wear an absorbent pad or soft cotton gauze in your underwear until the drainage stops ?ACTIVITIES:  You may resume regular (light) daily activities beginning the next day--such as daily self-care, walking, climbing stairs--gradually increasing activities as tolerated.  You may have sexual intercourse when it is comfortable.  Refrain from any heavy lifting or straining until approved by your doctor. ?You may drive when you are no longer taking prescription pain medication, you can comfortably wear a seatbelt, and you can safely maneuver your car and apply brakes. ?RETURN TO WORK: : ____________________ ? ?You should see your doctor in the office for a follow-up appointment approximately 2 weeks after your surgery.  Make sure that you call for this appointment within a day or two after you arrive home to insure a convenient appointment time. ?OTHER INSTRUCTIONS:  __________________________________________________________________________________________________________________________________________________________________________________________  ?WHEN TO CALL YOUR DOCTOR: ?Fever over 101.0 ?Inability to urinate ?Nausea and/or vomiting ?Extreme swelling or bruising ?Continued bleeding from rectum. ?Increased pain, redness,  or drainage from the incision ?Constipation ? ?The clinic staff is available to answer your questions during regular business hours.  Please don?t hesitate to call and ask to speak to one of the  nurses for clinical concerns.  If you have a medical emergency, go to the nearest emergency room or call 911.  A surgeon from [facility] is always on call at the hospital ? ? ?[address], [place], [place], [place]  [address] ? ? [address], [place],    [address] ?[phone] ? [phone] ? FAX [phone] ?Web site: [email or web address] ? ?

## 2021-08-28 ENCOUNTER — Encounter (HOSPITAL_COMMUNITY): Payer: Self-pay | Admitting: Surgery

## 2021-08-28 LAB — SURGICAL PATHOLOGY

## 2021-10-06 ENCOUNTER — Encounter (HOSPITAL_COMMUNITY): Payer: Self-pay | Admitting: Nurse Practitioner

## 2021-10-19 ENCOUNTER — Other Ambulatory Visit: Payer: Self-pay | Admitting: Internal Medicine

## 2021-10-25 IMAGING — DX DG CHEST 2V
2 series · 2 of 2 positions shown · non-contrast
Comparison: 9007

CLINICAL DATA: Chest pain

EXAM:
CHEST - 2 VIEW

[chest pa]
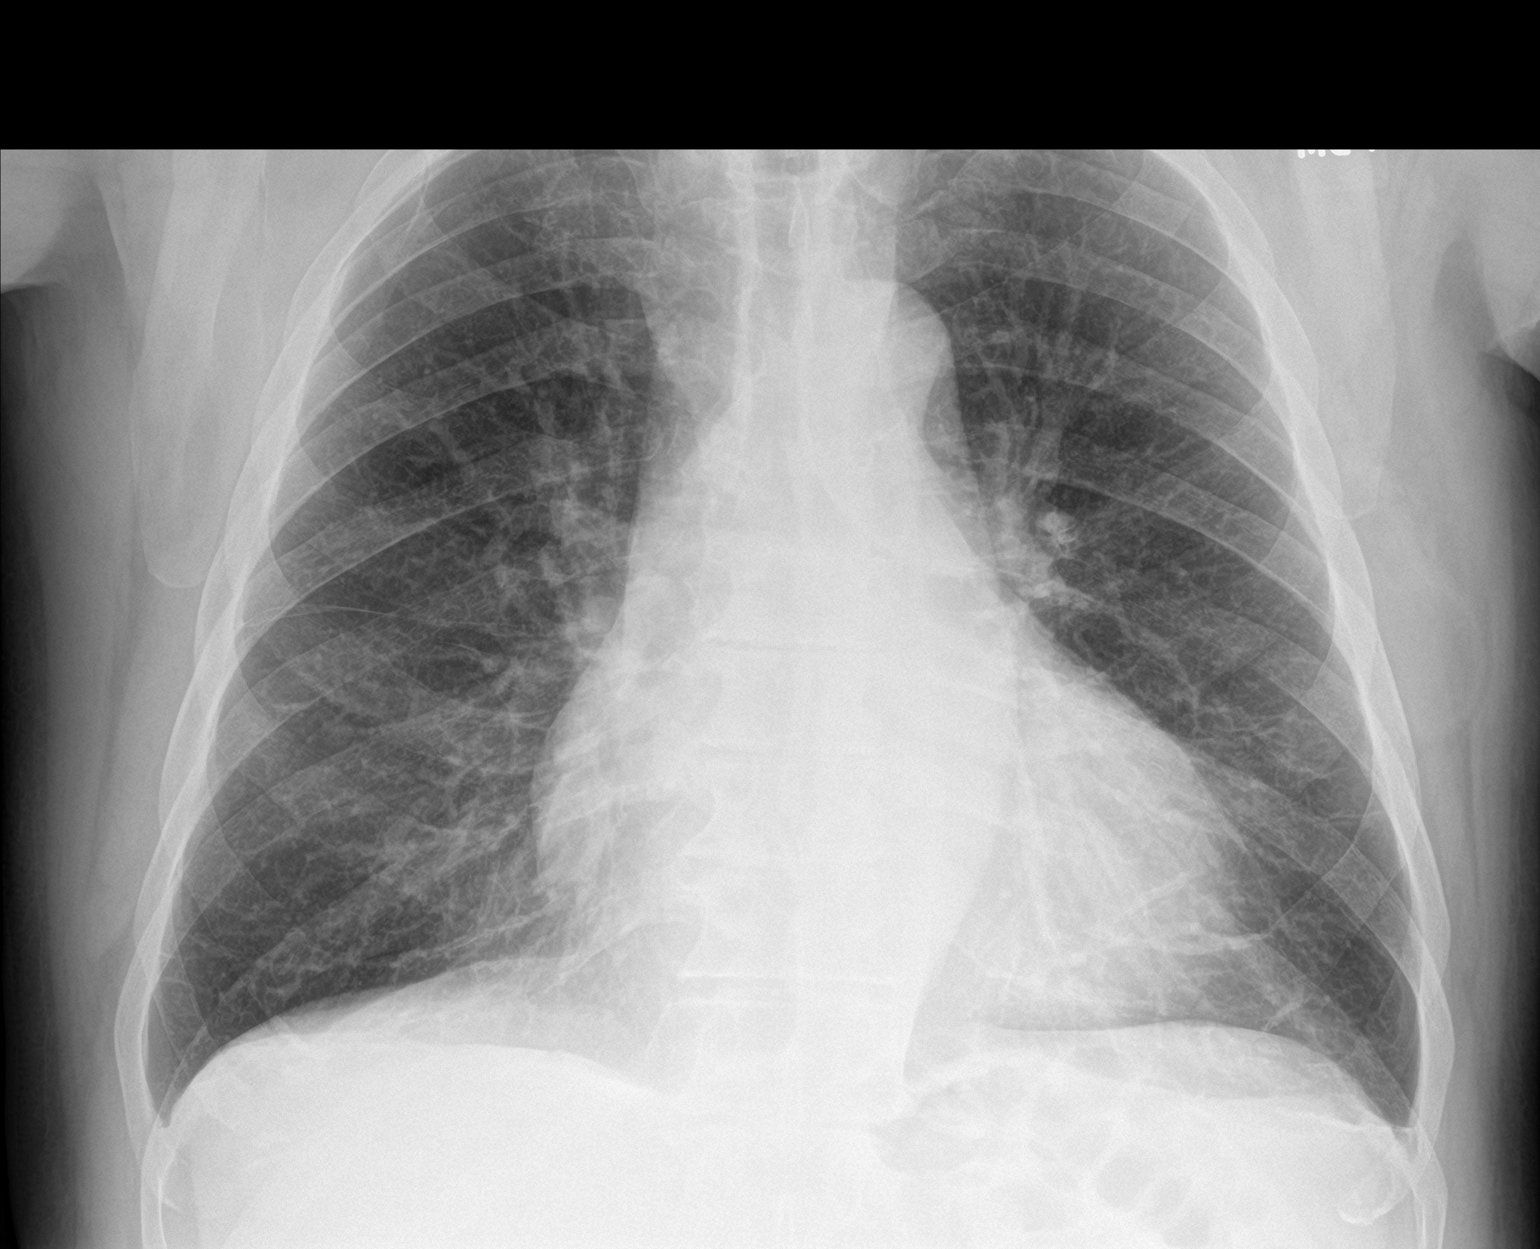

[chest lat]
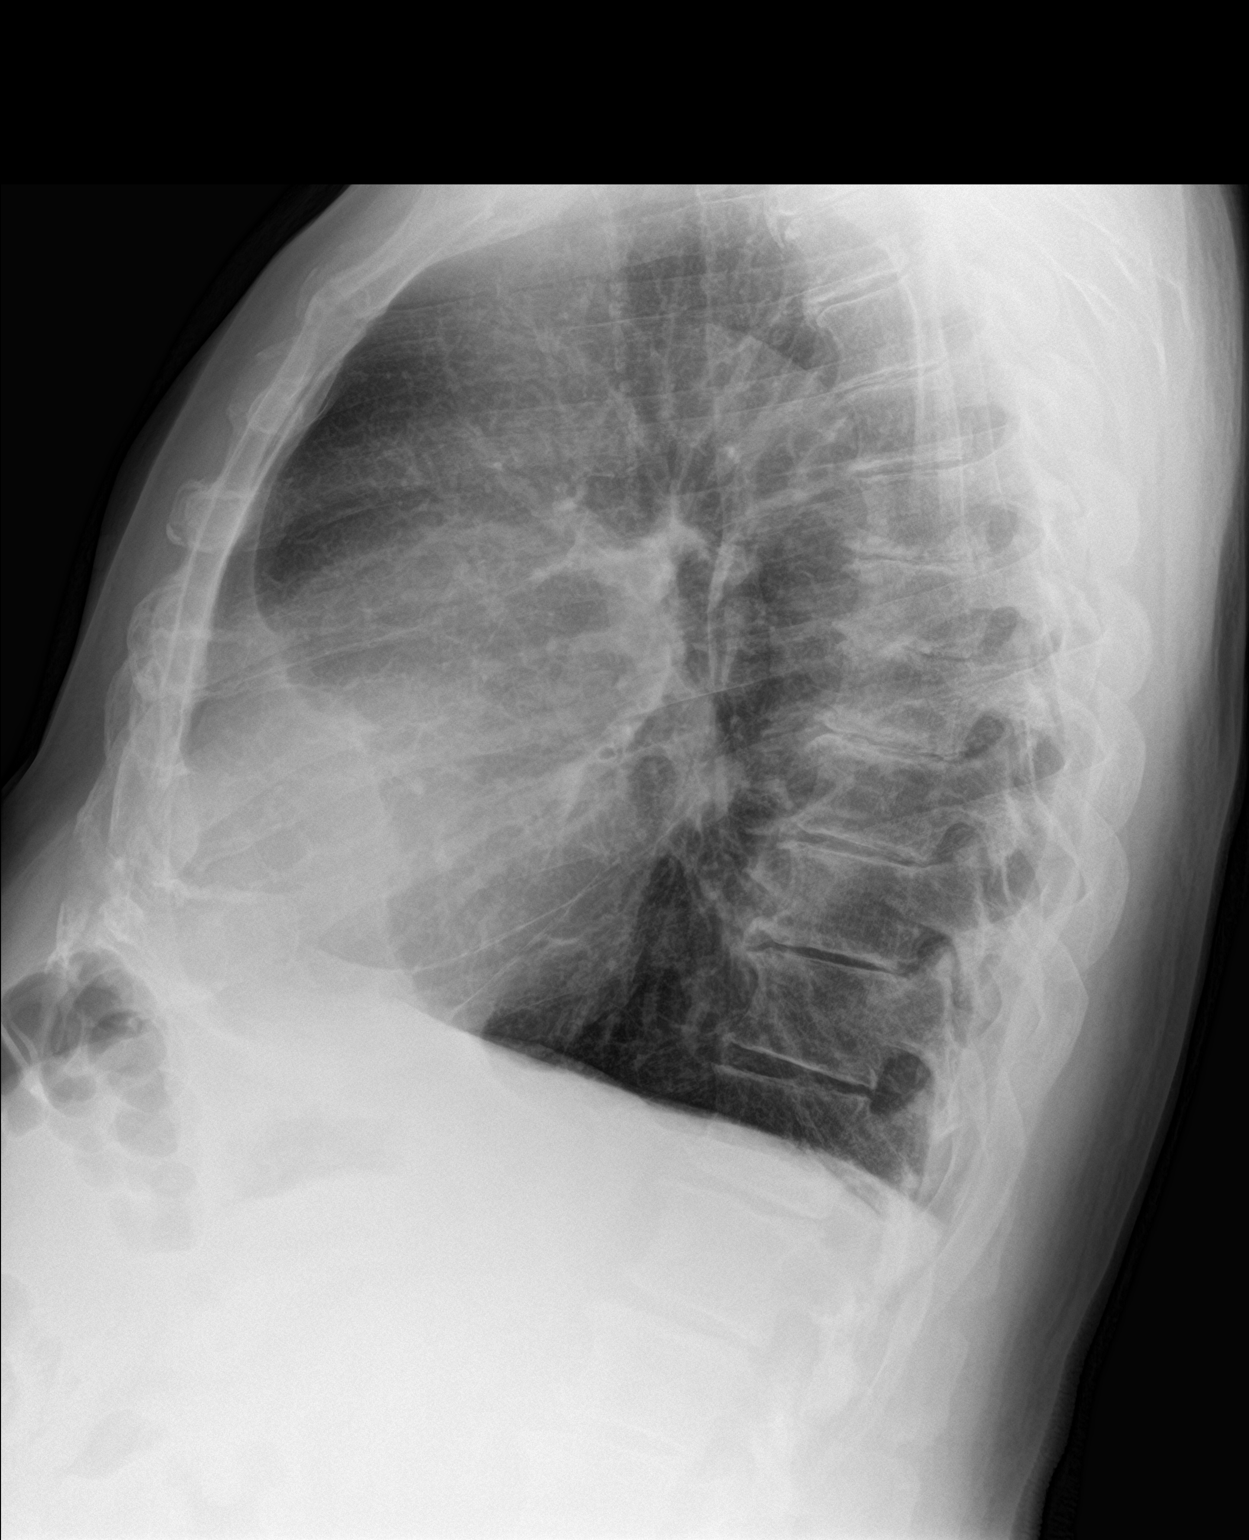

[2 of 2 positions shown; findings below may reference images not displayed]

FINDINGS: No new consolidation or edema. Borderline cardiomegaly. Ascending
thoracic aorta dilatation is not well evaluated. No pleural effusion
or pneumothorax. No acute osseous abnormality.
IMPRESSION: No acute process in the chest.

## 2021-10-26 IMAGING — DX DG CHEST 2V
2 series · 2 of 2 positions shown · non-contrast
Comparison: February 06, 2020

CLINICAL DATA: Chest pain

EXAM:
CHEST - 2 VIEW

[chest pa]
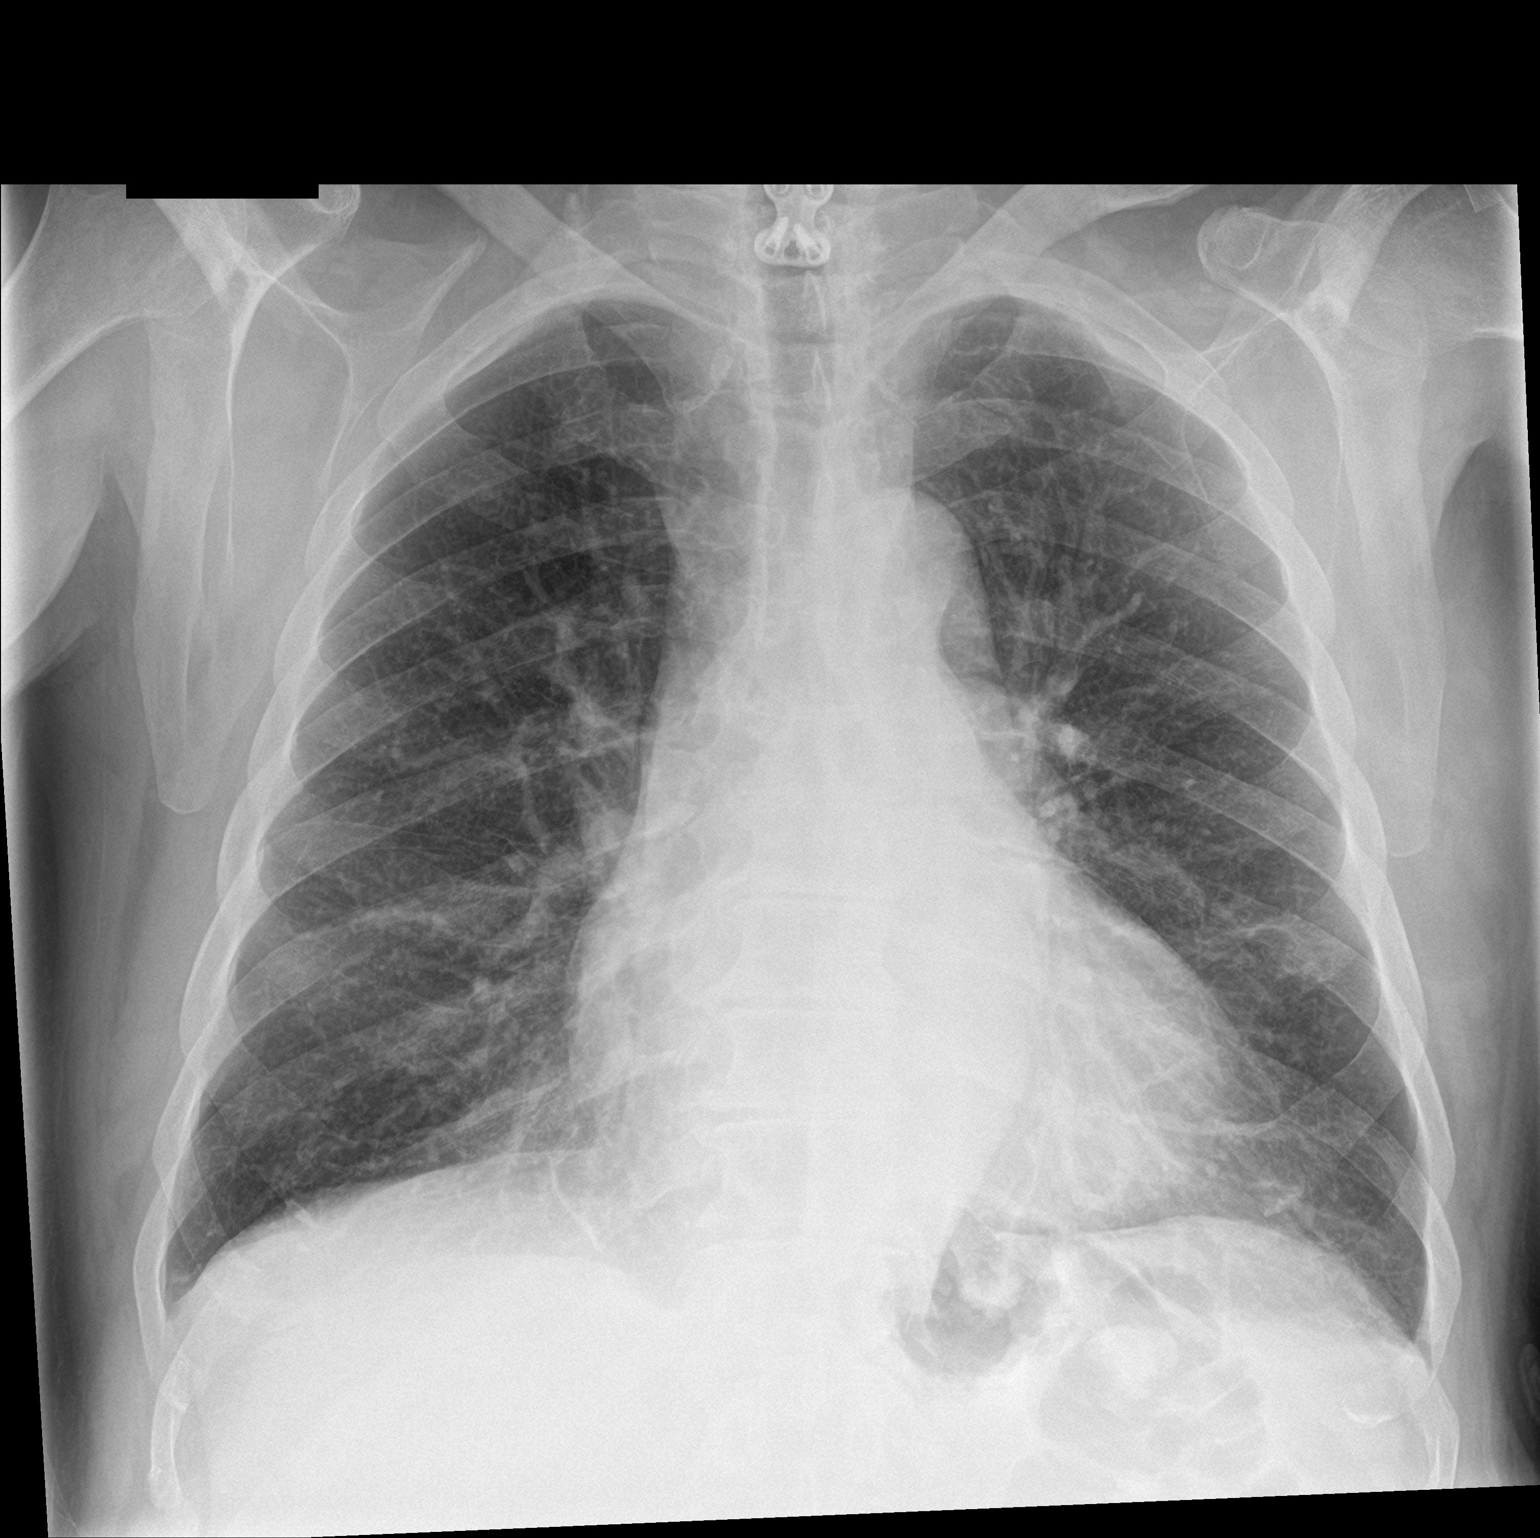

[chest lat]
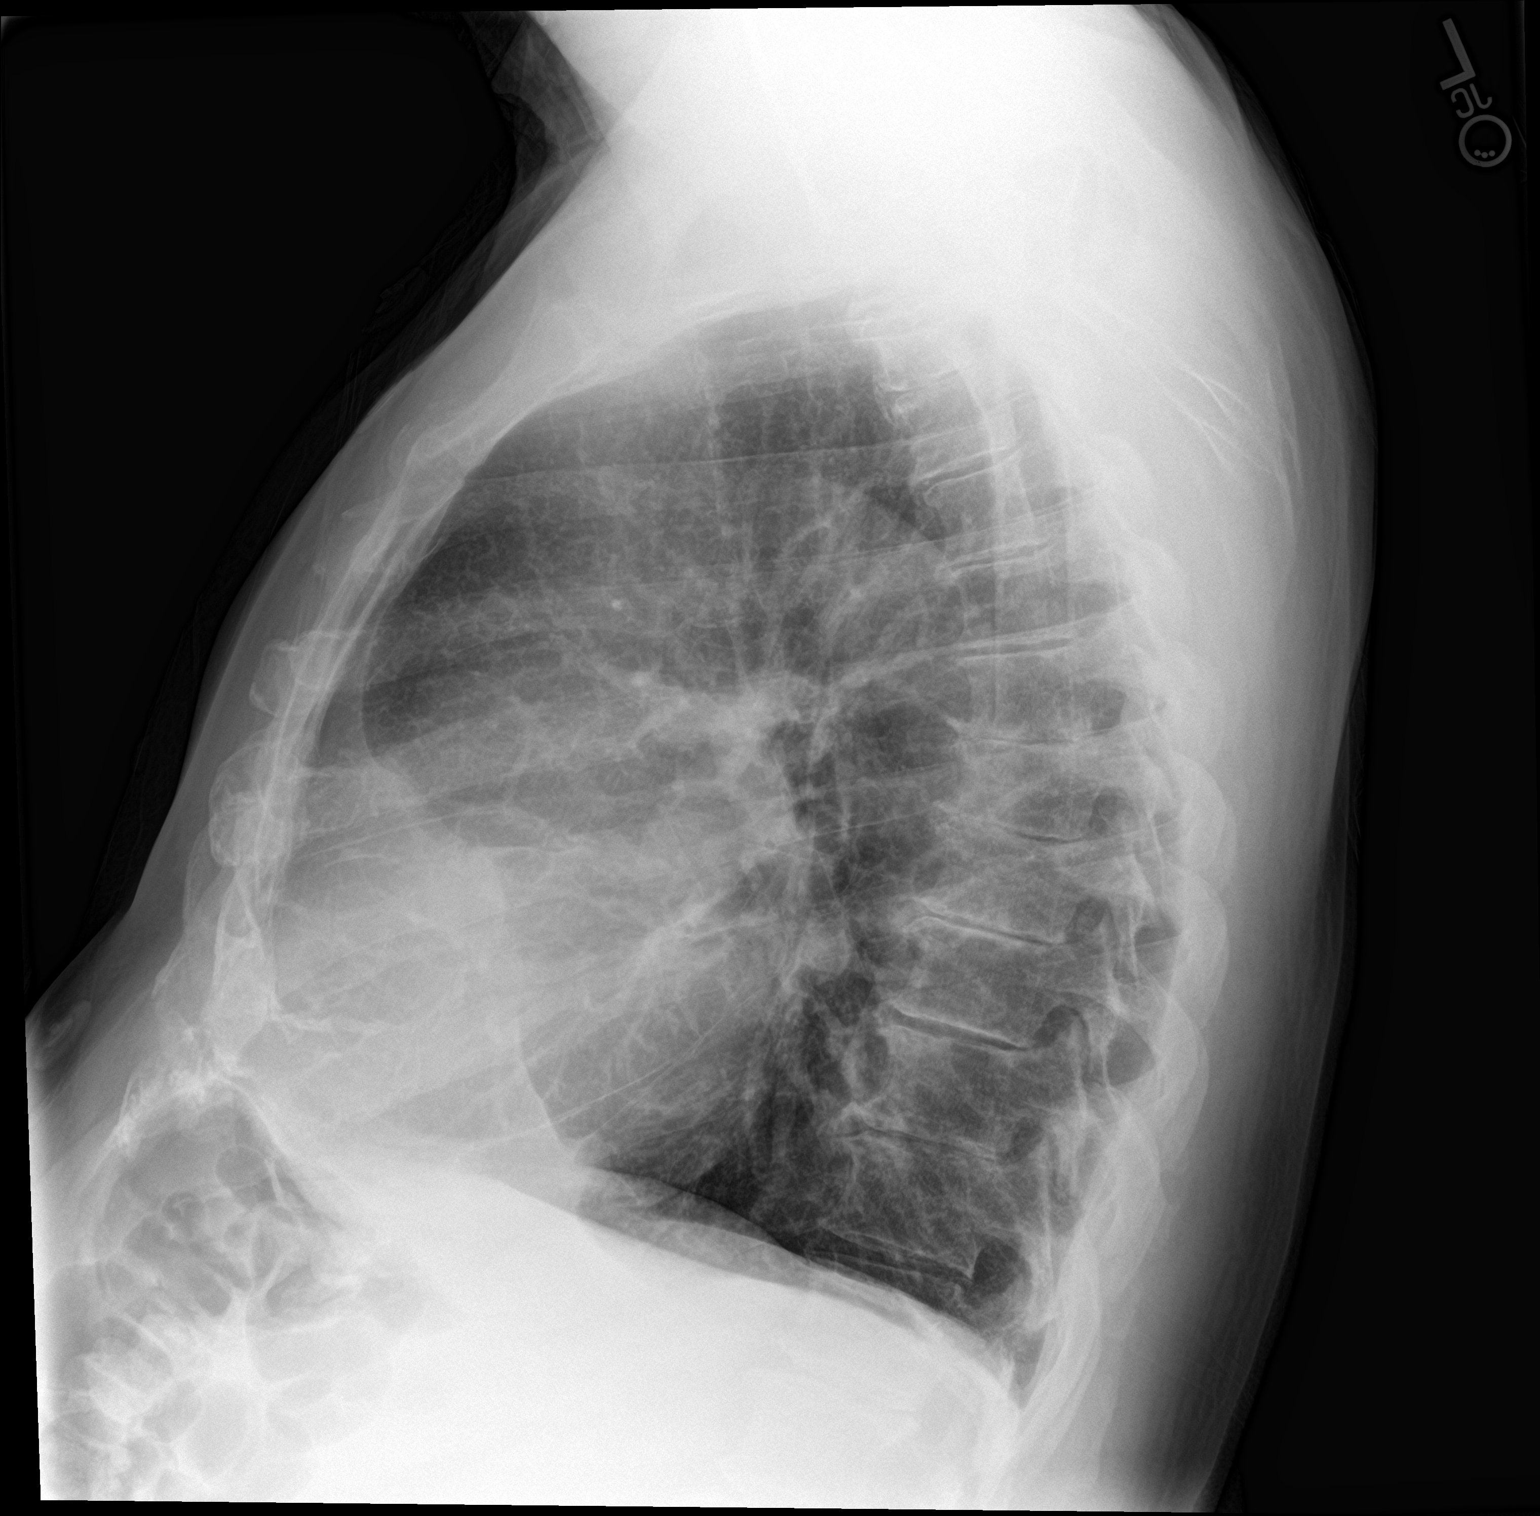

[2 of 2 positions shown; findings below may reference images not displayed]

FINDINGS: There is mild left base atelectasis. The lungs elsewhere are clear.
Heart is borderline enlarged with pulmonary vascularity normal. No
adenopathy. There is postoperative change in the lower cervical
region. There is also postoperative change in the right shoulder.
IMPRESSION: Mild left base atelectasis. Lungs elsewhere clear. Heart borderline
enlarged. No adenopathy.

## 2021-11-19 ENCOUNTER — Telehealth: Payer: Self-pay | Admitting: Internal Medicine

## 2021-11-19 NOTE — Telephone Encounter (Signed)
Patient needs Elequis and Carvedilol - refilled - please send to [facility] -   Patient has an appointment on Monday, 612/2023

## 2021-11-30 ENCOUNTER — Encounter: Payer: Self-pay | Admitting: Internal Medicine

## 2021-11-30 DIAGNOSIS — I4891 Unspecified atrial fibrillation: Secondary | ICD-10-CM | POA: Insufficient documentation

## 2021-11-30 DIAGNOSIS — I4819 Other persistent atrial fibrillation: Secondary | ICD-10-CM | POA: Insufficient documentation

## 2021-11-30 NOTE — Progress Notes (Deleted)
      Subjective:    Patient ID: Connor Martinez, male    DOB: [birth year], 71 y.o.   MRN: [id number]     HPI Connor Martinez is here for follow up of his chronic medical problems, including htn, CAD, afib, hld, hyperglycemia, recurrent angiosarcoma    Medications and allergies reviewed with patient and updated if appropriate.  Current Outpatient Medications on File Prior to Visit  Medication Sig Dispense Refill   apixaban (ELIQUIS) 5 MG TABS tablet Take 1 tablet (5 mg total) by mouth 2 (two) times daily. 60 tablet 6   atorvastatin (LIPITOR) 40 MG tablet TAKE TWO TABLETS BY MOUTH DAILY 180 tablet 2   Camphor-Eucalyptus-Menthol (VICKS VAPORUB EX) Apply 1 application topically daily as needed (congestion).     carvedilol (COREG) 12.5 MG tablet Take 1 tablet (12.5 mg total) by mouth 2 (two) times daily with a meal. 180 tablet 3   furosemide (LASIX) 40 MG tablet Take 1 tablet (40 mg total) by mouth daily. 90 tablet 3   nitroGLYCERIN (NITROSTAT) 0.4 MG SL tablet Place 1 tablet (0.4 mg total) under the tongue every 5 (five) minutes x 3 doses as needed for chest pain. 25 tablet 7   oxyCODONE (OXY IR/ROXICODONE) 5 MG immediate release tablet Take 1 tablet (5 mg total) by mouth every 6 (six) hours as needed for severe pain. 20 tablet 0   potassium chloride SA (KLOR-CON M) 20 MEQ tablet TAKE ONE TABLET BY MOUTH ONCE DAILY please schedule an appt for further refills 30 tablet 0   No current facility-administered medications on file prior to visit.     Review of Systems     Objective:  There were no vitals filed for this visit. BP Readings from Last 3 Encounters:  08/27/21 (!) 153/91  08/05/21 (!) 142/86  07/17/21 128/80   Wt Readings from Last 3 Encounters:  08/27/21 213 lb (96.6 kg)  08/05/21 216 lb 9.6 oz (98.2 kg)  07/17/21 222 lb (100.7 kg)   There is no height or weight on file to calculate BMI.    Physical Exam     Lab Results  Component Value Date   WBC 7.8 08/27/2021   HGB 14.9  08/27/2021   HCT 43.7 08/27/2021   PLT 264 08/27/2021   GLUCOSE 121 (H) 08/27/2021   CHOL 128 04/19/2020   TRIG 83 04/19/2020   HDL 44 04/19/2020   LDLCALC 68 04/19/2020   ALT 18 08/27/2021   AST 16 08/27/2021   NA 139 08/27/2021   K 3.6 08/27/2021   CL 105 08/27/2021   CREATININE 0.99 08/27/2021   BUN 8 08/27/2021   CO2 25 08/27/2021   TSH 1.490 07/17/2021   PSA 3.99 10/25/2019   HGBA1C 5.3 02/07/2020     Assessment & Plan:    See Problem List for Assessment and Plan of chronic medical problems.

## 2021-12-01 ENCOUNTER — Ambulatory Visit: Payer: Medicare HMO | Admitting: Internal Medicine

## 2021-12-01 DIAGNOSIS — I4811 Longstanding persistent atrial fibrillation: Secondary | ICD-10-CM

## 2021-12-01 DIAGNOSIS — I2510 Atherosclerotic heart disease of native coronary artery without angina pectoris: Secondary | ICD-10-CM

## 2021-12-01 DIAGNOSIS — R739 Hyperglycemia, unspecified: Secondary | ICD-10-CM

## 2021-12-01 DIAGNOSIS — I10 Essential (primary) hypertension: Secondary | ICD-10-CM

## 2021-12-01 DIAGNOSIS — E782 Mixed hyperlipidemia: Secondary | ICD-10-CM

## 2021-12-11 ENCOUNTER — Telehealth: Payer: Self-pay

## 2021-12-11 ENCOUNTER — Other Ambulatory Visit: Payer: Self-pay

## 2021-12-11 ENCOUNTER — Other Ambulatory Visit: Payer: Self-pay | Admitting: Internal Medicine

## 2021-12-11 DIAGNOSIS — I10 Essential (primary) hypertension: Secondary | ICD-10-CM

## 2021-12-11 NOTE — Telephone Encounter (Signed)
   Telephone encounter was:  Unsuccessful.  12/11/2021 Name: Connor Martinez MRN: [id number] DOB: 25-Jan-1951  Unsuccessful outbound call made today to assist with:  Home Modifications and nutrition and exercise.  Outreach Attempt:  1st Attempt  A HIPAA compliant voice message was left requesting a return call.  Instructed patient to call back at [phone].  Aliannah Holstrom, AAS Paralegal, [facility]  [address], [place] [address] ??[email or web address]  ?? [phone]   [email or web address]

## 2021-12-11 NOTE — Patient Outreach (Signed)
Received a referral from [facility] for Connor Martinez . The Primary Care Physician has a [facility] Embedded Nurse.   I have sent a SDOH referral to the Embedded Team.   [facility], [place], [place] [facility] [phone]

## 2021-12-15 ENCOUNTER — Telehealth: Payer: Self-pay

## 2021-12-15 ENCOUNTER — Ambulatory Visit (INDEPENDENT_AMBULATORY_CARE_PROVIDER_SITE_OTHER): Payer: Medicare HMO

## 2021-12-15 DIAGNOSIS — Z0000 Encounter for general adult medical examination without abnormal findings: Secondary | ICD-10-CM | POA: Diagnosis not present

## 2021-12-15 NOTE — Telephone Encounter (Signed)
   Telephone encounter was:  Unsuccessful.  12/15/2021 Name: Connor Martinez MRN: [id number] DOB: 13-Feb-1951  Unsuccessful outbound call made today to assist with:  nutrition, exercise and bathroom modifications.  Outreach Attempt:  2nd Attempt  A HIPAA compliant voice message was left requesting a return call.  Instructed patient to call back at [phone]. nutrition, exercise and bathroom modifications.  Donette Mainwaring, AAS Paralegal, [facility] Care Guide  Embedded Care Coordination [place]  [facility]  [address] [place], [place] [address] ??[email or web address]  ?? [phone]   [email or web address]

## 2021-12-17 ENCOUNTER — Other Ambulatory Visit: Payer: Self-pay | Admitting: Internal Medicine

## 2021-12-18 ENCOUNTER — Other Ambulatory Visit: Payer: Self-pay

## 2021-12-18 ENCOUNTER — Telehealth: Payer: Self-pay

## 2021-12-18 MED ORDER — POTASSIUM CHLORIDE CRYS ER 20 MEQ PO TBCR: EXTENDED_RELEASE_TABLET | ORAL | 1 refills | Status: DC

## 2021-12-18 NOTE — Telephone Encounter (Signed)
   Telephone encounter was:  Unsuccessful.  12/18/2021 Name: Connor Martinez MRN: [id number] DOB: 1951/02/08  Unsuccessful outbound call made today to assist with:   nutrition, exercise, bathroom modification.  Outreach Attempt:  3rd Attempt.  Referral closed unable to contact patient.  A HIPAA compliant voice message was left requesting a return call.  Instructed patient to call back at [phone]. Left messages home and cell voicemail for patient to return my call regarding resources for nutrition, exercise and bathroom modifications.   Kinsley Holderman, AAS Paralegal, [facility]  [address], [place] [address] ??[email or web address]  ?? [phone]   [email or web address]

## 2021-12-19 NOTE — Telephone Encounter (Signed)
Spoke with pt and he has stated he will call the clinic when he can make it in to schedule an apptmnt.

## 2021-12-26 ENCOUNTER — Encounter: Payer: Self-pay | Admitting: Internal Medicine

## 2021-12-26 ENCOUNTER — Telehealth: Payer: Self-pay | Admitting: Internal Medicine

## 2021-12-26 NOTE — Telephone Encounter (Signed)
Pt is requesting a call from a nurse. He stated he is experiencing pain in perineum. He would like advice on how to relieve the pain. He stated he does not want to go to the ER because this is a recurring issue.   Please advise  CB: [phone]

## 2021-12-26 NOTE — Telephone Encounter (Signed)
Spoke with patient today.  States he does not want to come in due to pain and him being uncomfortable. He thinks he may have an infection but only wants to have an MRI ordered or CT scan. Explained to patient he would need to be seen and that we could not order test with OV. He states he knows what's wrong with him and he just wants to get to the bottom of things and that he has this issue every so often. He has agreed to send pics to determine what needs to be done. States he can't drive due to him being uncomfortable and being unable to sit. Explained to patient again that he would need to be seen for further evaluation. He states he will send pics and await.

## 2022-01-15 ENCOUNTER — Encounter: Payer: Self-pay | Admitting: Internal Medicine

## 2022-01-15 NOTE — Progress Notes (Signed)
Subjective:    Patient ID: Connor Martinez, male    DOB: September 18, 1950, 71 y.o.   MRN: [id number]      HPI [facility] is here for  Chief Complaint  Patient presents with   Follow-up    H/o perirectal abscess identified 05/2020 by Ct scan.  He had a 5.2 x 2.2 cm abscess in his right perineum - he was advised admission and I&D but refused.  05/2021 he contacted [place] about this infection - pain, swelling and drainage in the perinem.  It would intermittently drain.  He ended up seeing surgery.  08/27/2021-I & D by [facility].  The drain was removed 09/12/21 and was almost healed.    2 weeks the abscess recurred.  At one point it did start to drain and he did get a lot of pus and blood out.  It is much better, but he can still feel a hard firm area there.  He knows is not gone completely and could recur.  He denies any drainage for the past couple of weeks.  He denies fevers.  He wondered if it was related to poor hygiene, sitting around too much, not eating properly or drinking grapefruit juice.  He is trying to change his lifestyle and be more healthy.  He is still smoking, but less than a pack per day.  He still has some discomfort from the abscess.  Since it is better and not actively draining he does not want to see surgery or have any procedure for it.  He is taking his medications as prescribed.   Medications and allergies reviewed with patient and updated if appropriate.  Current Outpatient Medications on File Prior to Visit  Medication Sig Dispense Refill   apixaban (ELIQUIS) 5 MG TABS tablet Take 1 tablet (5 mg total) by mouth 2 (two) times daily. 60 tablet 6   atorvastatin (LIPITOR) 40 MG tablet TAKE TWO TABLETS BY MOUTH DAILY 180 tablet 2   Camphor-Eucalyptus-Menthol (VICKS VAPORUB EX) Apply 1 application topically daily as needed (congestion).     carvedilol (COREG) 12.5 MG tablet Take 1 tablet (12.5 mg total) by mouth 2 (two) times daily with a meal. 180 tablet 3   furosemide  (LASIX) 40 MG tablet Take 1 tablet (40 mg total) by mouth daily. 90 tablet 3   nitroGLYCERIN (NITROSTAT) 0.4 MG SL tablet Place 1 tablet (0.4 mg total) under the tongue every 5 (five) minutes x 3 doses as needed for chest pain. 25 tablet 7   potassium chloride SA (KLOR-CON M) 20 MEQ tablet TAKE ONE TABLET BY MOUTH ONCE DAILY 30 tablet 1   No current facility-administered medications on file prior to visit.    Review of Systems  Constitutional:  Negative for chills and fever.  Respiratory:  Positive for cough (occ), shortness of breath and wheezing.   Cardiovascular:  Positive for leg swelling (mild). Negative for chest pain and palpitations.  Neurological:  Negative for light-headedness and headaches.       Objective:   Vitals:   01/16/22 1034  BP: 140/80  Pulse: 94  Temp: 98.7 F (37.1 C)  SpO2: 97%   BP Readings from Last 3 Encounters:  01/16/22 140/80  08/27/21 (!) 153/91  08/05/21 (!) 142/86   Wt Readings from Last 3 Encounters:  01/16/22 209 lb (94.8 kg)  08/27/21 213 lb (96.6 kg)  08/05/21 216 lb 9.6 oz (98.2 kg)   Body mass index is 33.73 kg/m.    Physical Exam Constitutional:  General: He is not in acute distress.    Appearance: Normal appearance. He is not ill-appearing.  HENT:     Head: Normocephalic and atraumatic.  Eyes:     Conjunctiva/sclera: Conjunctivae normal.  Cardiovascular:     Rate and Rhythm: Normal rate. Rhythm irregular.     Heart sounds: Normal heart sounds. No murmur heard. Pulmonary:     Effort: Pulmonary effort is normal. No respiratory distress.     Breath sounds: Normal breath sounds. No wheezing or rales.  Genitourinary:    Comments: Patient deferred me examining the area of his abscess Musculoskeletal:     Right lower leg: Edema (mild) present.     Left lower leg: Edema (mild) present.  Skin:    General: Skin is warm and dry.     Findings: No rash.  Neurological:     Mental Status: He is alert. Mental status is at  baseline.  Psychiatric:        Mood and Affect: Mood normal.            Assessment & Plan:    See Problem List for Assessment and Plan of chronic medical problems.   I spent 20 minutes dedicated to the care of this patient on the date of this encounter including review of recent labs, imaging and procedures, speciality notes, obtaining history, communicating with the patient, ordering medications, tests, and documenting clinical information in the EHR

## 2022-01-16 ENCOUNTER — Ambulatory Visit (INDEPENDENT_AMBULATORY_CARE_PROVIDER_SITE_OTHER): Payer: Medicare HMO | Admitting: Internal Medicine

## 2022-01-16 VITALS — BP 140/80 | HR 94 | Temp 98.7°F | Ht 66.0 in | Wt 209.0 lb

## 2022-01-16 DIAGNOSIS — T783XXD Angioneurotic edema, subsequent encounter: Secondary | ICD-10-CM

## 2022-01-16 DIAGNOSIS — I2510 Atherosclerotic heart disease of native coronary artery without angina pectoris: Secondary | ICD-10-CM | POA: Diagnosis not present

## 2022-01-16 DIAGNOSIS — I10 Essential (primary) hypertension: Secondary | ICD-10-CM

## 2022-01-16 DIAGNOSIS — R739 Hyperglycemia, unspecified: Secondary | ICD-10-CM | POA: Diagnosis not present

## 2022-01-16 DIAGNOSIS — E782 Mixed hyperlipidemia: Secondary | ICD-10-CM | POA: Diagnosis not present

## 2022-01-16 DIAGNOSIS — R600 Localized edema: Secondary | ICD-10-CM

## 2022-01-16 DIAGNOSIS — L02215 Cutaneous abscess of perineum: Secondary | ICD-10-CM | POA: Diagnosis not present

## 2022-01-16 DIAGNOSIS — I4811 Longstanding persistent atrial fibrillation: Secondary | ICD-10-CM | POA: Diagnosis not present

## 2022-01-16 LAB — CBC WITH DIFFERENTIAL/PLATELET
Basophils Absolute: 0 10*3/uL (ref 0.0–0.1)
Basophils Relative: 0.5 % (ref 0.0–3.0)
Eosinophils Absolute: 0.2 10*3/uL (ref 0.0–0.7)
Eosinophils Relative: 2.8 % (ref 0.0–5.0)
HCT: 43.5 % (ref 39.0–52.0)
Hemoglobin: 14.3 g/dL (ref 13.0–17.0)
Lymphocytes Relative: 18.8 % (ref 12.0–46.0)
Lymphs Abs: 1.4 10*3/uL (ref 0.7–4.0)
MCHC: 32.9 g/dL (ref 30.0–36.0)
MCV: 84.8 fl (ref 78.0–100.0)
Monocytes Absolute: 0.6 10*3/uL (ref 0.1–1.0)
Monocytes Relative: 7.5 % (ref 3.0–12.0)
Neutro Abs: 5.2 10*3/uL (ref 1.4–7.7)
Neutrophils Relative %: 70.4 % (ref 43.0–77.0)
Platelets: 244 10*3/uL (ref 150.0–400.0)
RBC: 5.12 Mil/uL (ref 4.22–5.81)
RDW: 15.4 % (ref 11.5–15.5)
WBC: 7.3 10*3/uL (ref 4.0–10.5)

## 2022-01-16 LAB — COMPREHENSIVE METABOLIC PANEL
ALT: 15 U/L (ref 0–53)
AST: 14 U/L (ref 0–37)
Albumin: 4 g/dL (ref 3.5–5.2)
Alkaline Phosphatase: 96 U/L (ref 39–117)
BUN: 10 mg/dL (ref 6–23)
CO2: 29 mEq/L (ref 19–32)
Calcium: 9.3 mg/dL (ref 8.4–10.5)
Chloride: 103 mEq/L (ref 96–112)
Creatinine, Ser: 0.81 mg/dL (ref 0.40–1.50)
GFR: 89.75 mL/min (ref >60.00)
Glucose, Bld: 124 mg/dL — ABNORMAL HIGH (ref 70–99)
Potassium: 3.5 mEq/L (ref 3.5–5.1)
Sodium: 141 mEq/L (ref 135–145)
Total Bilirubin: 0.9 mg/dL (ref 0.2–1.2)
Total Protein: 7.5 g/dL (ref 6.0–8.3)

## 2022-01-16 LAB — LIPID PANEL
Cholesterol: 140 mg/dL (ref 0–200)
HDL: 35.9 mg/dL — ABNORMAL LOW (ref >39.00)
LDL Cholesterol: 85 mg/dL (ref 0–99)
NonHDL: 103.81
Total CHOL/HDL Ratio: 4
Triglycerides: 92 mg/dL (ref 0.0–149.0)
VLDL: 18.4 mg/dL (ref 0.0–40.0)

## 2022-01-16 LAB — HEMOGLOBIN A1C: Hgb A1c MFr Bld: 6.3 % (ref 4.6–6.5)

## 2022-01-16 NOTE — Assessment & Plan Note (Signed)
Chronic Controlled, Stable Continue Lasix 40 mg daily

## 2022-01-16 NOTE — Assessment & Plan Note (Signed)
Chronic Check a1c Low sugar / carb diet Stressed regular exercise  

## 2022-01-16 NOTE — Assessment & Plan Note (Signed)
Chronic Regular exercise and healthy diet encouraged Check lipid panel, CMP Continue atorvastatin 40 mg daily 

## 2022-01-16 NOTE — Assessment & Plan Note (Signed)
Chronic History of non-ST elevation MI with PCI x3 Following with cardiology Continue current medications

## 2022-01-16 NOTE — Patient Instructions (Addendum)
     Blood work was ordered.      Medications changes include :  none      Return in about 1 year (around 01/17/2023) for Physical Exam.

## 2022-01-16 NOTE — Assessment & Plan Note (Signed)
Chronic Blood pressure borderline controlled CMP Continue carvedilol 12.5 mg twice daily

## 2022-01-16 NOTE — Assessment & Plan Note (Signed)
Chronic Continues to have intermittent angioedema Takes Benadryl as needed and this is effective

## 2022-01-16 NOTE — Assessment & Plan Note (Signed)
Abscess first identified 2021 I&D on 08/27/2021 by [facility] surgery 2 weeks ago abscess recurred.  It did open up and drain a lot of blood and pus.  No drainage at this time, but still feels a small hard bump in the area Deferred referral to surgery At this point there is no evidence of infection so we will hold off on antibiotic

## 2022-01-16 NOTE — Assessment & Plan Note (Signed)
Chronic Following with cardiology On carvedilol 12.5 mg twice daily and Eliquis 5 mg twice daily CBC, CMP

## 2022-01-21 ENCOUNTER — Ambulatory Visit: Payer: Medicare HMO | Admitting: Nurse Practitioner

## 2022-01-23 ENCOUNTER — Other Ambulatory Visit (HOSPITAL_BASED_OUTPATIENT_CLINIC_OR_DEPARTMENT_OTHER): Payer: Self-pay | Admitting: Family

## 2022-01-23 DIAGNOSIS — I4819 Other persistent atrial fibrillation: Secondary | ICD-10-CM

## 2022-01-23 DIAGNOSIS — Z7901 Long term (current) use of anticoagulants: Secondary | ICD-10-CM

## 2022-01-23 NOTE — Telephone Encounter (Signed)
Eliquis '5mg'$  refill request received. Patient is 71 years old, weight-94.8kg, Crea-0.81 on 01/16/2022, Diagnosis-Afib, and last seen by Diona Browner, NP on 07/17/2021. Dose is appropriate based on dosing criteria. Will send in refill to requested pharmacy.

## 2022-02-12 ENCOUNTER — Other Ambulatory Visit: Payer: Self-pay

## 2022-02-12 MED ORDER — POTASSIUM CHLORIDE CRYS ER 20 MEQ PO TBCR: EXTENDED_RELEASE_TABLET | ORAL | 1 refills | Status: DC

## 2022-02-12 NOTE — Telephone Encounter (Signed)
Pt's medication was sent to pt's pharmacy as requested. Confirmation received.  °

## 2022-04-19 ENCOUNTER — Other Ambulatory Visit (HOSPITAL_BASED_OUTPATIENT_CLINIC_OR_DEPARTMENT_OTHER): Payer: Self-pay | Admitting: Family

## 2022-04-19 ENCOUNTER — Other Ambulatory Visit: Payer: Self-pay | Admitting: Cardiovascular Disease

## 2022-04-20 NOTE — Telephone Encounter (Signed)
Rx(s) sent to pharmacy electronically.  

## 2022-04-28 ENCOUNTER — Other Ambulatory Visit: Payer: Self-pay

## 2022-04-28 DIAGNOSIS — Z7901 Long term (current) use of anticoagulants: Secondary | ICD-10-CM

## 2022-04-28 DIAGNOSIS — I4819 Other persistent atrial fibrillation: Secondary | ICD-10-CM

## 2022-04-28 MED ORDER — APIXABAN 5 MG PO TABS: 5.0000 mg | ORAL_TABLET | Freq: Two times a day (BID) | ORAL | 6 refills | Status: DC

## 2022-04-28 MED ORDER — CARVEDILOL 12.5 MG PO TABS: 12.5000 mg | ORAL_TABLET | Freq: Two times a day (BID) | ORAL | 0 refills | Status: DC

## 2022-04-28 MED ORDER — ATORVASTATIN CALCIUM 40 MG PO TABS: 80.0000 mg | ORAL_TABLET | Freq: Every day | ORAL | 0 refills | Status: DC

## 2022-04-28 MED ORDER — FUROSEMIDE 40 MG PO TABS: 40.0000 mg | ORAL_TABLET | Freq: Every day | ORAL | 0 refills | Status: DC

## 2022-04-28 NOTE — Telephone Encounter (Signed)
Prescription refill request for Eliquis received. Indication:afib Last office visit:2/23 Scr:0.8 Age: 71 Weight:94.8 kg  Prescription refilled

## 2022-05-07 ENCOUNTER — Telehealth: Payer: Self-pay | Admitting: Cardiovascular Disease

## 2022-05-07 NOTE — Telephone Encounter (Signed)
  Patient calling the office for samples of medication:   1.  What medication and dosage are you requesting samples for? apixaban (ELIQUIS) 5 MG TABS tablet   2.  Are you currently out of this medication? Yes    Pt said he need 2 weeks supply samples to help him have enough medication through the year, he his waiting his 30 days supply from [place]

## 2022-05-07 NOTE — Telephone Encounter (Signed)
Hey Lynn, LPN, can you please advise on this matter? Thanks  ?

## 2022-05-08 NOTE — Telephone Encounter (Signed)
**Note De-Identified Clairessa Boulet Obfuscation** Per the pts chart, we e-scribed his Eliquis to be filled on 11/7 so I called [employer], [place] (Ph: [phone]) and s/w [place].  Per Isaias Sakai, the pt is in his donut hole and that his co-pay for Eliquis is $150/30 day supply.  I called the pt to discuss and he states that he thought he had already paid his co-pay and checked his account while on this call and found that it is still pending.  He asked me what phone number I called [facility] at and I advised him [phone]. He states that maybe the problem as he has been calling a different number to s/w them. He states that he is calling them at the number I provided him with to try to pay his co-pay and to receive his Eliquis.  He is aware that we are leaving him 2 weeks of Eliquis 5 mg samples in the front office at Dr [employer] office at [facility] for him to pick up and  that we may not be able to provide any more going forward as we are trying to assist all our pts and that we do not have a learge supply of them.  He verbalized understanding and stated that he hopes to get this taken care of today and states that he will call us if he he needs assistance for his Eliquis. He thanked me for our assistance.

## 2022-07-05 ENCOUNTER — Other Ambulatory Visit: Payer: Self-pay | Admitting: Cardiovascular Disease

## 2022-07-14 ENCOUNTER — Encounter: Payer: Self-pay | Admitting: Cardiovascular Disease

## 2022-07-14 ENCOUNTER — Ambulatory Visit: Payer: Medicare HMO | Attending: Cardiovascular Disease | Admitting: Cardiovascular Disease

## 2022-07-14 VITALS — BP 120/80 | HR 75 | Ht 67.0 in | Wt 228.0 lb

## 2022-07-14 DIAGNOSIS — E785 Hyperlipidemia, unspecified: Secondary | ICD-10-CM | POA: Diagnosis not present

## 2022-07-14 DIAGNOSIS — I5022 Chronic systolic (congestive) heart failure: Secondary | ICD-10-CM | POA: Diagnosis not present

## 2022-07-14 DIAGNOSIS — I4819 Other persistent atrial fibrillation: Secondary | ICD-10-CM | POA: Diagnosis not present

## 2022-07-14 MED ORDER — NITROGLYCERIN 0.4 MG SL SUBL: SUBLINGUAL_TABLET | SUBLINGUAL | 6 refills | Status: AC

## 2022-07-14 NOTE — Patient Instructions (Signed)
Medication Instructions:  Your physician recommends that you continue on your current medications as directed. Please refer to the Current Medication list given to you today.  *If you need a refill on your cardiac medications before your next appointment, please call your pharmacy*  Lab Work: NONE If you have labs (blood work) drawn today and your tests are completely normal, you will receive your results only by: [place] (if you have MyChart) OR A paper copy in the mail If you have any lab test that is abnormal or we need to change your treatment, we will call you to review the results.  Testing/Procedures: ECHO (prior to next appt) Your physician has requested that you have an echocardiogram. Echocardiography is a painless test that uses sound waves to create images of your heart. It provides your doctor with information about the size and shape of your heart and how well your heart's chambers and valves are working. This procedure takes approximately one hour. There are no restrictions for this procedure. Please do NOT wear cologne, perfume, aftershave, or lotions (deodorant is allowed). Please arrive 15 minutes prior to your appointment time.  Follow-Up: At [facility], you and your health needs are our priority.  As part of our continuing mission to provide you with exceptional heart care, we have created designated Provider Care Teams.  These Care Teams include your primary Cardiologist (physician) and Advanced Practice Providers (APPs -  Physician Assistants and Nurse Practitioners) who all work together to provide you with the care you need, when you need it.  Your next appointment:   3 month(s)  Provider:   Lynnea Maizes or Barbarann Ehlers

## 2022-07-14 NOTE — Progress Notes (Signed)
Cardiology Office Note:    Date:  07/14/2022   ID:  Stewart, Sasaki 03-04-1951, MRN [id number]  PCP:  [name], MD   [place] Prov[facility] Cardiologist:  Sherren Mocha, MD     Referring MD: [name], MD   Chief Complaint  Patient presents with   Atrial Fibrillation    History of Present Illness:    Connor Martinez is a 72 y.o. male with a hx of: Coronary artery disease  S/p NSTEMI 8/21 tx with DES to [facility] and DES to [facility] Heart failure with reduced ejection fraction  Ischemic CM Echocardiogram 8/21: EF 45-50, Gr 2 DD Thoracic aortic aneurysm CT 8/21: 4.4 cm Followed by Dr. Joaquim Nam aortic ectasia Hypertension  Angioedema with ACEi Hyperlipidemia  Obesity  Tobacco use   The patient is here alone today. He has a lot of questions about his coronary stents and we discussed this today. He wondered why his arteries couldn't be cleaned out. I reviewed the limited utility of angioplasty and atherectomy as stand-alone treatments. From a symptomatic perspective, he is stable. He continues to complain of leg swelling and would like to work on weight loss to treat his swelling. He denies orthopnea or PND. No chest pain or pressure. He has fatigue and mild DOE without recent change.  He has quit smoking. Now very motivated to lose weight as he understands this is a big issue for him.   Past Medical History:  Diagnosis Date   A-fib (HCC)    Angioedema    BPV (benign positional vertigo)    Coronary artery disease    Dyspnea    Hemorrhoids    Hyperlipidemia    Hypertension    Myocardial infarction ([place])    Sciatica    MRI with disc herniations L3-4, L4-5 s/p epidural injections by Dr [name]    Past Surgical History:  Procedure Laterality Date   BACK SURGERY     CORONARY STENT INTERVENTION N/A 02/08/2020   Procedure: CORONARY STENT INTERVENTION;  Surgeon: Lorretta Harp, MD;  Location: [place] [facility];  Service: Cardiovascular;  Laterality: N/A;   RCA and CIRCULFLEX   INCISION AND DRAINAGE ABSCESS N/A 08/27/2021   Procedure: INCISION AND DRAINAGE OF PERIANAL ABSCESS;  Surgeon: Donnie Mesa, MD;  Location: [place];  Service: General;  Laterality: N/A;   LEFT HEART CATH AND CORONARY ANGIOGRAPHY N/A 02/08/2020   Procedure: LEFT HEART CATH AND CORONARY ANGIOGRAPHY;  Surgeon: Lorretta Harp, MD;  Location: [place] [facility];  Service: Cardiovascular;  Laterality: N/A;   SHOULDER SURGERY     Right    Current Medications: Current Meds  Medication Sig   apixaban (ELIQUIS) 5 MG TABS tablet Take 1 tablet (5 mg total) by mouth 2 (two) times daily.   atorvastatin (LIPITOR) 40 MG tablet TAKE 2 TABLETS EVERY DAY   Camphor-Eucalyptus-Menthol (VICKS VAPORUB EX) Apply 1 application topically daily as needed (congestion).   carvedilol (COREG) 12.5 MG tablet TAKE 1 TABLET TWICE DAILY WITH MEALS   furosemide (LASIX) 40 MG tablet TAKE 1 TABLET EVERY DAY   potassium chloride SA (KLOR-CON M) 20 MEQ tablet TAKE 1 TABLET EVERY DAY   [DISCONTINUED] nitroGLYCERIN (NITROSTAT) 0.4 MG SL tablet Place 1 tablet (0.4 mg total) under the tongue every 5 (five) minutes x 3 doses as needed for chest pain.     Allergies:   Ace inhibitors and Penicillins   Social History   Socioeconomic History   Marital status: Single  Spouse name: Not on file   Number of children: Not on file   Years of education: Not on file   Highest education level: Not on file  Occupational History   Not on file  Tobacco Use   Smoking status: Every Day    Packs/day: 0.80    Types: Cigarettes   Smokeless tobacco: Never  Vaping Use   Vaping Use: Never used  Substance and Sexual Activity   Alcohol use: Not Currently   Drug use: Not Currently    Types: Marijuana   Sexual activity: Not on file  Other Topics Concern   Not on file  Social History Narrative   Not on file   Social Determinants of Health   Financial Resource Strain: Low Risk  (12/15/2021)   Overall Financial  Resource Strain (CARDIA)    Difficulty of Paying Living Expenses: Not hard at all  Food Insecurity: No Food Insecurity (12/15/2021)   Hunger Vital Sign    Worried About Running Out of Food in the Last Year: Never true    [place] in the Last Year: Never true  Transportation Needs: No Transportation Needs (12/15/2021)   PRAPARE - Hydrologist (Medical): No    Lack of Transportation (Non-Medical): No  Physical Activity: Insufficiently Active (12/15/2021)   Exercise Vital Sign    Days of Exercise per Week: 1 day    Minutes of Exercise per Session: 30 min  Stress: No Stress Concern Present (12/15/2021)   [place]    Feeling of Stress : Not at all  Social Connections: Socially Isolated (12/15/2021)   Social Connection and Isolation Panel [NHANES]    Frequency of Communication with Friends and Family: More than three times a week    Frequency of Social Gatherings with Friends and Family: Once a week    Attends Religious Services: Never    Marine scientist or Organizations: No    Attends Music therapist: Never    Marital Status: Never married     Family History: The patient's family history includes Heart disease in his mother; Hepatitis C in his brother; Hypertension in his brother and mother; Stroke in his mother.  ROS:   Please see the history of present illness.    All other systems reviewed and are negative.  EKGs/Labs/Other Studies Reviewed:    The following studies were reviewed today: Echo 07/22/2021: 1. Left ventricular ejection fraction, by estimation, is 40 to 45%. The  left ventricle has mildly decreased function. The left ventricle  demonstrates regional wall motion abnormalities (see scoring  diagram/findings for description). The left ventricular   internal cavity size was moderately dilated. There is moderate concentric  left ventricular hypertrophy.  Left ventricular diastolic function could  not be evaluated. There is hypokinesis of the left ventricular, entire  inferolateral wall and inferior  wall.   2. Right ventricular systolic function is normal. The right ventricular  size is normal. There is mildly elevated pulmonary artery systolic  pressure. The estimated right ventricular systolic pressure is 18.2 mmHg.   3. Left atrial size was severely dilated.   4. Right atrial size was severely dilated.   5. The mitral valve is normal in structure. Trivial mitral valve  regurgitation.   6. The aortic valve is tricuspid. Aortic valve regurgitation is not  visualized. Aortic valve sclerosis is present, with no evidence of aortic  valve stenosis.   7. There  is mild dilatation of the ascending aorta, measuring 42 mm.   8. The inferior vena cava is normal in size with greater than 50%  respiratory variability, suggesting right atrial pressure of 3 mmHg.   Comparison(s): No significant change from prior study. Prior images  reviewed side by side. EF 45%, hypokinesis of the basal/mid inferior,  basal inferolateral and basal inferoseptal walls, ascen aorta 76m.   Cardiac Cath 02/08/2020: Mid RCA lesion is 95% stenosed. Mid Cx lesion is 99% stenosed. Lat 1st Diag lesion is 100% stenosed. Prox LAD to Mid LAD lesion is 50% stenosed. Mid LAD lesion is 50% stenosed. A drug-eluting stent was successfully placed using a SYNERGY XD 2.25X16. Post intervention, there is a 0% residual stenosis. A drug-eluting stent was successfully placed using a SYNERGY XD 2.25X24. Post intervention, there is a 0% residual stenosis.  EKG:  EKG is ordered today.  The ekg ordered today demonstrates atrial fibrillation 75 bpm, left axis deviation, LVH with QRS widening  Recent Labs: 07/17/2021: Magnesium 1.9; TSH 1.490 01/16/2022: ALT 15; BUN 10; Creatinine, Ser 0.81; Hemoglobin 14.3; Platelets 244.0; Potassium 3.5; Sodium 141  Recent Lipid Panel    Component  Value Date/Time   CHOL 140 01/16/2022 1124   CHOL 128 04/19/2020 0907   TRIG 92.0 01/16/2022 1124   HDL 35.90 (L) 01/16/2022 1124   HDL 44 04/19/2020 0907   CHOLHDL 4 01/16/2022 1124   VLDL 18.4 01/16/2022 1124   LDLCALC 85 01/16/2022 1124   LDLCALC 68 04/19/2020 0907     Risk Assessment/Calculations:    CHA2DS2-VASc Score = 4   This indicates a 4.8% annual risk of stroke. The patient's score is based upon: CHF History: 1 HTN History: 1 Diabetes History: 0 Stroke History: 0 Vascular Disease History: 1 Age Score: 1 Gender Score: 0               Physical Exam:    VS:  BP 120/80   Pulse 75   Ht '5\' 7"'$  (1.702 m)   Wt 228 lb (103.4 kg)   SpO2 97%   BMI 35.71 kg/m     Wt Readings from Last 3 Encounters:  07/14/22 228 lb (103.4 kg)  01/16/22 209 lb (94.8 kg)  08/27/21 213 lb (96.6 kg)     GEN:  Well nourished, well developed in no acute distress HEENT: Normal NECK: No JVD; No carotid bruits LYMPHATICS: No lymphadenopathy CARDIAC: Irregularly irregular, no murmurs, rubs, gallops RESPIRATORY:  Clear to auscultation without rales, wheezing or rhonchi  ABDOMEN: Soft, non-tender, non-distended MUSCULOSKELETAL: 1+ bilateral ankle edema; No deformity  SKIN: Warm and dry NEUROLOGIC:  Alert and oriented x 3 PSYCHIATRIC:  Normal affect   ASSESSMENT:    1. Persistent atrial fibrillation ([place]   2. Chronic systolic heart failure ([place]   3. Hyperlipidemia LDL goal <70    PLAN:    In order of problems listed above:  Discussed treatment strategies with the patient at length through a shared decision-making model.  The patient strongly prefers continued rate control rather than either electrical or pharmacologic cardioversion.  He is tolerating apixaban for anticoagulation.  He understands the need to work on lifestyle modification with a focus on exercise and weight loss.  He has quit smoking and states that his next goal is to focus on his weight.  Will arrange APP  follow-up in about 3 months. LVEF 40 to 45%.  I discussed the importance of GDMT with him.  He is currently only on carvedilol 12.5 mg  twice daily.  I discussed initiation of Jardiance and Entresto today.  He prefers to not start any new medications.  He wants to work on his weight loss and lifestyle.  I reviewed the importance of these medications and treating his heart failure.  He still prefers an approach of waiting at least 3 months to see how much headway he can make with weight loss.  I will arrange a follow-up echocardiogram to reassess his LV function prior to his APP follow-up visit. Treated with atorvastatin 40 mg daily.  Cholesterol 140, LDL 85, HDL 36.  Again, the patient will work on diet modification and weight loss.  I would have a low threshold to start him on Zetia if he is willing.           Medication Adjustments/Labs and Tests Ordered: Current medicines are reviewed at length with the patient today.  Concerns regarding medicines are outlined above.  Orders Placed This Encounter  Procedures   EKG 12-Lead   ECHOCARDIOGRAM COMPLETE   Meds ordered this encounter  Medications   nitroGLYCERIN (NITROSTAT) 0.4 MG SL tablet    Sig: Dissolve 1 tablet under the tongue every 5 minutes as needed for chest pain. Max of 3 doses, then 911.    Dispense:  25 tablet    Refill:  6    Patient Instructions  Medication Instructions:  Your physician recommends that you continue on your current medications as directed. Please refer to the Current Medication list given to you today.  *If you need a refill on your cardiac medications before your next appointment, please call your pharmacy*  Lab Work: NONE If you have labs (blood work) drawn today and your tests are completely normal, you will receive your results only by: [place] (if you have MyChart) OR A paper copy in the mail If you have any lab test that is abnormal or we need to change your treatment, we will call you to  review the results.  Testing/Procedures: ECHO (prior to next appt) Your physician has requested that you have an echocardiogram. Echocardiography is a painless test that uses sound waves to create images of your heart. It provides your doctor with information about the size and shape of your heart and how well your heart's chambers and valves are working. This procedure takes approximately one hour. There are no restrictions for this procedure. Please do NOT wear cologne, perfume, aftershave, or lotions (deodorant is allowed). Please arrive 15 minutes prior to your appointment time.  Follow-Up: At [facility], you and your health needs are our priority.  As part of our continuing mission to provide you with exceptional heart care, we have created designated Provider Care Teams.  These Care Teams include your primary Cardiologist (physician) and Advanced Practice Providers (APPs -  Physician Assistants and Nurse Practitioners) who all work together to provide you with the care you need, when you need it.  Your next appointment:   3 month(s)  Provider:   Lynnea Maizes or Lorenso Quarry, MD  07/14/2022 1:15 PM    [place]

## 2022-07-30 ENCOUNTER — Encounter (HOSPITAL_COMMUNITY): Payer: Self-pay | Admitting: *Deleted

## 2022-09-02 DIAGNOSIS — B351 Tinea unguium: Secondary | ICD-10-CM | POA: Diagnosis not present

## 2022-09-02 DIAGNOSIS — I872 Venous insufficiency (chronic) (peripheral): Secondary | ICD-10-CM | POA: Diagnosis not present

## 2022-09-02 DIAGNOSIS — L218 Other seborrheic dermatitis: Secondary | ICD-10-CM | POA: Diagnosis not present

## 2022-09-02 DIAGNOSIS — L304 Erythema intertrigo: Secondary | ICD-10-CM | POA: Diagnosis not present

## 2022-09-02 DIAGNOSIS — L821 Other seborrheic keratosis: Secondary | ICD-10-CM | POA: Diagnosis not present

## 2022-09-02 DIAGNOSIS — D225 Melanocytic nevi of trunk: Secondary | ICD-10-CM | POA: Diagnosis not present

## 2022-09-02 DIAGNOSIS — L814 Other melanin hyperpigmentation: Secondary | ICD-10-CM | POA: Diagnosis not present

## 2022-09-07 ENCOUNTER — Other Ambulatory Visit: Payer: Self-pay | Admitting: *Deleted

## 2022-09-07 DIAGNOSIS — I4819 Other persistent atrial fibrillation: Secondary | ICD-10-CM

## 2022-09-07 DIAGNOSIS — Z7901 Long term (current) use of anticoagulants: Secondary | ICD-10-CM

## 2022-09-07 MED ORDER — APIXABAN 5 MG PO TABS: 5.0000 mg | ORAL_TABLET | Freq: Two times a day (BID) | ORAL | 1 refills | Status: DC

## 2022-09-07 NOTE — Telephone Encounter (Signed)
Eliquis 5mg  refill request received. Patient is 72 years old, weight-103.4kg, Crea-0.81 on 01/16/22, Diagnosis-Afib, and last seen by Dr. Burt Knack on 07/14/22. Dose is appropriate based on dosing criteria. Will send in refill to requested pharmacy.

## 2022-09-22 ENCOUNTER — Ambulatory Visit (HOSPITAL_COMMUNITY): Payer: Medicare HMO | Attending: Cardiology

## 2022-09-22 DIAGNOSIS — E785 Hyperlipidemia, unspecified: Secondary | ICD-10-CM

## 2022-09-22 DIAGNOSIS — I5022 Chronic systolic (congestive) heart failure: Secondary | ICD-10-CM | POA: Diagnosis not present

## 2022-09-22 DIAGNOSIS — I4819 Other persistent atrial fibrillation: Secondary | ICD-10-CM | POA: Insufficient documentation

## 2022-09-22 LAB — ECHOCARDIOGRAM COMPLETE: S' Lateral: 4.9 cm

## 2022-10-13 ENCOUNTER — Ambulatory Visit: Payer: Medicare HMO | Admitting: Physician Assistant

## 2022-10-13 DIAGNOSIS — I5022 Chronic systolic (congestive) heart failure: Secondary | ICD-10-CM | POA: Insufficient documentation

## 2022-10-13 NOTE — Progress Notes (Deleted)
  Cardiology Office Note:    Date:  10/13/2022  ID:  Connor, Martinez 09-Aug-1950, MRN [id number] PCP: Pincus Sanes, MD  [facility] Providers Cardiologist:  Tonny Bollman, MD { Click to update primary MD,subspecialty MD or APP then REFRESH:1}    {Click to Open Review  :1}   Patient Profile:   Coronary artery disease  S/p NSTEMI 8/21 tx with 2.25 x 16 mm DES to mRCA and  2.25 x 24 mm DES to mLCx LHC 02/08/2020: LAD proximal 50, mid 50; lateral D1 100; LCx mid 99; RCA mid 95 (thrombotic) HFmrEF (heart failure with mildly reduced ejection fraction)  Ischemic CM // TTE 8/21: EF 45-50 // TTE 07/22/2021: EF 40-45 TTE 09/22/22: EF 40-45, moderate LVH, Inf/Inf-Lat HK, NL RVSF, severe LAE, trivial MR, AV sclerosis, RAP 3 Persistent atrial fibrillation  Thoracic aortic aneurysm Chest CTA 07/22/2021: Ascending thoracic aortic aneurysm 4.1 cm (recommend annual CT); borderline infrarenal AAA 3 cm (recommend AAA [place] in 2026); 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 Sharlene Dory aortic ectasia CT in 2023: 3 cm - needs AAA [place] in 2026  [place] Common Iliac Artery Aneurysms  CT 06/2021: R 2.1 cm; L 2.5 cm  Hypertension  Angioedema with ACEi Hyperlipidemia  Obesity  Tobacco use      History of Present Illness:   Connor Martinez is a 72 y.o. male who returns for follow-up on CAD, CHF, atrial fibrillation.  He was last seen by Dr. Excell Seltzer 07/14/2022.  At that time, patient preferred to defer cardioversion for atrial fibrillation.  He also preferred not to start any new medications for GDMT.  A follow-up echocardiogram demonstrated continued reduced LV function with an EF of 40-45.  He was working on weight loss at that time.***  ROS ***    Studies Reviewed:    EKG:  ***  *** Risk Assessment/Calculations:   {Does this patient have ATRIAL FIBRILLATION?:[phone]} No BP recorded.  {Refresh Note OR Click here to enter BP  :1}***       Physical Exam:   VS:  There were no  vitals taken for this visit.   Wt Readings from Last 3 Encounters:  07/14/22 228 lb (103.4 kg)  01/16/22 209 lb (94.8 kg)  08/27/21 213 lb (96.6 kg)    Physical Exam***    ASSESSMENT AND PLAN:   No problem-specific Assessment & Plan notes found for this encounter. ***    {Are you ordering a CV Procedure (e.g. stress test, cath, DCCV, TEE, etc)?   Press F2        :[id number]}  Dispo:  No follow-ups on file.  Signed, Tereso Newcomer, PA-C

## 2022-10-22 DIAGNOSIS — I872 Venous insufficiency (chronic) (peripheral): Secondary | ICD-10-CM | POA: Diagnosis not present

## 2022-10-22 DIAGNOSIS — L853 Xerosis cutis: Secondary | ICD-10-CM | POA: Diagnosis not present

## 2022-10-22 DIAGNOSIS — L304 Erythema intertrigo: Secondary | ICD-10-CM | POA: Diagnosis not present

## 2023-03-04 ENCOUNTER — Ambulatory Visit (INDEPENDENT_AMBULATORY_CARE_PROVIDER_SITE_OTHER): Payer: Medicare HMO | Admitting: Family Medicine

## 2023-03-04 ENCOUNTER — Encounter: Payer: Self-pay | Admitting: Family Medicine

## 2023-03-04 VITALS — BP 140/90 | HR 76 | Ht 67.0 in | Wt 225.4 lb

## 2023-03-04 DIAGNOSIS — Z125 Encounter for screening for malignant neoplasm of prostate: Secondary | ICD-10-CM

## 2023-03-04 DIAGNOSIS — N50812 Left testicular pain: Secondary | ICD-10-CM

## 2023-03-04 DIAGNOSIS — E782 Mixed hyperlipidemia: Secondary | ICD-10-CM | POA: Diagnosis not present

## 2023-03-04 DIAGNOSIS — F17200 Nicotine dependence, unspecified, uncomplicated: Secondary | ICD-10-CM

## 2023-03-04 DIAGNOSIS — R600 Localized edema: Secondary | ICD-10-CM | POA: Diagnosis not present

## 2023-03-04 DIAGNOSIS — I5022 Chronic systolic (congestive) heart failure: Secondary | ICD-10-CM

## 2023-03-04 DIAGNOSIS — I2510 Atherosclerotic heart disease of native coronary artery without angina pectoris: Secondary | ICD-10-CM

## 2023-03-04 DIAGNOSIS — I10 Essential (primary) hypertension: Secondary | ICD-10-CM

## 2023-03-04 DIAGNOSIS — I4819 Other persistent atrial fibrillation: Secondary | ICD-10-CM

## 2023-03-04 NOTE — Progress Notes (Signed)
New Patient Office Visit  Subjective    Patient ID: Connor Martinez, male    DOB: [birth year]  Age: 71 y.o. MRN: [id number]  CC:  Chief Complaint  Patient presents with   New Patient (Initial Visit)    HPI Connor Martinez presents to establish care  Patient was seeing Dr. Lawerance Bach prior to this, but prefers a male provider.  Patient taking carvedilol and Eliquis for atrial fibrillation.  Taking atorvastatin for coronary artery disease also taking furosemide and potassium.  Has had 3 stents placed in the past.  Sees Dr. Excell Seltzer for cardiology.  Patient occasionally gets swelling of the lips.  Sometimes will be left-sided or right-sided, upper or lower.  Also will have swelling of the tongue.  Began about 8 years ago.  Has had allergy testing of this was inconclusive.  Still occurs 8-10 times a year.  Patient states that he has had a right sided spinning sensation near his right ear that he call described as "twisty/turny.".  Occurs a few times a year.  No hearing changes associated with this.  Patient has had left testicle tenderness for the past 1.5 years.  He is not sexually active for the past several years.  He states when he was younger (teenager, 80s) he would put a rubber band around the end of his penis if masturbating because he thought he was trying to prevent "corpuscles" from leaving his body.  Patient has had elevated PSA in the past and had a biopsy of his prostate.  Was advised to have a repeat biopsy but never did this because he felt like it was not necessary.  Has never had any treatment for prostate cancer.  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  Not currently.  PMH: afib, cad,   PSH: r shoulder surgery.  Cervical fixation.   FH: no cancer, no family hx of diabetes.    Tobacco use: quit smoking last year - 30+ years.   Alcohol use: none for 3 years.   Drug use: none Marital status: divorced.   Employment: retired, worked in Publishing rights manager.   [employer]    Outpatient Encounter Medications as of 03/04/2023  Medication Sig   apixaban (ELIQUIS) 5 MG TABS tablet Take 1 tablet (5 mg total) by mouth 2 (two) times daily.   atorvastatin (LIPITOR) 40 MG tablet TAKE 2 TABLETS EVERY DAY   Camphor-Eucalyptus-Menthol (VICKS VAPORUB EX) Apply 1 application topically daily as needed (congestion).   carvedilol (COREG) 12.5 MG tablet TAKE 1 TABLET TWICE DAILY WITH MEALS   furosemide (LASIX) 40 MG tablet TAKE 1 TABLET EVERY DAY   nitroGLYCERIN (NITROSTAT) 0.4 MG SL tablet Dissolve 1 tablet under the tongue every 5 minutes as needed for chest pain. Max of 3 doses, then 911.   potassium chloride SA (KLOR-CON M) 20 MEQ tablet TAKE 1 TABLET EVERY DAY   No facility-administered encounter medications on file as of 03/04/2023.    Past Medical History:  Diagnosis Date   A-fib (HCC)    Angioedema    BPV (benign positional vertigo)    Coronary artery disease    Dyspnea    Hemorrhoids    Hyperlipidemia    Hypertension    Myocardial infarction (HCC)    Sciatica    MRI with disc herniations L3-4, L4-5 s/p epidural injections by Dr Glee Arvin    Past Surgical History:  Procedure Laterality Date   BACK SURGERY     CORONARY STENT INTERVENTION N/A  02/08/2020   Procedure: CORONARY STENT INTERVENTION;  Surgeon: Runell Gess, MD;  [facility]: [facility] INVASIVE CV LAB;  Service: Cardiovascular;  Laterality: N/A;  RCA and CIRCULFLEX   INCISION AND DRAINAGE ABSCESS N/A 08/27/2021   Procedure: INCISION AND DRAINAGE OF PERIANAL ABSCESS;  Surgeon: Manus Rudd, MD;  Location: [facility] OR;  Service: General;  Laterality: N/A;   LEFT HEART CATH AND CORONARY ANGIOGRAPHY N/A 02/08/2020   Procedure: LEFT HEART CATH AND CORONARY ANGIOGRAPHY;  Surgeon: Runell Gess, MD;  Location: [facility] INVASIVE CV LAB;  Service: Cardiovascular;  Laterality: N/A;   SHOULDER SURGERY     Right    Family History  Problem Relation Age of Onset   Stroke Mother    Hypertension Mother     Heart disease Mother    Hepatitis C Brother    Hypertension Brother     Social History   Socioeconomic History   Marital status: Single    Spouse name: Not on file   Number of children: Not on file   Years of education: Not on file   Highest education level: Not on file  Occupational History   Not on file  Tobacco Use   Smoking status: Every Day    Current packs/day: 0.80    Types: Cigarettes   Smokeless tobacco: Never  Vaping Use   Vaping status: Never Used  Substance and Sexual Activity   Alcohol use: Not Currently   Drug use: Not Currently    Types: Marijuana   Sexual activity: Not on file  Other Topics Concern   Not on file  Social History Narrative   Not on file   Social Determinants of Health   Financial Resource Strain: Low Risk  (12/15/2021)   Overall Financial Resource Strain (CARDIA)    Difficulty of Paying Living Expenses: Not hard at all  Food Insecurity: No Food Insecurity (12/15/2021)   Hunger Vital Sign    Worried About Running Out of Food in the Last Year: Never true    Ran Out of Food in the Last Year: Never true  Transportation Needs: No Transportation Needs (12/15/2021)   PRAPARE - Administrator, Civil Service (Medical): No    Lack of Transportation (Non-Medical): No  Physical Activity: Insufficiently Active (12/15/2021)   Exercise Vital Sign    Days of Exercise per Week: 1 day    Minutes of Exercise per Session: 30 min  Stress: No Stress Concern Present (12/15/2021)   [employer] of Occupational Health - Occupational Stress Questionnaire    Feeling of Stress : Not at all  Social Connections: Socially Isolated (12/15/2021)   Social Connection and Isolation Panel [NHANES]    Frequency of Communication with Friends and Family: More than three times a week    Frequency of Social Gatherings with Friends and Family: Once a week    Attends Religious Services: Never    Database administrator or Organizations: No    Attends Tax inspector Meetings: Never    Marital Status: Never married  Intimate Partner Violence: Not At Risk (12/15/2021)   Humiliation, Afraid, Rape, and Kick questionnaire    Fear of Current or Ex-Partner: No    Emotionally Abused: No    Physically Abused: No    Sexually Abused: No    ROS      Objective    BP (!) 140/90   Pulse 76   Ht 5\' 7"  (1.702 m)   Wt 225 lb 6.4 oz (102.2 kg)  SpO2 97%   BMI 35.30 kg/m   Physical Exam General: Alert, oriented HEENT: PERRLA, EOMI CV: Irregularly irregular rhythm, normal rate Pulmonary: Clear bilaterally GI: Soft, nontender. GU: No scrotal swelling.  Testicles grossly symmetric in size.  Epididymis appears to be enlarged in both testicles.  Testicles nontender.  Normal perineum.  No evidence of infection. MSK: Strength equal bilaterally, normal gait Psych: Pleasant     Assessment & Plan:   Coronary artery disease involving native coronary artery of native heart without angina pectoris -     Comprehensive metabolic panel; Future  Mixed hyperlipidemia Assessment & Plan: Continue current statin.  His last lipid panel was last year and LDL was 85.  Repeat lipid panel at next visit.  Orders: -     Lipid panel; Future  Morbid obesity (HCC) -     CBC; Future -     Comprehensive metabolic panel; Future -     Hemoglobin A1c; Future -     Lipid panel; Future  Screening for malignant neoplasm of prostate -     PSA; Future  Bilateral lower extremity edema Assessment & Plan: On Lasix.  Continue current dose.   Heart failure with mildly reduced ejection fraction (HFmrEF) (HCC) Assessment & Plan: Most recent EF 40 to 45%.  His cardiologist has discussed with him initiating additional medication such as Jardiance and Entresto but patient has declined this so far.  Does appear to follow-up regularly with Dr. Excell Seltzer.  Continue current management.  Continue to discussed with patient the benefits of optimal control of heart  failure.   Primary hypertension Assessment & Plan: Patient's blood pressure initially significantly elevated on machine.  Rechecked manually and was closer to goal but still elevated.  Patient declines additional medications at this time.  Advised patient to record his home readings which he states are lower, and bring them to [place] at next visit.  Will follow-up at next visit.   Persistent atrial fibrillation [facility]) Assessment & Plan: Follows with cardiology.  Continue carvedilol and Eliquis.   Tobacco use disorder Assessment & Plan: States he recently quit smoking.  Discussed CT lung cancer screening.  Patient declined at this time.  Continue ongoing discussion with patient at next visit.   Pain in left testicle Assessment & Plan: Nontender on exam.  No masses felt.  Epididymis does appear enlarged bilaterally..  Will get testicular ultrasound for further evaluation.     Return in about 4 weeks (around 04/01/2023) for HTN.   Sandre Kitty, MD

## 2023-03-04 NOTE — Patient Instructions (Signed)
It was nice to see you today,  We addressed the following topics today: Following medication list shows what your medications are being used for: - Eliquis/apixaban-this is to prevent blood clots from your atrial fibrillation - Carvedilol-this is to keep your heart rhythm regular to also prevent blood clots from your atrial fibrillation - Furosemide-this medicine makes you urinate and is to help keep the swelling of your legs down - Potassium-this medication is to keep your potassium level up.  Your potassium can be decreased by the Lasix medication. - Lipitor-this is a cholesterol medicine to help reduce your risk of heart attack and stroke  - I will send in an order for an ultrasound of your testicles to evaluate your testicular pain - I would like you to check your blood pressure at home and record the values and bring them back to [place] when I see you get in 1 month  Have a great day,  Frederic Jericho, MD

## 2023-03-06 DIAGNOSIS — N50812 Left testicular pain: Secondary | ICD-10-CM | POA: Insufficient documentation

## 2023-03-06 NOTE — Assessment & Plan Note (Signed)
States he recently quit smoking.  Discussed CT lung cancer screening.  Patient declined at this time.  Continue ongoing discussion with patient at next visit.

## 2023-03-06 NOTE — Assessment & Plan Note (Signed)
On Lasix.  Continue current dose.

## 2023-03-06 NOTE — Assessment & Plan Note (Signed)
Nontender on exam.  No masses felt.  Epididymis does appear enlarged bilaterally..  Will get testicular ultrasound for further evaluation.

## 2023-03-06 NOTE — Assessment & Plan Note (Addendum)
Patient's blood pressure initially significantly elevated on machine.  Rechecked manually and was closer to goal but still elevated.  Patient declines additional medications at this time.  Advised patient to record his home readings which he states are lower, and bring them to [place] at next visit.  Will follow-up at next visit.

## 2023-03-06 NOTE — Assessment & Plan Note (Signed)
Follows with cardiology.  Continue carvedilol and Eliquis.

## 2023-03-06 NOTE — Assessment & Plan Note (Signed)
Continue current statin.  His last lipid panel was last year and LDL was 85.  Repeat lipid panel at next visit.

## 2023-03-06 NOTE — Assessment & Plan Note (Signed)
Most recent EF 40 to 45%.  His cardiologist has discussed with him initiating additional medication such as Jardiance and Entresto but patient has declined this so far.  Does appear to follow-up regularly with Dr. Excell Seltzer.  Continue current management.  Continue to discussed with patient the benefits of optimal control of heart failure.

## 2023-03-25 ENCOUNTER — Other Ambulatory Visit: Payer: Medicare HMO

## 2023-03-25 DIAGNOSIS — E782 Mixed hyperlipidemia: Secondary | ICD-10-CM | POA: Diagnosis not present

## 2023-03-25 DIAGNOSIS — I2510 Atherosclerotic heart disease of native coronary artery without angina pectoris: Secondary | ICD-10-CM | POA: Diagnosis not present

## 2023-03-25 DIAGNOSIS — Z125 Encounter for screening for malignant neoplasm of prostate: Secondary | ICD-10-CM

## 2023-03-25 DIAGNOSIS — R7301 Impaired fasting glucose: Secondary | ICD-10-CM | POA: Diagnosis not present

## 2023-03-26 LAB — COMPREHENSIVE METABOLIC PANEL
ALT: 16 [IU]/L (ref 0–44)
AST: 21 [IU]/L (ref 0–40)
Albumin: 4 g/dL (ref 3.8–4.8)
Alkaline Phosphatase: 108 [IU]/L (ref 44–121)
BUN/Creatinine Ratio: 11 (ref 10–24)
BUN: 9 mg/dL (ref 8–27)
Bilirubin Total: 1 mg/dL (ref 0.0–1.2)
CO2: 25 mmol/L (ref 20–29)
Calcium: 9.2 mg/dL (ref 8.6–10.2)
Chloride: 102 mmol/L (ref 96–106)
Creatinine, Ser: 0.83 mg/dL (ref 0.76–1.27)
Globulin, Total: 3 g/dL (ref 1.5–4.5)
Glucose: 112 mg/dL — ABNORMAL HIGH (ref 70–99)
Potassium: 3.5 mmol/L (ref 3.5–5.2)
Sodium: 142 mmol/L (ref 134–144)
Total Protein: 7 g/dL (ref 6.0–8.5)
eGFR: 94 mL/min/{1.73_m2} (ref >59)

## 2023-03-26 LAB — CBC
Hematocrit: 43.3 % (ref 37.5–51.0)
Hemoglobin: 14.1 g/dL (ref 13.0–17.7)
MCH: 28.7 pg (ref 26.6–33.0)
MCHC: 32.6 g/dL (ref 31.5–35.7)
MCV: 88 fL (ref 79–97)
Platelets: 232 10*3/uL (ref 150–450)
RBC: 4.91 x10E6/uL (ref 4.14–5.80)
RDW: 13.4 % (ref 11.6–15.4)
WBC: 5.5 10*3/uL (ref 3.4–10.8)

## 2023-03-26 LAB — HEMOGLOBIN A1C
Est. average glucose Bld gHb Est-mCnc: 128 mg/dL
Hgb A1c MFr Bld: 6.1 % — ABNORMAL HIGH (ref 4.8–5.6)

## 2023-03-26 LAB — LIPID PANEL
Chol/HDL Ratio: 3.2 {ratio} (ref 0.0–5.0)
Cholesterol, Total: 126 mg/dL (ref 100–199)
HDL: 39 mg/dL — ABNORMAL LOW (ref >39)
LDL Chol Calc (NIH): 71 mg/dL (ref 0–99)
Triglycerides: 83 mg/dL (ref 0–149)
VLDL Cholesterol Cal: 16 mg/dL (ref 5–40)

## 2023-03-26 LAB — PSA: Prostate Specific Ag, Serum: 3.3 ng/mL (ref 0.0–4.0)

## 2023-04-01 ENCOUNTER — Encounter: Payer: Self-pay | Admitting: Family Medicine

## 2023-04-01 ENCOUNTER — Ambulatory Visit (INDEPENDENT_AMBULATORY_CARE_PROVIDER_SITE_OTHER): Payer: Medicare HMO | Admitting: Family Medicine

## 2023-04-01 VITALS — BP 147/109 | HR 80 | Ht 67.0 in | Wt 219.0 lb

## 2023-04-01 DIAGNOSIS — N50812 Left testicular pain: Secondary | ICD-10-CM | POA: Diagnosis not present

## 2023-04-01 DIAGNOSIS — I4819 Other persistent atrial fibrillation: Secondary | ICD-10-CM

## 2023-04-01 DIAGNOSIS — I5020 Unspecified systolic (congestive) heart failure: Secondary | ICD-10-CM

## 2023-04-01 DIAGNOSIS — I5022 Chronic systolic (congestive) heart failure: Secondary | ICD-10-CM

## 2023-04-01 DIAGNOSIS — I10 Essential (primary) hypertension: Secondary | ICD-10-CM | POA: Diagnosis not present

## 2023-04-01 MED ORDER — SPIRONOLACTONE 50 MG PO TABS: 50.0000 mg | ORAL_TABLET | Freq: Every day | ORAL | 3 refills | Status: DC

## 2023-04-01 NOTE — Progress Notes (Signed)
   Established Patient Office Visit  Subjective   Patient ID: Connor Martinez, male    DOB: 07/12/1950  Age: 72 y.o. MRN: [id number]  Chief Complaint  Patient presents with   Medical Management of Chronic Issues    HPI  Hypertension-we discussed patient's elevated blood pressure.  We discussed how the elevated blood pressure can worsen his heart failure.  Talked about the different medication options that were helpful for blood pressure and heart failure.  Patient visually agreed to take [facility] before we realized he had had listed allergy for angioedema to ACE inhibitors.  Also patient currently in the donut hole and Entresto likely to be much more expensive for him.  Patient agreed to spironolactone.  Patient complains he is in the donut hole currently and is cardiac medications are going to be more expensive.  Advised him I can send in a referral to the pharmacist to help with this issue.  Also advised him to talk to his cardiologist regarding samples or other ways they may be able to help with medication affordability.    The ASCVD Risk score (Arnett DK, et al., 2019) failed to calculate for the following reasons:   The patient has a prior MI or stroke diagnosis  Health Maintenance Due  Topic Date Due   Pneumonia Vaccine 34+ Years old (1 of 2 - PCV) Never done   Zoster Vaccines- Shingrix (2 of 2) 01/26/2013   DTaP/Tdap/Td (2 - Td or Tdap) 04/09/2021   Colonoscopy  05/25/2021   Medicare Annual Wellness (AWV)  12/16/2022   INFLUENZA VACCINE  Never done   COVID-19 Vaccine (1 - 2023-24 season) Never done      Objective:     BP (!) 147/109   Pulse 80   Ht 5\' 7"  (1.702 m)   Wt 219 lb (99.3 kg)   SpO2 97%   BMI 34.30 kg/m    Physical Exam General: Alert and oriented CV heart regular Pulmonary: Lungs clear bilaterally   No results found for any visits on 04/01/23.      Assessment & Plan:   Primary hypertension Assessment & Plan: Patient agreeable to adding a  medication for blood pressure/heart failure.  He originally agreed to [employer] but upon review of allergies he had angioedema to an ACE inhibitor, plus the fact it would be expensive outside the donut hole, we agreed to prescribe spironolactone. - Spironolactone 50 mg daily - Follow-up 1 month   Heart failure with mildly reduced ejection fraction (HFmrEF) (HCC) -     AMB Referral to Pharmacy Medication Management  Pain in left testicle Assessment & Plan: Placing scrotal ultrasound order.  Still with pain in the left testicle.  Orders: -     US SCROTUM W/DOPPLER; Future  Persistent atrial fibrillation [facility]) Assessment & Plan: Patient notes upcoming affordability issues with apixaban while in the donut hole.  Sending in referral to pharmacist for help with medication affordability during this time.  Also recommended he reach out to the cardiologist.  In the past patient has used samples at times to help with medications while in the donut hole.   Other orders -     Spironolactone; Take 1 tablet (50 mg total) by mouth daily.  Dispense: 30 tablet; Refill: 3     Return in about 4 weeks (around 04/29/2023) for HTN.    Sandre Kitty, MD

## 2023-04-01 NOTE — Patient Instructions (Addendum)
It was nice to see you today,  We addressed the following topics today: -I will send in spironolactone.  You will take this once a day.  This is a blood pressure medicine.  The Entresto showed you had an allergy to a similar medicine so I will send that message to Dr. Excell Seltzer. - If there is issues with the cost let us know.  I will also send in a referral to the pharmacist to see if they can help you with affording these medications while you are in the donut hole - You should also reach out to Dr. Earmon Phoenix office to see if they can provide you with samples of any of these medications or otherwise provide assistance for you while you are in the donut hole - Your A1c was good, no changes to your diabetes medications.   Have a great day,  Frederic Jericho, MD

## 2023-04-02 ENCOUNTER — Other Ambulatory Visit: Payer: Self-pay | Admitting: *Deleted

## 2023-04-02 ENCOUNTER — Telehealth: Payer: Self-pay

## 2023-04-02 ENCOUNTER — Telehealth: Payer: Self-pay | Admitting: Cardiovascular Disease

## 2023-04-02 DIAGNOSIS — I4819 Other persistent atrial fibrillation: Secondary | ICD-10-CM

## 2023-04-02 DIAGNOSIS — Z7901 Long term (current) use of anticoagulants: Secondary | ICD-10-CM

## 2023-04-02 MED ORDER — APIXABAN 5 MG PO TABS: 5.0000 mg | ORAL_TABLET | Freq: Two times a day (BID) | ORAL | 1 refills | Status: DC

## 2023-04-02 NOTE — Assessment & Plan Note (Signed)
Patient notes upcoming affordability issues with apixaban while in the donut hole.  Sending in referral to pharmacist for help with medication affordability during this time.  Also recommended he reach out to the cardiologist.  In the past patient has used samples at times to help with medications while in the donut hole.

## 2023-04-02 NOTE — Assessment & Plan Note (Addendum)
Patient agreeable to adding a medication for blood pressure/heart failure.  He originally agreed to [employer] but upon review of allergies he had angioedema to an ACE inhibitor, plus the fact it would be expensive outside the donut hole, we agreed to prescribe spironolactone. - Spironolactone 50 mg daily - Follow-up 1 month

## 2023-04-02 NOTE — Telephone Encounter (Signed)
Eliquis 5mg  refill request received. Patient is 72 years old, weight-99.3kg, Crea-0.83 on 03/25/23, Diagnosis-Afib, and last seen by Dr. Excell Seltzer on 07/14/22. Dose is appropriate based on dosing criteria. Will send in refill to requested pharmacy.    Called pt since [facility] was listed & after assessing chart noted it has never been sent there. He stated he does not use [facility] and prefers the mail order but since it will take too long, [facility] [address] Advised will send and he was thankful for the call.

## 2023-04-02 NOTE — Telephone Encounter (Signed)
*  STAT* If patient is at the pharmacy, call can be transferred to refill team.   1. Which medications need to be refilled? (please list name of each medication and dose if known) new prescription for Eliquis- changing pharmacy   2. Would you like to learn more about the convenience, safety, & potential cost savings by using the [facility]?     3. Are you open to using the [facility] (Type [facility].    4. Which pharmacy/location (including street and city if local pharmacy) is medication to be sent to?[facility] [address]   5. Do they need a 30 day or 90 day supply? 90 days and refills- please call today- out of medicine

## 2023-04-02 NOTE — Progress Notes (Signed)
   Care Guide Note  04/02/2023 Name: Connor Martinez MRN: [id number] DOB: July 19, 1950  Referred by: Sandre Kitty, MD Reason for referral : Care Coordination (Outreach to schedule with pharm d )   Connor Martinez is a 72 y.o. year old male who is a primary care patient of Sandre Kitty, MD. [name] was referred to the pharmacist for assistance related to HTN and HLD.    An unsuccessful telephone outreach was attempted today to contact the patient who was referred to the pharmacy team for assistance with medication assistance. Additional attempts will be made to contact the patient.   Penne Lash, RMA Care Guide [facility]  [place], [place] [address] Direct Dial: [phone] [email or web address]

## 2023-04-02 NOTE — Assessment & Plan Note (Signed)
Placing scrotal ultrasound order.  Still with pain in the left testicle.

## 2023-04-06 NOTE — Progress Notes (Signed)
   Care Guide Note  04/06/2023 Name: DANELL VERNO MRN: [id number] DOB: [birth year]  Referred by: Sandre Kitty, MD Reason for referral : Care Coordination (Outreach to schedule with pharm d )   JERIMIE MANCUSO is a 72 y.o. year old male who is a primary care patient of Sandre Kitty, MD. [name] was referred to the pharmacist for assistance related to HTN and HLD.    Successful contact was made with the patient to discuss pharmacy services including being ready for the pharmacist to call at least 5 minutes before the scheduled appointment time, to have medication bottles and any blood sugar or blood pressure readings ready for review. The patient agreed to meet with the pharmacist via with the pharmacist via telephone visit on (date/time).  04/08/2023  Penne Lash, RMA Care Guide [facility]  [place], [place] [address] Direct Dial: [phone] [email or web address]

## 2023-04-08 ENCOUNTER — Other Ambulatory Visit: Payer: Self-pay | Admitting: Pharmacist

## 2023-04-08 NOTE — Progress Notes (Signed)
04/08/2023 Name: Connor Martinez MRN: [id number] DOB: [birth year]  Chief Complaint  Patient presents with   Medication Assistance    Connor Martinez is a 72 y.o. year old male who presented for a telephone visit.   They were referred to the pharmacist by their PCP for assistance in managing medication access.     Subjective:  Care Team: Primary Care Provider: Sandre Kitty, MD  Medication Access/Adherence  Current Pharmacy:  [facility] - [place], [place] - [address] [place] [address] Phone: [phone] Fax: [phone]  [id number] [place], [place] - [address] [place] [address] Phone: [phone] Fax: [phone]   Patient reports affordability concerns with their medications: Yes , donut hole with eliquis   Atrial Fibrillation:  Current medications:  eliquis 5mg  BID, carvedilol 12.5mg  BID   Current medication access support: none at present   Objective:  Lab Results  Component Value Date   HGBA1C 6.1 (H) 03/25/2023    Lab Results  Component Value Date   CREATININE 0.83 03/25/2023   BUN 9 03/25/2023   NA 142 03/25/2023   K 3.5 03/25/2023   CL 102 03/25/2023   CO2 25 03/25/2023    Lab Results  Component Value Date   CHOL 126 03/25/2023   HDL 39 (L) 03/25/2023   LDLCALC 71 03/25/2023   TRIG 83 03/25/2023   CHOLHDL 3.2 03/25/2023    Medications Reviewed Today     Reviewed by Gabriel Carina, RPH (Pharmacist) on 04/08/23 at 1117  Med List Status: <None>   Medication Order Taking? Sig Documenting Provider Last Dose Status Informant  apixaban (ELIQUIS) 5 MG TABS tablet [id number] Yes Take 1 tablet (5 mg total) by mouth 2 (two) times daily. Tonny Bollman, MD Taking Active   atorvastatin (LIPITOR) 40 MG tablet [id number] Yes TAKE 2 TABLETS EVERY Su Monks, MD Taking Active   Camphor-Eucalyptus-Menthol [facility] [place]) [id number] Yes Apply  1 application topically daily as needed (congestion). [provider] Taking Active Self  carvedilol (COREG) 12.5 MG tablet [id number] Yes TAKE 1 TABLET TWICE DAILY WITH MEALS Tonny Bollman, MD Taking Active   furosemide (LASIX) 40 MG tablet [id number] Yes TAKE 1 TABLET EVERY Su Monks, MD Taking Active   nitroGLYCERIN (NITROSTAT) 0.4 MG SL tablet [id number] Yes Dissolve 1 tablet under the tongue every 5 minutes as needed for chest pain. Max of 3 doses, then 911. Tonny Bollman, MD Taking Active   potassium chloride SA (KLOR-CON M) 20 MEQ tablet [id number] Yes TAKE 1 TABLET EVERY Su Monks, MD Taking Active   spironolactone (ALDACTONE) 50 MG tablet [id number] Yes Take 1 tablet (50 mg total) by mouth daily. Sandre Kitty, MD Taking Active              Assessment/Plan:   Atrial Fibrillation: - Currently controlled - Reviewed importance of adherence to anticoagulant for stroke prevention. - Counseled on options for anticoagulation medications, recommend eliquis but understand cost constraint. Counseled on options for medication access, patient assistance program via [employer]. He states he may not complete this process but wants it mailed to him to review the application. Will faciliate with rx med assistance team. - Recommended [facility] also [facility], he is a Cytogeneticist. Explained that eliquis is much more affordable within their formulary. He will pursue.   Follow Up Plan: none scheduled at present, patient appreciative of  information and will review his options   Lynnda Shields, PharmD, BCPS Clinical Pharmacist [facility]

## 2023-04-09 ENCOUNTER — Other Ambulatory Visit: Payer: Self-pay

## 2023-04-09 DIAGNOSIS — I2510 Atherosclerotic heart disease of native coronary artery without angina pectoris: Secondary | ICD-10-CM

## 2023-04-09 DIAGNOSIS — E782 Mixed hyperlipidemia: Secondary | ICD-10-CM

## 2023-04-15 ENCOUNTER — Other Ambulatory Visit: Payer: Medicare HMO

## 2023-04-22 ENCOUNTER — Ambulatory Visit (INDEPENDENT_AMBULATORY_CARE_PROVIDER_SITE_OTHER): Payer: Medicare HMO

## 2023-04-22 DIAGNOSIS — Z0000 Encounter for general adult medical examination without abnormal findings: Secondary | ICD-10-CM | POA: Diagnosis not present

## 2023-04-22 NOTE — Progress Notes (Signed)
Subjective:   Connor Martinez is a 72 y.o. male who presents for Medicare Annual/Subsequent preventive examination.  Visit Complete: Virtual I connected with  Connor Martinez on 04/22/23 by a audio enabled telemedicine application and verified that I am speaking with the correct person using two identifiers.  Patient Location: Home  Provider Location: Office/Clinic  I discussed the limitations of evaluation and management by telemedicine. The patient expressed understanding and agreed to proceed.  Vital Signs: Because this visit was a virtual/telehealth visit, some criteria may be missing or patient reported. Any vitals not documented were not able to be obtained and vitals that have been documented are patient reported.  Patient Medicare AWV questionnaire was completed by the patient on 04/22/2023; I have confirmed that all information answered by patient is correct and no changes since this date.  Cardiac Risk Factors include: advanced age (>38men, >45 women);dyslipidemia;hypertension;male gender     Objective:    Today's Vitals   There is no height or weight on file to calculate BMI.     04/22/2023    2:07 PM 12/15/2021   11:37 AM 08/27/2021    7:34 AM 04/05/2017    2:40 AM 11/21/2016    2:09 AM  Advanced Directives  Does Patient Have a Medical Advance Directive? No No No No No  Would patient like information on creating a medical advance directive?  No - Patient declined No - Patient declined      Current Medications (verified) Outpatient Encounter Medications as of 04/22/2023  Medication Sig   apixaban (ELIQUIS) 5 MG TABS tablet Take 1 tablet (5 mg total) by mouth 2 (two) times daily.   atorvastatin (LIPITOR) 40 MG tablet TAKE 2 TABLETS EVERY DAY   Camphor-Eucalyptus-Menthol (VICKS VAPORUB EX) Apply 1 application topically daily as needed (congestion).   carvedilol (COREG) 12.5 MG tablet TAKE 1 TABLET TWICE DAILY WITH MEALS   furosemide (LASIX) 40 MG tablet TAKE 1 TABLET  EVERY DAY   nitroGLYCERIN (NITROSTAT) 0.4 MG SL tablet Dissolve 1 tablet under the tongue every 5 minutes as needed for chest pain. Max of 3 doses, then 911.   potassium chloride SA (KLOR-CON M) 20 MEQ tablet TAKE 1 TABLET EVERY DAY   spironolactone (ALDACTONE) 50 MG tablet Take 1 tablet (50 mg total) by mouth daily.   No facility-administered encounter medications on file as of 04/22/2023.    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, L4-5 s/p epidural injections by Dr Glee Arvin   Past Surgical History:  Procedure Laterality Date   BACK SURGERY     CORONARY STENT INTERVENTION N/A 02/08/2020   Procedure: CORONARY STENT INTERVENTION;  Surgeon: Runell Gess, MD;  Location: [facility];  Service: Cardiovascular;  Laterality: N/A;  RCA and CIRCULFLEX   INCISION AND DRAINAGE ABSCESS N/A 08/27/2021   Procedure: INCISION AND DRAINAGE OF PERIANAL ABSCESS;  Surgeon: Manus Rudd, MD;  Location: [facility] OR;  Service: General;  Laterality: N/A;   LEFT HEART CATH AND CORONARY ANGIOGRAPHY N/A 02/08/2020   Procedure: LEFT HEART CATH AND CORONARY ANGIOGRAPHY;  Surgeon: Runell Gess, MD;  Location: [facility];  Service: Cardiovascular;  Laterality: N/A;   SHOULDER SURGERY     Right   Family History  Problem Relation Age of Onset   Stroke Mother    Hypertension Mother    Heart disease Mother    Hepatitis C Brother    Hypertension Brother    Social History   Socioeconomic History   Marital status: Single    Spouse name: Not on file   Number of children: Not on file   Years of education: Not on file   Highest education level: Not on file  Occupational History   Not on file  Tobacco Use    Smoking status: Former    Types: Cigarettes    Start date: 03/14/2022   Smokeless tobacco: Never  Vaping Use   Vaping status: Never Used  Substance and Sexual Activity   Alcohol use: Not Currently   Drug use: Not Currently    Types: Marijuana   Sexual activity: Not on file  Other Topics Concern   Not on file  Social History Narrative   Not on file   Social Determinants of Health   Financial Resource Strain: Low Risk  (04/22/2023)   Overall Financial Resource Strain (CARDIA)    Difficulty of Paying Living Expenses: Not hard at all  Food Insecurity: No Food Insecurity (12/15/2021)   Hunger Vital Sign    Worried About Running Out of Food in the Last Year: Never true    Ran Out of Food in the Last Year: Never true  Transportation Needs: No Transportation Needs (04/22/2023)   PRAPARE - Administrator, Civil Service (Medical): No    Lack of Transportation (Non-Medical): No  Physical Activity: Inactive (04/22/2023)   Exercise Vital Sign    Days of Exercise per Week: 0 days    Minutes of Exercise per Session: 0 min  Stress: No Stress Concern Present (04/22/2023)   [employer] of Occupational Health - Occupational Stress Questionnaire    Feeling of Stress : Not at all  Social Connections: Unknown (04/22/2023)   Social Connection and Isolation Panel [NHANES]    Frequency of Communication with Friends and Family: More than three times a week    Frequency of Social Gatherings with Friends and Family: Once a week    Attends Religious Services: Not on Marketing executive or Organizations: No    Attends Banker Meetings: Never    Marital Status: Divorced    Tobacco Counseling Counseling given: Not Answered   Clinical Intake:  Pre-visit preparation completed: Yes  Pain : No/denies pain     Nutritional Risks: None Diabetes: No  How often do you need to have someone help you when you read instructions, pamphlets, or other written  materials from your doctor or pharmacy?: 1 - Never  Interpreter Needed?: No  Information entered by :: NAllen LPN   Activities of Daily Living    04/22/2023    1:52 PM  In your present state of health, do you have any difficulty performing the following activities:  Hearing? 0  Vision? 0  Difficulty concentrating or making decisions? 0  Walking or climbing stairs? 0  Dressing or bathing? 0  Doing errands, shopping? 0  Preparing Food and eating ? N  Using the Toilet? N  In the past six months, have you accidently leaked urine? N  Do you have problems with loss of bowel control? N  Managing your Medications? N  Managing your Finances? N  Housekeeping or managing your Housekeeping? N    Patient Care Team: Sandre Kitty, MD as PCP - General (Family Medicine) Tonny Bollman, MD as PCP - Cardiology (Cardiology) Kathyrn Sheriff, [facility] (Inactive) as Pharmacist (Pharmacist)  Indicate any recent Medical Services you may have received from other than [facility] providers in the past year (date may be approximate).     Assessment:   This is a routine wellness examination for Connor Martinez.  Hearing/Vision screen Hearing Screening - Comments:: Denies hearing issues Vision Screening - Comments:: No regular eye exams   Goals Addressed             This Visit's Progress    Patient Stated       04/22/2023, trying to stay under 220 pounds, would like to get down to 185 pounds       Depression Screen    04/22/2023    2:10 PM 04/01/2023    3:09 PM 03/04/2023    3:33 PM 01/16/2022   11:18 AM 12/15/2021   11:37 AM 10/25/2019    2:02 PM 04/05/2019    1:44 PM  PHQ 2/9 Scores  PHQ - 2 Score 0 2 0 0 0 0 0  PHQ- 9 Score  8 0 0       Fall Risk    04/22/2023    1:52 PM 03/04/2023    3:33 PM 01/16/2022   11:18 AM 12/15/2021   11:36 AM 10/25/2019    2:02 PM  Fall Risk   Falls in the past year? 0 0 0 0 0  Number falls in past yr: 0 0 0 0 0  Injury with Fall? 0 0 0 0 0  Risk for fall due  to : Medication side effect No Fall Risks No Fall Risks No Fall Risks   Follow up Falls prevention discussed;Falls evaluation completed Falls evaluation completed Falls evaluation completed Falls evaluation completed Falls evaluation completed    MEDICARE RISK AT HOME: Medicare Risk at Home Any stairs in or around the home?: Yes If so, are there any without handrails?: No Home free of loose throw rugs in walkways, pet beds, electrical cords, etc?: Yes Adequate lighting in your home to reduce risk of falls?: Yes Life alert?: No Use of a cane, walker or w/c?: No Grab bars in the bathroom?: No Shower chair or bench in shower?: No Elevated toilet seat or a handicapped toilet?: No  TIMED UP AND GO:  Was the test performed?  No    Cognitive Function:        04/22/2023    2:10 PM 12/15/2021   12:03 PM  6CIT Screen  What Year? 0 points 0 points  What month? 0 points 0 points  What time? 0 points 0 points  Count back from 20 0 points 0 points  Months in reverse 0 points 0 points  Repeat phrase 2 points 0 points  Total Score 2 points 0 points    Immunizations Immunization History  Administered Date(s) Administered   Tdap 04/10/2011   Zoster Recombinant(Shingrix) 12/01/2012    TDAP status: Due, Education has been provided regarding the importance of this vaccine. Advised may receive this vaccine at local pharmacy or Health Dept. Aware to provide a copy of the vaccination record if obtained from local pharmacy or Health Dept. Verbalized acceptance and understanding.  Flu Vaccine status: Declined, Education has been provided regarding the importance of this vaccine but patient still declined. Advised may receive this vaccine at local pharmacy or Health Dept.  Aware to provide a copy of the vaccination record if obtained from local pharmacy or Health Dept. Verbalized acceptance and understanding.  Pneumococcal vaccine status: Due, Education has been provided regarding the importance  of this vaccine. Advised may receive this vaccine at local pharmacy or Health Dept. Aware to provide a copy of the vaccination record if obtained from local pharmacy or Health Dept. Verbalized acceptance and understanding.  Covid-19 vaccine status: Information provided on how to obtain vaccines.   Qualifies for Shingles Vaccine? Yes   Zostavax completed No   Shingrix Completed?: No.    Education has been provided regarding the importance of this vaccine. Patient has been advised to call insurance company to determine out of pocket expense if they have not yet received this vaccine. Advised may also receive vaccine at local pharmacy or Health Dept. Verbalized acceptance and understanding.  Screening Tests Health Maintenance  Topic Date Due   Zoster Vaccines- Shingrix (2 of 2) 01/26/2013   Pneumonia Vaccine 18+ Years old (1 of 1 - PCV) Never done   DTaP/Tdap/Td (2 - Td or Tdap) 04/09/2021   Colonoscopy  05/25/2021   COVID-19 Vaccine (1 - 2023-24 season) Never done   INFLUENZA VACCINE  09/20/2023 (Originally 01/21/2023)   Medicare Annual Wellness (AWV)  04/21/2024   Hepatitis C Screening  Completed   HPV VACCINES  Aged Out    Health Maintenance  Health Maintenance Due  Topic Date Due   Zoster Vaccines- Shingrix (2 of 2) 01/26/2013   Pneumonia Vaccine 4+ Years old (1 of 1 - PCV) Never done   DTaP/Tdap/Td (2 - Td or Tdap) 04/09/2021   Colonoscopy  05/25/2021   COVID-19 Vaccine (1 - 2023-24 season) Never done    Colorectal cancer screening:    Lung Cancer Screening: (Low Dose CT Chest recommended if Age 71-80 years, 20 pack-year currently smoking OR have quit w/in 15years.) does not qualify.   Lung Cancer Screening Referral: none  Additional Screening:  Hepatitis C Screening: does qualify; Completed 03/18/2016  Vision Screening: Recommended annual ophthalmology exams for early detection of glaucoma and other disorders of the eye. Is the patient up to date with their annual eye  exam?  No  Who is the provider or what is the name of the office in which the patient attends annual eye exams? none If pt is not established with a provider, would they like to be referred to a provider to establish care? No .   Dental Screening: Recommended annual dental exams for proper oral hygiene  Diabetic Foot Exam: n/a  Community Resource Referral / Chronic Care Management: CRR required this visit?  No   CCM required this visit?  No     Plan:     I have personally reviewed and noted the following in the patient's chart:   Medical and social history Use of alcohol, tobacco or illicit drugs  Current medications and supplements including opioid prescriptions. Patient is not currently taking opioid prescriptions. Functional ability and status Nutritional status Physical activity Advanced directives List of other physicians Hospitalizations, surgeries, and ER visits in previous 12 months Vitals Screenings to include cognitive, depression, and falls Referrals and appointments  In addition, I have reviewed and discussed with patient certain preventive protocols, quality metrics, and best practice recommendations. A written personalized care plan for preventive services as well as general preventive health recommendations were provided to patient.     Barb Merino, LPN   [id number]   After Visit Summary: (MyChart) Due to this  being a telephonic visit, the after visit summary with patients personalized plan was offered to patient via MyChart   Nurse Notes: none

## 2023-04-22 NOTE — Patient Instructions (Addendum)
Connor Martinez , Thank you for taking time to come for your Medicare Wellness Visit. I appreciate your ongoing commitment to your health goals. Please review the following plan we discussed and let me know if I can assist you in the future.   Referrals/Orders/Follow-Ups/Clinician Recommendations: none  This is a list of the screening recommended for you and due dates:  Health Maintenance  Topic Date Due   Zoster (Shingles) Vaccine (2 of 2) 01/26/2013   Pneumonia Vaccine (1 of 1 - PCV) Never done   DTaP/Tdap/Td vaccine (2 - Td or Tdap) 04/09/2021   Colon Cancer Screening  05/25/2021   COVID-19 Vaccine (1 - 2023-24 season) Never done   Flu Shot  09/20/2023*   Medicare Annual Wellness Visit  04/21/2024   Hepatitis C Screening  Completed   HPV Vaccine  Aged Out  *Topic was postponed. The date shown is not the original due date.    Advanced directives: (ACP Link)Information on Advanced Care Planning can be found at [facility] Advance Health Care Directives Advance Health Care Directives ([email or web address]   Next Medicare Annual Wellness Visit scheduled for next year: No, schedule is not open for next year  insert Preventive Care attachment Insert FALL PREVENTION attachment if needed

## 2023-04-23 ENCOUNTER — Telehealth: Payer: Self-pay | Admitting: *Deleted

## 2023-04-23 NOTE — Progress Notes (Signed)
  Care Coordination   Note   04/23/2023 Name: Connor Martinez MRN: [id number] DOB: 31-Mar-1951  Connor Martinez is a 71 y.o. year old male who sees Sandre Kitty, MD for primary care. I reached out to [name] by phone today to offer care coordination services.  [name] was given information about Care Coordination services today including:   The Care Coordination services include support from the care team which includes your Nurse Coordinator, Clinical Social Worker, or Pharmacist.  The Care Coordination team is here to help remove barriers to the health concerns and goals most important to you. Care Coordination services are voluntary, and the patient may decline or stop services at any time by request to their care team member.   Care Coordination Consent Status: Patient agreed to services and verbal consent obtained.   Follow up plan:  Telephone appointment with care coordination team member scheduled for:  11/12  Encounter Outcome:  Patient Scheduled  [facility] Coordination Care Guide  Direct Dial: [phone]

## 2023-04-26 ENCOUNTER — Telehealth: Payer: Self-pay

## 2023-04-26 NOTE — Telephone Encounter (Signed)
-----   Message from Gabriel Carina sent at 04/08/2023  6:05 PM EDT ----- Will you please initiate application for Eliquis 5mg  BID via [employer]?  Thank you, Thurston Hole

## 2023-04-28 ENCOUNTER — Ambulatory Visit: Payer: Medicare HMO | Admitting: Family Medicine

## 2023-04-28 ENCOUNTER — Other Ambulatory Visit (HOSPITAL_COMMUNITY): Payer: Self-pay

## 2023-04-28 ENCOUNTER — Telehealth: Payer: Self-pay

## 2023-04-28 ENCOUNTER — Other Ambulatory Visit: Payer: Self-pay | Admitting: Cardiovascular Disease

## 2023-04-28 NOTE — Telephone Encounter (Signed)
Request received for Eliquis PAP. Reached out to obtain necessary info. Need HH size, annual gross income, and out of pocket prescription expenses.

## 2023-05-04 ENCOUNTER — Ambulatory Visit: Payer: Self-pay | Admitting: Licensed Clinical Social Worker

## 2023-05-04 NOTE — Patient Outreach (Signed)
  Care Coordination   Initial Visit Note   05/04/2023 Name: Connor Martinez MRN: [id number] DOB: [birth year]  Connor Martinez is a 72 y.o. year old male who sees Sandre Kitty, MD for primary care. I spoke with  [name] by phone today.  What matters to the patients health and wellness today?  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  Patient is willing to pay for someone to help him and the SW stated that she will email and mail some resources to him         SDOH assessments and interventions completed:  Yes  SDOH Interventions Today    Flowsheet Row Most Recent Value  SDOH Interventions   Food Insecurity Interventions Intervention Not Indicated  Housing Interventions Other (Comment)  [Patient is in need of someone to clean and declutter his homw]  Transportation Interventions Intervention Not Indicated  Utilities Interventions Intervention Not Indicated        Care Coordination Interventions:  Yes, provided  Interventions Today    Flowsheet Row Most Recent Value  General Interventions   General Interventions Discussed/Reviewed General Interventions Discussed, [employer] is in need of someone to clean and declutter his homw, Sw will mail and email a list of services]        Follow up plan: Follow up call scheduled for 06/21/2023 at 3:15pm    Encounter Outcome:  Patient Visit Completed   Jeanie Cooks, PhD [facility], [facility] Social Worker Direct Dial: [phone]  Fax: [phone]

## 2023-05-04 NOTE — Patient Instructions (Signed)
Visit Information  Thank you for taking time to visit with me today. Please don't hesitate to contact me if I can be of assistance to you.   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  Patient is willing to pay for someone to help him and the SW stated that she will email and mail some resources to him         Our next appointment is by telephone on 06/21/2023 at 3:15pm  Please call the care guide team at [phone] if you need to cancel or reschedule your appointment.   If you are experiencing a Mental Health or Behavioral Health Crisis or need someone to talk to, please call the Suicide and Crisis Lifeline: 988 go to [facility] Urgent [facility] [address], [place] [phone]) call 911  Patient verbalizes understanding of instructions and care plan provided today and agrees to view in MyChart. Active MyChart status and patient understanding of how to access instructions and care plan via MyChart confirmed with patient.     Jeanie Cooks, PhD [facility], [facility] Social Worker Direct Dial: [phone]  Fax: [phone]

## 2023-05-10 NOTE — Telephone Encounter (Signed)
PAP: PAP application for ELIQUIS, [employer] ([employer])) has been mailed to pt's home address on file. Will fax provider portion of application to provider's office when pt's portion is received.

## 2023-06-17 ENCOUNTER — Other Ambulatory Visit: Payer: Self-pay

## 2023-06-17 ENCOUNTER — Other Ambulatory Visit: Payer: Self-pay | Admitting: Family Medicine

## 2023-06-17 MED ORDER — SPIRONOLACTONE 50 MG PO TABS: 50.0000 mg | ORAL_TABLET | Freq: Every day | ORAL | 0 refills | Status: DC

## 2023-06-17 MED ORDER — SPIRONOLACTONE 50 MG PO TABS: 50.0000 mg | ORAL_TABLET | Freq: Every day | ORAL | 3 refills | Status: DC

## 2023-06-21 ENCOUNTER — Ambulatory Visit: Payer: Self-pay | Admitting: Licensed Clinical Social Worker

## 2023-06-21 NOTE — Patient Outreach (Signed)
  Care Coordination   Follow Up Visit Note   06/21/2023 Name: Connor Martinez MRN: [id number] DOB: 09-Mar-1951  Connor Martinez is a 72 y.o. year old male who sees Sandre Kitty, MD for primary care. I spoke with  [name] by phone today.  What matters to the patients health and wellness today?  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  Patient is willing to pay for someone to help him and the SW stated that she will email and mail some resources to him         SDOH assessments and interventions completed:  Yes  SDOH Interventions Today    Flowsheet Row Most Recent Value  SDOH Interventions   Food Insecurity Interventions Intervention Not Indicated  Housing Interventions Intervention Not Indicated  Transportation Interventions Intervention Not Indicated  Utilities Interventions Intervention Not Indicated        Care Coordination Interventions:  Yes, provided  Interventions Today    Flowsheet Row Most Recent Value  General Interventions   General Interventions Discussed/Reviewed General Interventions Reviewed  [Patient received resources in the mail and will hire the [employer], they will come and do an assessment in January]        Follow up plan: No further intervention required.   Encounter Outcome:  Patient Visit Completed   Jeanie Cooks, PhD [facility], [facility] Social Worker Direct Dial: [phone]  Fax: [phone]

## 2023-06-21 NOTE — Patient Instructions (Signed)
Visit Information  Thank you for taking time to visit with me today. Please don't hesitate to contact me if I can be of assistance to you.   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  Patient is willing to pay for someone to help him and the SW stated that she will email and mail some resources to him        No further follow up needed, SW encouraged the patient to inform the PCP if he needs to be scheduled on the SW calendar.   Please call the care guide team at [phone] if you need to cancel or reschedule your appointment.   If you are experiencing a Mental Health or Behavioral Health Crisis or need someone to talk to, please call the Suicide and Crisis Lifeline: 988 go to [facility] Urgent [facility] [address], [place] [phone]) call 911  Patient verbalizes understanding of instructions and care plan provided today and agrees to view in MyChart. Active MyChart status and patient understanding of how to access instructions and care plan via MyChart confirmed with patient.     Jeanie Cooks, PhD [facility], [facility] Social Worker Direct Dial: [phone]  Fax: [phone]

## 2023-06-27 ENCOUNTER — Other Ambulatory Visit: Payer: Self-pay | Admitting: Cardiovascular Disease

## 2023-06-29 ENCOUNTER — Other Ambulatory Visit: Payer: Self-pay | Admitting: Family Medicine

## 2023-07-12 ENCOUNTER — Other Ambulatory Visit: Payer: Self-pay

## 2023-07-12 MED ORDER — POTASSIUM CHLORIDE CRYS ER 20 MEQ PO TBCR: 20.0000 meq | EXTENDED_RELEASE_TABLET | Freq: Every day | ORAL | 0 refills | Status: DC

## 2023-07-19 ENCOUNTER — Other Ambulatory Visit: Payer: Self-pay | Admitting: Cardiovascular Disease

## 2023-07-19 DIAGNOSIS — I4819 Other persistent atrial fibrillation: Secondary | ICD-10-CM

## 2023-07-19 DIAGNOSIS — Z7901 Long term (current) use of anticoagulants: Secondary | ICD-10-CM

## 2023-07-19 NOTE — Telephone Encounter (Signed)
Prescription refill request for Eliquis received. Indication:afib Last office visit:1/24 Scr:0.83  10/24 Age: 73 Weight:99.3  kg  Prescription refilled

## 2023-08-21 ENCOUNTER — Other Ambulatory Visit: Payer: Self-pay | Admitting: Cardiovascular Disease

## 2023-09-13 ENCOUNTER — Other Ambulatory Visit: Payer: Self-pay | Admitting: Cardiovascular Disease

## 2024-01-03 ENCOUNTER — Telehealth: Payer: Self-pay | Admitting: Cardiovascular Disease

## 2024-01-03 MED ORDER — POTASSIUM CHLORIDE CRYS ER 20 MEQ PO TBCR: 20.0000 meq | EXTENDED_RELEASE_TABLET | Freq: Every day | ORAL | 0 refills | Status: DC

## 2024-01-03 NOTE — Telephone Encounter (Signed)
*  STAT* If patient is at the pharmacy, call can be transferred to refill team.   1. Which medications need to be refilled? (please list name of each medication and dose if known) Potassium Chloride    2. Would you like to learn more about the convenience, safety, & potential cost savings by using the [facility]?     3. Are you open to using the [facility] (Type [facility].   4. Which pharmacy/location (including street and city if local pharmacy) is medication to be sent to?[facility]   5. Do they need a 30 day or 90 day supply? 90 days and refills

## 2024-01-03 NOTE — Telephone Encounter (Signed)
 RX sent in to requested Pharmacy

## 2024-01-21 ENCOUNTER — Ambulatory Visit: Attending: Emergency Medicine | Admitting: Emergency Medicine

## 2024-01-21 ENCOUNTER — Encounter: Payer: Self-pay | Admitting: Emergency Medicine

## 2024-01-21 VITALS — BP 138/90 | HR 99 | Ht 67.0 in | Wt 230.0 lb

## 2024-01-21 DIAGNOSIS — I7121 Aneurysm of the ascending aorta, without rupture: Secondary | ICD-10-CM

## 2024-01-21 DIAGNOSIS — I2510 Atherosclerotic heart disease of native coronary artery without angina pectoris: Secondary | ICD-10-CM | POA: Diagnosis not present

## 2024-01-21 DIAGNOSIS — I4819 Other persistent atrial fibrillation: Secondary | ICD-10-CM

## 2024-01-21 DIAGNOSIS — I10 Essential (primary) hypertension: Secondary | ICD-10-CM

## 2024-01-21 DIAGNOSIS — I5022 Chronic systolic (congestive) heart failure: Secondary | ICD-10-CM | POA: Diagnosis not present

## 2024-01-21 DIAGNOSIS — E785 Hyperlipidemia, unspecified: Secondary | ICD-10-CM | POA: Diagnosis not present

## 2024-01-21 MED ORDER — SPIRONOLACTONE 50 MG PO TABS: 50.0000 mg | ORAL_TABLET | Freq: Every day | ORAL | 3 refills | Status: AC

## 2024-01-21 MED ORDER — EMPAGLIFLOZIN 10 MG PO TABS: 10.0000 mg | ORAL_TABLET | Freq: Every day | ORAL | 3 refills | Status: AC

## 2024-01-21 MED ORDER — CARVEDILOL 25 MG PO TABS: 25.0000 mg | ORAL_TABLET | Freq: Two times a day (BID) | ORAL | 3 refills | Status: AC

## 2024-01-21 NOTE — Progress Notes (Signed)
 Cardiology Office Note:    Date:  01/21/2024  ID:  Connor Martinez 04-03-1951, MRN [id number] PCP: Connor Toribio POUR, MD  [facility] Providers Cardiologist:  Connor Fell, MD Cardiology APP:  Connor Lum CROME, NP       Patient Profile:       Chief Complaint: 1 year follow-up History of Present Illness:  Connor Martinez is a 73 y.o. male with visit-pertinent history of coronary artery disease s/p NSTEMI, coronary artery disease, chronic HFrEF, ICM, ascending aortic aneurysm, hypertension, abdominal aortic ectasia, hyperlipidemia, tobacco use, angioedema, morbid obesity  In August 2021 he was admitted with NSTEMI.  Cardiac catheterization revealed p-mLAD 50%, mLAD 50%, mRCA 95%, and midCx 99%. He underwent successful PCI/DES to mid RCA and mid circumflex. 12 month DAPT was recommended. Echo revealed LVEF 45-50%, moderate LVH, G2DD, normal RV, mild to moderate LAE, mild aortic sclerosis with no evidence of stenosis, dilation of the ascending aorta measuring 44 mm. Follow-up CT showed stable ascending aorta measuring 44 mm, and abdominal aortic ectasia measuring 2.6 cm. Annual imaging recommended. ACE-I/ARB/ARNI not initiated due to history of angioedema. He has not followed up on a consistent basis since that time.   He was seen for follow-up on 07/17/2021 for preoperative evaluation for perianal abscess.  He was found to be in new onset atrial fibrillation during visit.  Aspirin  and Brilinta  were stopped at that time and he was started on Eliquis .  CTA 06/2021 revealed stable ascending aortic aneurysm of 4.1 cm.  Echocardiogram slightly reduced pumping function at 40-45%, down from previous 45-50%, wall motion abnormalities similar compared to previous, mildly elevated lung pressures, severely dilated bilateral aorta.  He was last seen in office on 07/14/2022 by Dr. Fell.  The patient strongly prefers continued rate control rather than electrical pharmacological cardioversion for his  atrial fibrillation.  He was tolerating Eliquis  well.  Further GDMT not initiated as patient prefer not to start any new medications.  Echocardiogram 09/2022 showed LVEF 40 to 45%, moderate LVH, hypokinesis of left ventricular, basal inferior wall, basal-mid inferolateral wall, RV function size normal.  He was lost to follow-up after echocardiogram.  Since last seen he was placed on spironolactone  50 mg by his PCP.   Discussed the use of AI scribe software for clinical note transcription with the patient, who gave verbal consent to proceed.  History of Present Illness Connor Martinez is a 73 year old male with persistent atrial fibrillation who presents for evaluation of his heart condition and medication management.  Today patient tells me he feels good.  He denies any chest pains.  He reports chronic dyspnea on exertion that is unchanged.  He also reports chronic lower extremity swelling that is unchanged.  He reports his symptoms are likely related to his diet, sedentary lifestyle, and weight.  He recently completed a fast in June, which reduced ankle swelling and lowered blood pressure, but symptoms returned after resuming his regular diet.  He frequently goes to [employer].  He is considering fasting again and improving his diet to manage his condition.  Denies any syncope, presyncope, lightheadedness, dizziness, hematochezia, melena  Review of systems:  Please see the history of present illness. All other systems are reviewed and otherwise negative.      Studies Reviewed:    EKG Interpretation Date/Time:  Friday January 21 2024 08:16:22 EDT Ventricular Rate:  99 PR Interval:    QRS Duration:  138 QT Interval:  394 QTC Calculation: 505 R Axis:   -  64  Text Interpretation: Atrial fibrillation Left bundle branch block Confirmed by Connor Martinez [phone]) on 01/21/2024 9:50:10 AM    Echocardiogram 09/22/2022  1. Left ventricular ejection fraction, by estimation, is 40 to 45%. The  left  ventricle has mildly decreased function. The left ventricle  demonstrates regional wall motion abnormalities (see scoring  diagram/findings for description). The left ventricular   internal cavity size was mildly dilated. There is moderate concentric  left ventricular hypertrophy. Left ventricular diastolic function could  not be evaluated. There is hypokinesis of the left ventricular, basal  inferior wall. There is hypokinesis of  the left ventricular, basal-mid inferolateral wall.   2. Right ventricular systolic function is normal. The right ventricular  size is normal. Tricuspid regurgitation signal is inadequate for assessing  PA pressure.   3. Left atrial size was severely dilated.   4. The mitral valve is normal in structure. Trivial mitral valve  regurgitation. No evidence of mitral stenosis.   5. The aortic valve is tricuspid. Aortic valve regurgitation is not  visualized. Aortic valve sclerosis/calcification is present, without any  evidence of aortic stenosis.   6. The inferior vena cava is normal in size with greater than 50%  respiratory variability, suggesting right atrial pressure of 3 mmHg.   Echocardiogram 07/22/2021  1. Left ventricular ejection fraction, by estimation, is 40 to 45%. The  left ventricle has mildly decreased function. The left ventricle  demonstrates regional wall motion abnormalities (see scoring  diagram/findings for description). The left ventricular   internal cavity size was moderately dilated. There is moderate concentric  left ventricular hypertrophy. Left ventricular diastolic function could  not be evaluated. There is hypokinesis of the left ventricular, entire  inferolateral wall and inferior  wall.   2. Right ventricular systolic function is normal. The right ventricular  size is normal. There is mildly elevated pulmonary artery systolic  pressure. The estimated right ventricular systolic pressure is 35.3 mmHg.   3. Left atrial size was  severely dilated.   4. Right atrial size was severely dilated.   5. The mitral valve is normal in structure. Trivial mitral valve  regurgitation.   6. The aortic valve is tricuspid. Aortic valve regurgitation is not  visualized. Aortic valve sclerosis is present, with no evidence of aortic  valve stenosis.   7. There is mild dilatation of the ascending aorta, measuring 42 mm.   8. The inferior vena cava is normal in size with greater than 50%  respiratory variability, suggesting right atrial pressure of 3 mmHg.   Cardiac catheterization 02/08/2020 Mid RCA lesion is 95% stenosed. Mid Cx lesion is 99% stenosed. Lat 1st Diag lesion is 100% stenosed. Prox LAD to Mid LAD lesion is 50% stenosed. Mid LAD lesion is 50% stenosed. A drug-eluting stent was successfully placed using a SYNERGY XD 2.25X16. Post intervention, there is a 0% residual stenosis. A drug-eluting stent was successfully placed using a SYNERGY XD 2.25X24. Post intervention, there is a 0% residual stenosis. Diagnostic Dominance: Right  Intervention   Risk Assessment/Calculations:    CHA2DS2-VASc Score = 4   This indicates a 4.8% annual risk of stroke. The patient's score is based upon: CHF History: 1 HTN History: 1 Diabetes History: 0 Stroke History: 0 Vascular Disease History: 1 Age Score: 1 Gender Score: 0            Physical Exam:   VS:  BP (!) 138/90 (BP Location: Left Arm, Patient Position: Sitting, Cuff Size: Normal)   Pulse 99  Ht 5' 7 (1.702 m)   Wt 230 lb (104.3 kg)   BMI 36.02 kg/m    Wt Readings from Last 3 Encounters:  01/21/24 230 lb (104.3 kg)  04/01/23 219 lb (99.3 kg)  03/04/23 225 lb 6.4 oz (102.2 kg)    GEN: Well nourished, well developed in no acute distress NECK: No JVD; No carotid bruits CARDIAC: Irregularly irregular.  No murmurs, rubs, gallops RESPIRATORY:  Clear to auscultation without rales, wheezing or rhonchi  ABDOMEN: Soft, non-tender, non-distended EXTREMITIES: 1+  bilateral pedal edema; No acute deformity      Assessment and Plan:  Chronic HFmrEF Echocardiogram 09/2022 LVEF 40-45% - Today he is without orthopnea, PND, SOB at rest.  Weight has been stable since last office visit.  He does have chronic DOE and 1+ bilateral lower extremity edema, otherwise appears euvolemic on exam - DOE seems to have been more progressive over the past year.  Likely multifactorial given his LV dysfunction, OHS, deconditioning, tobacco use, and persistent AF - Will repeat echocardiogram today to reevaluate his LV function - H/o angioedema to ACEI so unable to start ARNI - Plan to increase his carvedilol  to 25 mg twice daily (HR 99 today) and start Jardiance 10 mg daily to further optimize his GDMT - Current GDMT: Carvedilol  25 mg twice daily, Jardiance 10 mg daily, furosemide  40 mg daily, spironolactone  50 mg daily, and nitroglycerin  as needed - Focus on weight loss and physical exercise - BMET today  Persistent atrial fibrillation Patient strongly prefers continued rate control rather than electrical or pharmacological cardioversion.  Not interested in EP referral at this time EKG today shows atrial fibrillation with heart rate of 99 He is tolerating anticoagulation without bleeding - Plan to increase carvedilol  to 25 mg twice daily to improve rate control - Continue Eliquis  5 mg twice daily, adequately dosed  Coronary artery disease S/p NSTEMI on 01/2020 with successful PCI/DES to mid RCA and mid circumflex.  He has moderate but noncritical LAD disease and small diagonal branch that appears to be occluded in the proximal LAD that was medically managed - Today patient is without anginal symptoms.  He remains fairly sedentary without exertional symptoms.  No indication for further ischemic evaluation at this - No ASA in the setting of DES greater than 1 year and anticoagulation for A-fib - Continue carvedilol  25 mg twice daily and atorvastatin  80 mg  daily  Hypertension Blood pressure today is 138/90 not well-controlled - Plan to increase carvedilol  to 25 mg twice daily and continue spironolactone  50 mg daily - Maintain home BP monitoring  Hyperlipidemia, LDL goal <70 LDL 71 on 03/2023 and borderline controlled - Plan to repeat fasting lipid panel at follow-up visit - Continue atorvastatin  80 mg daily - Work on heart healthy dieting and moderate intensity exercise  Dilation of ascending aorta CTA 06/2021 showed ascending thoracic aortic aneurysm measuring 4.1 cm - Will need a repeat CTA aorta at follow-up visit for routine monitoring      Dispo:  Return in about 3 months (around 04/22/2024).  Signed, Lum LITTIE Louis, NP

## 2024-01-21 NOTE — Patient Instructions (Signed)
 Medication Instructions:  INCREASE YOUR CARVEDILOL  TO 25 MG TWICE DAILY. START TAKING JARDIANCE 10 MG DAILY.  Lab Work: BMET TO BE DONE.  Testing/Procedures: Your physician has requested that you have an ECHOCARDIOGRAM. Echocardiography is a painless test that uses sound waves to create images of your heart. It provides your doctor with information about the size and shape of your heart and how well your heart's chambers and valves are working. This procedure takes approximately one hour. There are no restrictions for this procedure. Please do NOT wear cologne, perfume, aftershave, or lotions (deodorant is allowed). Please arrive 15 minutes prior to your appointment time.  Please note: We ask at that you not bring children with you during ultrasound (echo/ vascular) testing. Due to room size and safety concerns, children are not allowed in the ultrasound rooms during exams. Our front office staff cannot provide observation of children in our lobby area while testing is being conducted. An adult accompanying a patient to their appointment will only be allowed in the ultrasound room at the discretion of the ultrasound technician under special circumstances. We apologize for any inconvenience.   Follow-Up: At [facility], you and your health needs are our priority.  As part of our continuing mission to provide you with exceptional heart care, our providers are all part of one team.  This team includes your primary Cardiologist (physician) and Advanced Practice Providers or APPs (Physician Assistants and Nurse Practitioners) who all work together to provide you with the care you need, when you need it.  Your next appointment:   3 MONTHS  Provider:   Ozell Fell, MD

## 2024-01-22 ENCOUNTER — Ambulatory Visit: Payer: Self-pay | Admitting: Emergency Medicine

## 2024-01-22 LAB — BASIC METABOLIC PANEL WITH GFR
BUN/Creatinine Ratio: 14 (ref 10–24)
BUN: 13 mg/dL (ref 8–27)
CO2: 21 mmol/L (ref 20–29)
Calcium: 9.1 mg/dL (ref 8.6–10.2)
Chloride: 101 mmol/L (ref 96–106)
Creatinine, Ser: 0.95 mg/dL (ref 0.76–1.27)
Glucose: 131 mg/dL — ABNORMAL HIGH (ref 70–99)
Potassium: 3.7 mmol/L (ref 3.5–5.2)
Sodium: 139 mmol/L (ref 134–144)
eGFR: 86 mL/min/1.73 (ref >59)

## 2024-01-24 ENCOUNTER — Telehealth: Payer: Self-pay | Admitting: Cardiovascular Disease

## 2024-01-24 MED ORDER — POTASSIUM CHLORIDE CRYS ER 20 MEQ PO TBCR: 20.0000 meq | EXTENDED_RELEASE_TABLET | Freq: Every day | ORAL | 3 refills | Status: AC

## 2024-01-24 NOTE — Telephone Encounter (Signed)
*  STAT* If patient is at the pharmacy, call can be transferred to refill team.   1. Which medications need to be refilled? (please list name of each medication and dose if known) potassium chloride  SA (KLOR-CON  M) 20 MEQ tablet    2. Would you like to learn more about the convenience, safety, & potential cost savings by using the [facility]?     3. Are you open to using the [facility] (Type [facility]. ).   4. Which pharmacy/location (including street and city if local pharmacy) is medication to be sent to? [facility] - [place], [place] - [address]    5. Do they need a 30 day or 90 day supply? 90 day

## 2024-02-16 ENCOUNTER — Other Ambulatory Visit: Payer: Self-pay | Admitting: Cardiovascular Disease

## 2024-02-21 ENCOUNTER — Other Ambulatory Visit: Payer: Self-pay | Admitting: Cardiovascular Disease

## 2024-02-28 ENCOUNTER — Ambulatory Visit (HOSPITAL_COMMUNITY)

## 2024-03-30 NOTE — Progress Notes (Signed)
 QURON RUDDY                                          MRN: [id number]   03/30/2024   The [facility] Quality Team Specialist reviewed this patient medical record for the purposes of chart review for care gap closure. The following were reviewed: chart review for care gap closure-controlling blood pressure.    [facility] Quality Team

## 2024-05-08 ENCOUNTER — Encounter: Payer: Self-pay | Admitting: Emergency Medicine

## 2024-06-01 ENCOUNTER — Ambulatory Visit: Admitting: Cardiovascular Disease

## 2024-06-26 ENCOUNTER — Ambulatory Visit: Admitting: Family Medicine

## 2024-07-19 ENCOUNTER — Other Ambulatory Visit: Payer: Self-pay | Admitting: Cardiovascular Disease

## 2024-07-19 DIAGNOSIS — Z7901 Long term (current) use of anticoagulants: Secondary | ICD-10-CM

## 2024-07-19 DIAGNOSIS — I4819 Other persistent atrial fibrillation: Secondary | ICD-10-CM

## 2024-07-27 ENCOUNTER — Ambulatory Visit: Admitting: Family Medicine

## 2024-08-24 ENCOUNTER — Ambulatory Visit: Admitting: Family Medicine
